# Patient Record
Sex: Male | Born: 1935 | Race: White | Hispanic: No | Marital: Married | Smoking: Never smoker
Health system: Southern US, Community
[De-identification: ages and names within clinical notes are randomized; demographics above are authoritative.]

## PROBLEM LIST (undated history)

## (undated) DIAGNOSIS — I10 Essential (primary) hypertension: Secondary | ICD-10-CM

## (undated) DIAGNOSIS — I2510 Atherosclerotic heart disease of native coronary artery without angina pectoris: Secondary | ICD-10-CM

## (undated) DIAGNOSIS — K4020 Bilateral inguinal hernia, without obstruction or gangrene, not specified as recurrent: Principal | ICD-10-CM

## (undated) DIAGNOSIS — E46 Unspecified protein-calorie malnutrition: Secondary | ICD-10-CM

## (undated) DIAGNOSIS — N259 Disorder resulting from impaired renal tubular function, unspecified: Secondary | ICD-10-CM

## (undated) DIAGNOSIS — J189 Pneumonia, unspecified organism: Secondary | ICD-10-CM

## (undated) DIAGNOSIS — I214 Non-ST elevation (NSTEMI) myocardial infarction: Secondary | ICD-10-CM

## (undated) DIAGNOSIS — E119 Type 2 diabetes mellitus without complications: Secondary | ICD-10-CM

## (undated) DIAGNOSIS — E785 Hyperlipidemia, unspecified: Secondary | ICD-10-CM

## (undated) DIAGNOSIS — A0472 Enterocolitis due to Clostridium difficile, not specified as recurrent: Secondary | ICD-10-CM

## (undated) DIAGNOSIS — M1990 Unspecified osteoarthritis, unspecified site: Secondary | ICD-10-CM

## (undated) HISTORY — DX: Disorder resulting from impaired renal tubular function, unspecified: N25.9

## (undated) HISTORY — PX: CORONARY ARTERY BYPASS GRAFT: SHX141

## (undated) HISTORY — PX: CATARACT EXTRACTION W/ INTRAOCULAR LENS  IMPLANT, BILATERAL: SHX1307

## (undated) HISTORY — DX: Hyperlipidemia, unspecified: E78.5

## (undated) HISTORY — PX: APPENDECTOMY: SHX54

## (undated) HISTORY — DX: Essential (primary) hypertension: I10

## (undated) HISTORY — PX: SCROTAL SURGERY: SHX2387

## (undated) HISTORY — PX: TONSILLECTOMY: SUR1361

## (undated) HISTORY — DX: Atherosclerotic heart disease of native coronary artery without angina pectoris: I25.10

## (undated) HISTORY — DX: Bilateral inguinal hernia, without obstruction or gangrene, not specified as recurrent: K40.20

---
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
## 2008-05-08 ENCOUNTER — Ambulatory Visit: Payer: Self-pay | Admitting: Cardiology

## 2008-05-13 ENCOUNTER — Ambulatory Visit: Payer: Self-pay | Admitting: Cardiology

## 2008-05-14 ENCOUNTER — Inpatient Hospital Stay (HOSPITAL_COMMUNITY): Admission: AD | Admit: 2008-05-14 | Discharge: 2008-05-16 | Payer: Self-pay | Admitting: Cardiology

## 2008-05-21 ENCOUNTER — Ambulatory Visit: Payer: Self-pay | Admitting: Cardiology

## 2008-05-21 ENCOUNTER — Inpatient Hospital Stay (HOSPITAL_COMMUNITY): Admission: EM | Admit: 2008-05-21 | Discharge: 2008-05-23 | Payer: Self-pay | Admitting: Cardiology

## 2008-06-11 ENCOUNTER — Ambulatory Visit: Payer: Self-pay | Admitting: Cardiology

## 2008-06-11 LAB — CONVERTED CEMR LAB
BUN: 43 mg/dL — ABNORMAL HIGH (ref 6–23)
CO2: 25 meq/L (ref 19–32)
GFR calc non Af Amer: 28 mL/min
Glucose, Bld: 117 mg/dL — ABNORMAL HIGH (ref 70–99)

## 2008-06-26 HISTORY — PX: CORONARY ANGIOPLASTY WITH STENT PLACEMENT: SHX49

## 2008-07-28 ENCOUNTER — Ambulatory Visit: Payer: Self-pay | Admitting: Cardiology

## 2008-12-11 DIAGNOSIS — E119 Type 2 diabetes mellitus without complications: Secondary | ICD-10-CM

## 2008-12-11 DIAGNOSIS — I10 Essential (primary) hypertension: Secondary | ICD-10-CM

## 2008-12-11 DIAGNOSIS — I2510 Atherosclerotic heart disease of native coronary artery without angina pectoris: Secondary | ICD-10-CM

## 2008-12-11 DIAGNOSIS — N259 Disorder resulting from impaired renal tubular function, unspecified: Secondary | ICD-10-CM

## 2008-12-11 DIAGNOSIS — E785 Hyperlipidemia, unspecified: Secondary | ICD-10-CM

## 2008-12-15 ENCOUNTER — Ambulatory Visit: Payer: Self-pay | Admitting: Cardiology

## 2008-12-17 ENCOUNTER — Telehealth (INDEPENDENT_AMBULATORY_CARE_PROVIDER_SITE_OTHER): Payer: Self-pay | Admitting: *Deleted

## 2009-01-07 ENCOUNTER — Encounter: Payer: Self-pay | Admitting: Cardiology

## 2009-01-12 ENCOUNTER — Encounter: Payer: Self-pay | Admitting: Cardiology

## 2009-03-12 ENCOUNTER — Encounter (INDEPENDENT_AMBULATORY_CARE_PROVIDER_SITE_OTHER): Payer: Self-pay | Admitting: *Deleted

## 2009-04-29 ENCOUNTER — Encounter: Payer: Self-pay | Admitting: Cardiology

## 2009-04-30 ENCOUNTER — Encounter: Payer: Self-pay | Admitting: Cardiology

## 2009-05-04 ENCOUNTER — Ambulatory Visit: Payer: Self-pay | Admitting: Cardiology

## 2009-05-05 ENCOUNTER — Telehealth: Payer: Self-pay | Admitting: Cardiology

## 2009-05-06 ENCOUNTER — Encounter: Payer: Self-pay | Admitting: Cardiology

## 2009-06-30 ENCOUNTER — Telehealth: Payer: Self-pay | Admitting: Cardiology

## 2009-11-01 ENCOUNTER — Ambulatory Visit: Payer: Self-pay | Admitting: Cardiology

## 2010-02-09 ENCOUNTER — Encounter: Payer: Self-pay | Admitting: Cardiology

## 2010-02-24 ENCOUNTER — Encounter: Payer: Self-pay | Admitting: Cardiovascular Disease

## 2010-02-25 ENCOUNTER — Encounter: Payer: Self-pay | Admitting: Cardiovascular Disease

## 2010-02-25 ENCOUNTER — Ambulatory Visit: Payer: Self-pay

## 2010-06-13 ENCOUNTER — Encounter: Payer: Self-pay | Admitting: Cardiology

## 2010-06-16 ENCOUNTER — Encounter: Payer: Self-pay | Admitting: Cardiology

## 2010-06-21 ENCOUNTER — Encounter: Payer: Self-pay | Admitting: Cardiology

## 2010-06-21 ENCOUNTER — Ambulatory Visit: Payer: Self-pay | Admitting: Cardiology

## 2010-07-28 NOTE — Miscellaneous (Signed)
  Clinical Lists Changes  Observations: Added new observation of RENAL USOUND: NOrmal caliber abdominal aorta Normal bilateral kidney size: right is smaller than previous study, and left is stable. Small right lower pole kidney cyst. No evidence of hydonephrosis is noted in the kidneys Normal renal arteries, bilaterally (02/25/2010 9:21)      Renal US  Procedure date:  02/25/2010  Findings:      NOrmal caliber abdominal aorta Normal bilateral kidney size: right is smaller than previous study, and left is stable. Small right lower pole kidney cyst. No evidence of hydonephrosis is noted in the kidneys Normal renal arteries, bilaterally

## 2010-07-28 NOTE — Letter (Signed)
Summary: [facility]   Imported By: Marylou Mccoy 07/13/2010 16:32:33  _____________________________________________________________________  External Attachment:    Type:   Image     Comment:   External Document

## 2010-07-28 NOTE — Letter (Signed)
Summary: [facility] Records   Imported By: Kassie Mends 06/29/2009 08:52:04  _____________________________________________________________________  External Attachment:    Type:   Image     Comment:   External Document

## 2010-07-28 NOTE — Miscellaneous (Signed)
Summary: Orders Update  Clinical Lists Changes  Orders: Added new Service order of EKG w/ Interpretation (93000) - Signed 

## 2010-07-28 NOTE — Consult Note (Signed)
Summary: [facility]  [facility]   Imported By: Marylou Mccoy 07/10/2009 10:00:02  _____________________________________________________________________  External Attachment:    Type:   Image     Comment:   External Document

## 2010-07-28 NOTE — Miscellaneous (Signed)
  Clinical Lists Changes  Observations: Added new observation of PI CARDIO: Your physician recommends that you schedule a follow-up appointment in: 6 months with Dr Antoine Poche Your physician recommends that you continue on your current medications as directed. Please refer to the Current Medication list given to you today. (11/01/2009 16:37)      Patient Instructions: 1)  Your physician recommends that you schedule a follow-up appointment in: 6 months with Dr Antoine Poche 2)  Your physician recommends that you continue on your current medications as directed. Please refer to the Current Medication list given to you today.

## 2010-07-28 NOTE — Progress Notes (Signed)
Summary: QUESTION ABOUT HAVING DENTAL WORK   Phone Note Call from Patient Call back at Home Phone [phone]   Caller: Spouse/ANNE Summary of Call: PT HAVING DENTAL WORK AND WANT TO KNOW IF THE PT NEEDS MEDICATION BEFORE DENTAL WORK. Initial call taken by: Judie Grieve,  June 30, 2009 10:24 AM  Follow-up for Phone Call        No SBE needed, pt aware Follow-up by: Charolotte Capuchin, RN,  June 30, 2009 10:31 AM

## 2010-07-28 NOTE — Miscellaneous (Signed)
Summary: Orders Update  Clinical Lists Changes  Orders: Added new Test order of Renal Artery Duplex (Renal Artery Duplex) - Signed 

## 2010-07-28 NOTE — Assessment & Plan Note (Signed)
Summary: 6 month rov/sl      Allergies Added: NKDA  Visit Type:  Follow-up Primary Provider:  Dr. Omer Jack  CC:  CAD.  History of Present Illness: The patient presents for followup. Since I last saw him he has had no new cardiovascular complaints. He does occasionally get some chest discomfort when he first pushes his lawnmower. However, this is sporadic and not always reproducible with this activity. He can use a weedeater and I get these symptoms. He can pedal his bicycle for 20 minutes getting his heart rate elevated and does not bring on the symptoms. He has on rare occasions stopped pushing a lawnmower and taken a nitroglycerin. He said he otherwise feels well. He's not having any resting symptoms. He's having no palpitations, presyncope or syncope. He's had no PND or orthopnea.  Current Medications (verified): 1)  Imdur 120 Mg Xr24h-Tab (Isosorbide Mononitrate) .... Daily 2)  Aspirin 325 Mg  Tabs (Aspirin) .... Daily 3)  Fish Oil   Oil (Fish Oil) .... Daily 4)  Cod Liver Oil   Oil (Cod Liver Oil) .... Daily 5)  Flomax 0.4 Mg Xr24h-Cap (Tamsulosin Hcl) .... Daily 6)  Plavix 75 Mg Tabs (Clopidogrel Bisulfate) .... Daily 7)  Crestor 5 Mg Tabs (Rosuvastatin Calcium) .... Daily 8)  Icaps Lutein-Zeaxanthin  Cr-Tabs (Specialty Vitamins Products) .... Once Daily 9)  Cinnamon 500 Mg Caps (Cinnamon) .... Once Daily 10)  Metoprolol Tartrate 100 Mg Tabs (Metoprolol Tartrate) .... One Tablet Every Am and 1/2 Tablet Every Pm  Allergies (verified): No Known Drug Allergies  Past History:  Past Medical History: Coronary artery disease (the patient had a   previous CABG in 1997 with a LIMA to the LAD, SVG to OM and SVG to PDA.   Last catheterization was by Dr. Juanda Chance in November.  He had an LAD,   which was occluded.  The circumflex demonstrated occlusion of a second   posterolateral branch 90% followed by 80% stenosis in a large   posterolateral branch, 50% stenosis of the first marginal.   The right   coronary artery had a 90% stenosis extending from the ostium to the   posterior branch.  The saphenous vein graft to the right coronary artery   was occluded at the origin.  The LIMA to the LAD was patent.  The vein   graft to the circumflex was occluded.  EF was 58%.  The patient had a   PROMUS drug eluting stent of the circumflex.), chronic renal   insufficiency (probably related to diabetes), hyperlipidemia,   hypertension, type 2 diabetes mellitus  Hernia - (groin area)  Past Surgical History: Reviewed history from 12/15/2008 and no changes required. Appendectomy CABG  Review of Systems       As stated in the HPI and negative for all other systems.   Vital Signs:  Patient profile:   77 year old male Height:      68 inches Weight:      180 pounds BMI:     27.47 Pulse rate:   66 / minute Resp:     16 per minute BP sitting:   134 / 80  (right arm)  Vitals Entered By: Marrion Coy, CNA (Nov 01, 2009 3:37 PM)  Physical Exam  General:  Well developed, well nourished, in no acute distress. Head:  normocephalic and atraumatic Eyes:  PERRLA/EOM intact; conjunctiva and lids normal. Neck:  Neck supple, no JVD. No masses, thyromegaly or abnormal cervical nodes. Chest Wall:  well healed  sternotomy scar Lungs:  Clear bilaterally to auscultation and percussion. Abdomen:  Bowel sounds positive; abdomen soft and non-tender without masses, organomegaly, or hernias noted. No hepatosplenomegaly. Msk:  Back normal, normal gait. Muscle strength and tone normal. Extremities:  No clubbing or cyanosis. Neurologic:  Alert and oriented x 3. Skin:  Intact without lesions or rashes. Cervical Nodes:  no significant adenopathy Inguinal Nodes:  no significant adenopathy Psych:  Normal affect.   Detailed Cardiovascular Exam  Neck    Carotids: Carotids full and equal bilaterally without bruits.      Neck Veins: Normal, no JVD.    Heart    Inspection: no deformities or lifts  noted.      Palpation: normal PMI with no thrills palpable.      Auscultation: regular rate and rhythm, S1, S2 without murmurs, rubs, gallops, or clicks.    Vascular    Abdominal Aorta: no palpable masses, pulsations, or audible bruits.      Femoral Pulses: normal femoral pulses bilaterally.      Pedal Pulses: normal pedal pulses bilaterally.      Radial Pulses: normal radial pulses bilaterally.      Peripheral Circulation: no clubbing, cyanosis, or edema noted with normal capillary refill.     EKG  Procedure date:  11/01/2009  Findings:      sinus rhythm, rate 66, left axis deviation, early transition in lead V2 questionable lead placement, no acute ST-T wave changes  Impression & Recommendations:  Problem # 1:  CAD (ICD-414.00) The patient has some atypical chest discomfort. If this is angina it is exertional and stable. He otherwise is doing quite well. I would continue with medical management.  Problem # 2:  HYPERTENSION (ICD-401.9) His blood pressure is well controlled. He will continue the meds as listed.  Problem # 3:  HYPERLIPIDEMIA (ICD-272.4) He reports that his cholesterol was recently well controlled and he will try to send these results to me. We have discussed the goal LDL less than 100 and HDL greater than 40.

## 2010-07-28 NOTE — Assessment & Plan Note (Signed)
Summary: 6 month 414.01  pfh,rn  Medications Added METOPROLOL TARTRATE 100 MG TABS (METOPROLOL TARTRATE) 1/2 by mouth two times a day      Allergies Added: NKDA  Visit Type:  Follow-up Primary Provider:  Dr. Mikey Bussing  CC:  CAD.  History of Present Illness: The patient presents for followup of his known coronary disease. Since I last saw him he has had no new cardiovascular complaints. He still gets episodes when he is lifting something light refill suddenly feels fatigued and short of breath. He might take a nitroglycerin and symptoms resolved over about 5 minutes. This is not sporadic and is not reproducible only when he lifts light objects. He has not had this when he carries heavy objects when he fills his stationary bicycle. He denies any chest pressure, neck or arm discomfort. He has no new PND or orthopnea. He has no palpitations, presyncope or syncope.  Current Medications (verified): 1)  Imdur 120 Mg Xr24h-Tab (Isosorbide Mononitrate) .... Daily 2)  Aspirin 325 Mg  Tabs (Aspirin) .... Daily 3)  Fish Oil   Oil (Fish Oil) .... Daily 4)  Cod Liver Oil   Oil (Cod Liver Oil) .... Daily 5)  Flomax 0.4 Mg Xr24h-Cap (Tamsulosin Hcl) .... Daily 6)  Plavix 75 Mg Tabs (Clopidogrel Bisulfate) .... Daily 7)  Crestor 5 Mg Tabs (Rosuvastatin Calcium) .... Daily 8)  Icaps Lutein-Zeaxanthin  Cr-Tabs (Specialty Vitamins Products) .... Once Daily 9)  Cinnamon 500 Mg Caps (Cinnamon) .... Once Daily 10)  Metoprolol Tartrate 100 Mg Tabs (Metoprolol Tartrate) .... 1/2 By Mouth Two Times A Day 11)  Nitrostat 0.4 Mg Subl (Nitroglycerin) .Marland Kitchen.. 1 Tablet Under Tongue At Onset of Chest Pain; You May Repeat Every 5 Minutes For Up To 3 Doses.  Allergies (verified): No Known Drug Allergies  Past History:  Past Medical History: Reviewed history from 11/01/2009 and no changes required. Coronary artery disease (the patient had a   previous CABG in 1997 with a LIMA to the LAD, SVG to OM and SVG to PDA.   Last catheterization was by Dr. Juanda Chance in November.  He had an LAD,   which was occluded.  The circumflex demonstrated occlusion of a second   posterolateral branch 90% followed by 80% stenosis in a large   posterolateral branch, 50% stenosis of the first marginal.  The right   coronary artery had a 90% stenosis extending from the ostium to the   posterior branch.  The saphenous vein graft to the right coronary artery   was occluded at the origin.  The LIMA to the LAD was patent.  The vein   graft to the circumflex was occluded.  EF was 58%.  The patient had a   PROMUS drug eluting stent of the circumflex.), chronic renal   insufficiency (probably related to diabetes), hyperlipidemia,   hypertension, type 2 diabetes mellitus  Hernia - (groin area)  Past Surgical History: Reviewed history from 12/15/2008 and no changes required. Appendectomy CABG  Review of Systems       Pain from a inguinal hernia. Otherwise as stated in the history of present illness negative for all other systems.  Vital Signs:  Patient profile:   77 year old male Height:      68 inches Weight:      181 pounds BMI:     27.62 Pulse rate:   68 / minute Resp:     16 per minute BP sitting:   124 / 70  (right arm)  Vitals Entered  By: Marrion Coy, CNA (June 21, 2010 3:08 PM)  Physical Exam  General:  Well developed, well nourished, in no acute distress. Head:  normocephalic and atraumatic Neck:  Neck supple, no JVD. No masses, thyromegaly or abnormal cervical nodes. Chest Wall:  well healed sternotomy scar Lungs:  Clear bilaterally to auscultation and percussion. Heart:  Non-displaced PMI, chest non-tender; regular rate and rhythm, S1, S2 without murmurs, rubs or gallops. Carotid upstroke normal, no bruit. Normal abdominal aortic size, no bruits. Femorals normal pulses, no bruits. Pedals normal pulses. No edema, no varicosities. Abdomen:  Bowel sounds positive; abdomen soft and non-tender without masses,  organomegaly, or hernias noted. No hepatosplenomegaly. Msk:  Back normal, normal gait. Muscle strength and tone normal. Extremities:  No clubbing or cyanosis. Neurologic:  Alert and oriented x 3. Skin:  Intact without lesions or rashes. Cervical Nodes:  no significant adenopathy Axillary Nodes:  no significant adenopathy Inguinal Nodes:  no significant adenopathy Psych:  Normal affect.   EKG  Procedure date:  06/21/2010  Findings:      Sinus rhythm, rate 68, left axis deviation, intervals within normal limits, no acute ST-T wave changes.  Impression & Recommendations:  Problem # 1:  CAD (ICD-414.00) At this point I do not have any strong suspicion that he has new vascular disease or new unstable angina. He will continue with medical management and risk reduction. Orders: EKG w/ Interpretation (93000)  Problem # 2:  HYPERTENSION (ICD-401.9) His blood pressure is well controlled. We will continue with the meds as listed.  Problem # 3:  RENAL INSUFFICIENCY (ICD-588.9) This is stable apparently and followed by nephrology.  Problem # 4:  HYPERLIPIDEMIA (ICD-272.4) This is followed by his primary physician. The goal is LDL less than 70 and HDL greater than 40.  Patient Instructions: 1)  Your physician recommends that you schedule a follow-up appointment in: 6 months with Dr Antoine Poche 2)  Your physician recommends that you continue on your current medications as directed. Please refer to the Current Medication list given to you today. 3)  Dr Avel Peace  [phone]

## 2010-07-28 NOTE — Letter (Signed)
Summary: [facility] Patient Note   [facility] Patient Note   Imported By: Roderic Ovens 03/25/2010 11:22:13  _____________________________________________________________________  External Attachment:    Type:   Image     Comment:   External Document

## 2010-11-08 NOTE — Discharge Summary (Signed)
NAMERENLEY, BANWART NO.:  [id number]   MEDICAL RECORD NO.:  [id number]          PATIENT TYPE:  INP   LOCATION:  6526                         FACILITY:  [facility]   PHYSICIAN:  Duke Salvia, MD, FACCDATE OF BIRTH:  [birth year]   DATE OF ADMISSION:  05/21/2008  DATE OF DISCHARGE:  05/23/2008                               DISCHARGE SUMMARY   PRIMARY CARDIOLOGIST:  [name], MD, [facility]   PRIMARY CARE Mads Borgmeyer:  Dr. Wallace Cullens   DISCHARGE DIAGNOSES:  Unstable angina/coronary artery disease.   SECONDARY DIAGNOSES:  1. Hypertension.  2. Hyperlipidemia.  3. Type 2 diabetes mellitus (diet controlled).  4. Status post appendectomy.   ALLERGIES:  No known drug allergies.   PROCEDURES:  Successful percutaneous coronary intervention and stenting  of the native left circumflex with placement of a 2.5 x 20 mm PROMUS  drug-eluting stent and a 2.75 x 8 mm PROMUS drug-eluting stent.   HISTORY OF PRESENT ILLNESS:  This is a 77 year old Caucasian male with  prior history of CAD status post CABG x3 in 1997 who recently began to  experience episodes of overwhelming fatigue, which he identified as his  anginal equivalent.  He subsequently followed up with Dr. Omer Jack and  underwent stress testing revealing inferior ischemia with no LV  function.  He then saw Dr. Antoine Poche and underwent cardiac  catheterization on May 15, 2008, revealing only 1/3 patent grafts  (the LIMA to the LAD) with, otherwise, severe native vessel disease,  specifically involving the PDA and a large posterolateral branch of the  left circumflex.  The posterolateral branch of the left circumflex was  felt to be the most amenable to PCI.  Because of the patient's renal  dysfunction, it was felt that a stage procedure was the best approach  and the patient was discharged on May 16, 2008, and readmitted on  May 21, 2008.   HOSPITAL COURSE:  Following admission, Mr. Consalvo was  aggressively  hydrated in order to prevent contrast-induced nephropathy.  He was taken  to the Cath Lab on May 22, 2008, and underwent successful PCI and  stenting of the mid and distal left circumflex with placement of 2  PROMUS drug-eluting stents.  The patient tolerated the procedure well,  and postprocedure, his creatinine is down to 2.16.  He has been  ambulating without recurrent symptoms or limitation.  He will be  discharged home today in good condition.   DISCHARGE LABORATORY DATA:  Hemoglobin 11.8, hematocrit 34.5, WBCs 6.9,  platelets 185.  Sodium 138, potassium 4.5, chloride 109, CO2 21, BUN 28,  creatinine 2.16, glucose 108, total bilirubin 0.6.  Alkaline phosphatase  253, AST 34, ALT 37, total protein 4.4, albumin 1.2, calcium 8.9.   DISPOSITION:  The patient will be discharged home today in good  condition.   FOLLOWUP PLANS AND APPOINTMENTS:  We will arrange for followup with Dr.  Antoine Poche in approximately 2 weeks.  We have asked him to follow up with  Dr. Omer Jack in 1 week for a repeat BMET.   DISCHARGE MEDICATIONS:  1. Aspirin 325 mg daily.  2. Plavix 75  mg daily.  3. Lopressor 25 mg b.i.d.  4. Crestor 5 mg daily.  5. Flomax 0.4 mg daily.  6. Imdur 120 mg daily.  7. Nitroglycerin 0.4 mg sublingual p.r.n. chest pain.  8. Micardis 80 mg daily.   OUTSTANDING LABORATORY STUDIES:  A BMET in 1 week.   DURATION OF DISCHARGE ENCOUNTER:  60 minutes including physician time.      Nicolasa Ducking, ANP      Duke Salvia, MD, [facility]  Electronically Signed    CB/MEDQ  D:  05/23/2008  T:  05/23/2008  Job:  [id number]

## 2010-11-08 NOTE — Cardiovascular Report (Signed)
NAMELAN, MCNEILL            ACCOUNT NO.:  [id number]   MEDICAL RECORD NO.:  [id number]          PATIENT TYPE:  INP   LOCATION:  2039                         FACILITY:  [facility]   PHYSICIAN:  Bruce R. Juanda Chance, MD, FACCDATE OF BIRTH:  1927-10-02   DATE OF PROCEDURE:  05/15/2008  DATE OF DISCHARGE:                            CARDIAC CATHETERIZATION   PAST MEDICAL HISTORY:  Mr. Osten is a 77 year old and had bypass  surgery in 1997.  Recently, he developed symptoms of exertional fatigue  and had a Myoview scan, which showed inferior ischemia.  He has chronic  renal insufficiency.  He has creatinine that ranges 2.3-2.6.  It was 2.3  this morning after hydration.  He was seen in consultation by Renal  Service prior to the cath today.   PROCEDURE:  The procedure was performed by the right femoral arterial  sheath and 5-French preformed coronary catheters.  A front wall arterial  puncture with a following Omnipaque contrast was used.  A LIMA catheter  was used for injection of LIMA graft.  No left ventriculogram was  performed.  We made every attempt to minimize contrast and the total  contrast used was 42 mL.   RESULTS:  The left main coronary artery was free of significant disease.   Left anterior descending artery.  Please note that left descending  artery gave rise to a septal perforator and a diagonal branch.  Vessel  was then completely occluded.   The circumflex artery:  The circumflex artery gave rise to a first  marginal branch and then a large posterolateral branch.  There may have  been occlusion of a second posterolateral branch since this was filled  via collaterals from the right coronary artery.  There was a 90% and 80%  tandem lesions in the large posterolateral branch.  There was 50% ostial  narrowing in the first marginal branch.   The right coronary artery:  Right coronary artery is moderately large  vessel, gave rise to 2 right ventricle branch, a posterior  descending  branch, and 2 posterolateral branches.  There was a long area of 90%  stenosis extending from the ostium to about two-thirds the way down the  posterior branch.  There was 50% narrowing in the distal right coronary  artery just before the 2 posterolateral branches and the caliber vessel  change at that point to a smaller caliber vessel.   The saphenous vein graft of the right coronary artery was simply  occluded near its origin.  I could not be certain if this was an old or  recent occlusion.   The circumflex artery:  The circumflex artery was completely occluded at  its origin.  This look to be an old occlusion.   The LIMA graft to the LAD was patent and functioning normally.  This  also filled the diagonal branch.  No left ventriculogram was performed.   The aortic pressure was 101/49 with mean of 69.  Left neck pressure is  101/8.   CONCLUSION:  1. Coronary artery, he is status post coronary bypass graft surgery in      1997.  2. Severe native vessel disease with total occlusion left anterior      descending artery, 50% stenosis in the first marginal branch of the      circumflex artery and 90% and 80% tandem stenoses in the large      posterolateral branch and complete occlusion of the very distal      circumflex artery.  3. A 40% narrowing in the distal right coronary artery with long area      of 90% stenosis in the posterior descending branch of the right      coronary artery.  4. Occluded vein graft to the right coronary, occluded vein graft to      circumflex artery, and patent LIMA graft to the LAD.  5. Ejection fraction of 58% by recent Myoview scan.   RECOMMENDATIONS:  The patient has a creatinine of 2.3.  We used 42 mL of  contrast.  The decision about intervention was difficult.  I think, the  posterior descending lesion is not very favorable for intervention and  also it might require a large amount of contrast to fix.  The tandem  lesions in the  large posterolateral branch of circumflex artery, I  think, could be flexible with a long drug-eluting stent.  We might  consider this and so I think this can be done without very much  contrast.  I will discuss these findings with Dr. Antoine Poche and then we  will make a decision about how to proceed.      Bruce Elvera Lennox Juanda Chance, MD, [facility]  Electronically Signed     BRB/MEDQ  D:  05/15/2008  T:  05/16/2008  Job:  [id number]   cc:   [name], MD, [facility]  CardioPulmonary Laboratory  Wallace Cullens

## 2010-11-08 NOTE — Assessment & Plan Note (Signed)
[facility]                            CARDIOLOGY OFFICE NOTE   NAME:[name] [name], [name] [name]                     MRN:          [id number]  DATE:06/11/2008                            DOB:          Dec 14, 1927    PRIMARY CARE PHYSICIAN:  Wallace Cullens, MD   REASON FOR PRESENTATION:  Evaluate the patient with coronary artery  disease.   HISTORY OF PRESENT ILLNESS:  The patient presents for followup after  recent angioplasty stent.  He had this done by Dr. Juanda Chance.  I cathed him  for evaluation of an abnormal Cardiolite demonstrating inferior  ischemia.  He also had some dyspnea and chest pain.  This  catheterization demonstrated the LAD was occluded, the circumflex had an  occlusion of the second posterolateral branch, 90% followed by 80%  lesion in a large posterolateral branch, 50% stenosis of first marginal.  The right coronary artery had a long area of 90% stenosis extending from  the ostium, the posterior branch.  Saphenous vein graft to the right  coronary artery was occluded at the origin.  Circumflex or the LIMA to  the LAD was patent.  The vein graft to the circumflex was occluded.  The  EF was 58%.  Because of renal insufficiency, we staged his procedure.  He came back and had PCI of mid circumflex lesion with a PROMUS drug-  eluting stent and PCI of the circumflex ostial lesion with PROMUS drug-  eluting stent.  We followed his creatinine, which has been slightly  elevated from his baseline.   Since going home he has had no new problems.  He has had none of the  significant fatigue that he was having.  He still getting some mild  chest discomfort going up and down stairs.  He is not having any resting  chest discomfort.  He is not having any new shortness of breath.  He is  not describing any PND or orthopnea.  He has had no palpitation,  presyncope, or syncope.  He has been bothered by a head cold.   PAST MEDICAL HISTORY:  Coronary artery disease  as described above,  chronic renal insufficiency (probably related to diabetes,  hyperlipidemia, and hypertension), hypertension, hyperlipidemia, type 2  diabetes mellitus, and appendectomy.   ALLERGIES AND INTOLERANCES:  None.   MEDICATIONS:  1. Aspirin 325 mg daily.  2. Fish oil.  3. Cod liver oil.  4. Flomax 0.4 mg daily.  5. Plavix 75 mg daily.  6. Crestor 5 mg daily.  7. Imdur 120 mg daily.  8. Metoprolol 25 mg b.i.d.  9. Micardis is on hold.   REVIEW OF SYSTEMS:  As stated in the HPI, otherwise, negative for other  systems except for upper respiratory cold.   PHYSICAL EXAMINATION:  GENERAL:  The patient is in no distress.  VITAL SIGNS:  Blood pressure 144/80, heart rate 81 and regular, weight  181 pounds, and body mass index 27.  HEENT:  Eyes are unremarkable, pupils equal, round, and reactive to  light, fundi not visualized, oral mucosa unremarkable.  NECK:  No jugular venous distention at 45 degrees,  carotid upstroke  brisk and symmetrical.  No bruits or thyromegaly.  LYMPHATICS:  No cervical, axillary, or inguinal adenopathy.  LUNGS:  Clear to auscultation bilaterally.  BACK:  No costovertebral angle tenderness.  CHEST:  Unremarkable.  HEART:  PMI not displaced or sustained, S1 and S2 within normal limits,  no S3, no S4, no clicks, rubs, no murmurs.  ABDOMEN:  Flat, positive bowel sounds normal in frequency and pitch, no  bruits, no rebound, no guarding, no midline pulsatile mass, no  hepatomegaly, no splenomegaly.  SKIN:  No rashes,  no nodules.  EXTREMITIES:  2+ pulses, no edema.   EKG sinus rhythm, rate 81, axis within normal limits, intervals within  normal limits, no acute ST-wave changes.   ASSESSMENT AND PLAN:  1. Coronary artery disease.  The patient had percutaneous coronary      intervention to the circumflex as described.  He does have some      discomfort.  His fatigue is improved.  I doubt that he has had      restenosis based on his symptoms.  I  would have a very high      threshold to do another catheterization on this gentleman with his      renal insufficiency.  We will continue to manage him medically and      with risk reduction.  2. Renal insufficiency, we will check creatinine today.  I am going to      hold his Micardis.  We will set him up to follow long-term with one      of our nephrologists.  3. Diabetes per Dr. Omer Jack.  4. Dyslipidemia per Dr. Omer Jack.  I will be happy to follow this as      well.  The goal will be an LDL less than 70 and HDL greater than      40.  5. Hypertension.  Blood pressure is slightly elevated.  I am going to      hope to restart Micardis if his creatinine is stable at some point      in time, if not, we will titrate his other meds as tolerated and      indicated.  6. Followup.  I am going to see the patient back in 6 weeks or sooner      if needed.     [name], MD, [facility]  Electronically Signed    JH/MedQ  DD: 06/11/2008  DT: 06/11/2008  Job #: [id number]   cc:   Wallace Cullens

## 2010-11-08 NOTE — H&P (Signed)
NAME:  [name] [name], [name] [name] NO.:  [id number]   MEDICAL RECORD NO.:  [id number]          PATIENT TYPE:  INP   LOCATION:  3736                         FACILITY:  [facility]   PHYSICIAN:  [name], MD, FACCDATE OF BIRTH:  13-May-1928   DATE OF ADMISSION:  05/21/2008  DATE OF DISCHARGE:                              HISTORY & PHYSICAL   PRIMARY CARDIOLOGY:  [name], MD, [facility]   PRIMARY CARE Jenea Dake:  Dr. Wallace Cullens   PROFILE:  A 77 year old married Caucasian male with prior history of  CABG and CAD who presents for PCI tomorrow.   PROBLEMS:  1. Unstable angina/coronary artery disease.      a.     CABG x3 in 1997 with placement of a LIMA to the LAD, vein       graft to the RCA, and vein graft to the left circumflex.      b.     November 2009, exercise Myoview showing EF of 58% with       inferior ischemia.      c.     May 15, 2008, cardiac catheterization left main normal,       LAD 100%, large posterolateral branch of 90 and 80% tandem       stenoses.  OM1 50%, RCA 50%, distal PDA 90%, ostial to mid, vein       graft to the RCA was occluded.  Vein graft to the circumflex was       occluded.  LIMA to the LAD was patent.  2. Hypertension.  3. Hyperlipidemia.  4. Type 2 diabetes mellitus.  5. Status post appendectomy.   HISTORY OF PRESENT ILLNESS:  A 77 year old Caucasian male with history  of CAD status post CABG in 1997.  Recently began to experience episodes  of overwhelming fatigue which he identifies as an anginal equivalent.  He underwent Myoview stress testing work by Dr. Omer Jack revealing  inferior ischemia with normal LV function.  Subsequently, he saw Dr.  Antoine Poche in the office, May 08, 2008 and was sent for cardiac  catheterization.  Catheterization which was performed on May 15, 2008, showed only one of three patent grafts (the LIMA to the LAD) and  severe native vessel disease.  He had significant disease in the PDA, as  well  as the left posterolateral branch.  It was felt that the PDA was  the most amenable to PCI.  Notably, the patient also has history of  chronic kidney disease and for that reason, PCI was not performed  following catheterization, May 15, 2008, and instead the patient  was discharged to home.  His Micardis has been held since discharge and  he presents back this evening for hydration with plans for PCI of the  PDA tomorrow.  Since discharge, [name] [name] has had one additional  episode of overwhelming fatigue that occurred when he was walking out to  get the newspaper.  This was short-lived.   ALLERGIES:  No known drug allergies.   HOME MEDICATIONS:  1. Plavix 75 mg daily.  2. Aspirin 325 mg daily.  3. Lopressor 25 mg  b.i.d.  4. Crestor 5 mg daily.  5. Flomax 0.4 mg daily.  6. Imdur 120 mg daily.  7. Nitroglycerin p.r.n.  8. Also takes Micardis which is currently on hold.   FAMILY HISTORY:  Noncontributory for early coronary artery disease.   SOCIAL HISTORY:  He lives in [place] with his wife.  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  He has 3 grown  children.  He denies tobacco, alcohol, or drug use and is not routinely  exercising.   REVIEW OF SYSTEMS:  Positive for an episode of an overwhelming fatigue  which he described as an anginal equivalent.  Otherwise, all systems  reviewed are negative.   PHYSICAL EXAMINATION:  GENERAL:  He is afebrile.  VITAL SIGNS:  Heart rate 72, respirations 16, his blood pressure is  138/82, pulse ox 98% on room air.  Pleasant white male in no acute  distress.  Awake and oriented x3.  HEENT:  Normal nares grossly intact, nonfocal.  SKIN:  Warm and dry without lesions or masses.  MUSCULOSKELETAL:  Without obvious deformity effusion.  He has good range  motion in upper and lower extremities.  NECK:  No bruits, JVD.  LUNGS:  Respirations regular, unlabored.  Clear to auscultation.  CARDIAC:  Regular S1 and  S2.  No S3, S4, murmurs.  ABDOMEN:  Round, soft, nontender, nondistended.  Bowel sounds present.  EXTREMITIES:  Warm and dry, pink.  No clubbing, cyanosis, or edema.  Dorsalis pedis, posterior tibial pulses 2+ and equal bilaterally.   EKG is pending.  Lab work is pending.   ASSESSMENT AND PLAN:  1. Unstable angina/coronary artery disease.  The patient presents for      hydration tonight and PCI of the right PDA tomorrow.  Continue home      meds with exception of Micardis.  2. Chronic kidney disease.  Followup BMET.  Hydrate Mucomyst.  3. Hypertension.  He is off on Micardis for now.  4. Hyperlipidemia.  Continue Crestor therapy.  5. Diabetes mellitus.  Diet controlled at home.      Nicolasa Ducking, ANP      [name], MD, [facility]  Electronically Signed    CB/MEDQ  D:  05/21/2008  T:  05/21/2008  Job:  [id number]

## 2010-11-08 NOTE — Assessment & Plan Note (Signed)
[name] [name]                            CARDIOLOGY OFFICE NOTE   NAME:[name] [name]                     MRN:          [id number]  DATE:05/08/2008                            DOB:          Jul 21, 1927    PRIMARY CARE PHYSICIAN:  Dr. Wallace Cullens.   REASON FOR PRESENTATION:  A patient with fatigue and an abnormal stress  test.  He has a history of CABG.   HISTORY OF PRESENT ILLNESS:  The patient is a very pleasant 77 year old  gentleman, who was seen here in 1997.  At that time, he had coronary  artery disease as described below.  He underwent CABG by Dr. Laneta Simmers.  He  did very well and was followed by Dr. Omer Jack for all these years.  He  has had good primary risk reduction.  He has not required any further  stress testing.  Of note, at the time of his CABG, his predominant  symptom was dyspnea.   He, now has noticed over the last 2-3 weeks, increased fatigue.  This  has been somewhat sporadic, however, with some activity such as pushing  the wheelbarrow, he has noticed that he has been very tired and unable  to do that.  This is unusual for him.  It comes on suddenly.  The  fatigue wears off after about 5 minutes.  He can pedal a stationary  bicycle for 3 miles without bringing these symptoms on.  At the same  time, when he goes to pick up a 10 or 15 pounds weight, he may develop  these symptoms.  This is clearly a change in his status.  He is not  having any chest pressure, neck or arm discomfort.  He is not having any  dyspnea such as he had in 1997.  He is not having any resting shortness  of breath, PND, or orthopnea.   Dr. Omer Jack did send him for a stress perfusion study.  The images  demonstrated an EF of 58%.  However, there was an area of inferior  ischemia.  He is now referred for further evaluation.   PAST MEDICAL HISTORY:  1. Hypertension x12 years.  2. Hyperlipidemia.  3. Type 2 diabetes mellitus, diet controlled.  4. Coronary  artery disease (catheterization in 1997 demonstrated      normal left main, LAD 95% stenosis, circumflex ostial 50% stenosis,      mid circumflex 70% stenosis, OM-1 70% stenosis, right coronary      artery to PDA mid 90% stenosis.  Well-preserved ejection      fraction.).   PAST SURGICAL HISTORY:  1. Appendectomy.  2. CABG in 1997 with a LIMA to the LAD, SVG to OM, and SVG to PDA.   ALLERGIES:  None.   MEDICATIONS:  1. Crestor 5 mg daily.  2. Flomax.  3. Micardis 80 mg daily.  4. Isosorbide 30 mg daily.   SOCIAL HISTORY:  The patient works at [employer].  He is married.  He  has 3 children with a grandchildren.  He does not smoke cigarettes.  He  does not drink alcohol.  FAMILY HISTORY:  Noncontributory for early coronary artery disease.   REVIEW OF SYSTEMS:  As stated in the HPI and positive for benign  prostatic hypertrophy with some mild difficulty starting his stream of  urine.  Negative for all other systems.   PHYSICAL EXAMINATION:  GENERAL:  The patient is very pleasant and in no  distress.  VITAL SIGNS:  Blood pressure 118/74, heart 77 and regular, weight 180  pounds, and body mass index 27.  HEENT:  Eyes unremarkable, pupils are equal, round, and reactive to  light.  Fundi not well visualized.  Oral mucosa, unremarkable.  NECK:  No jugular vein distention at 45 degrees.  Carotid upstrokes  brisk and symmetric.  No bruits.  No thyromegaly.  LYMPHATICS:  No cervical, axillary, or inguinal adenopathy.  LUNGS:  Clear to auscultation bilaterally.  BACK:  No costovertebral angle tenderness.  CHEST:  A well-healed sternotomy scar.  HEART:  PMI not displaced or sustained, S1 and S2 within normal limits.  No S3.  No S4.  No clicks.  No rubs.  No murmurs.  ABDOMEN:  Flat, positive bowel sounds normal in frequency and pitch.  No  bruits, rebound, guarding, or midline pulsatile mass.  No hepatomegaly.  No splenomegaly.  SKIN:  No rashes.  No nodules.  EXTREMITIES:   Pulses 2+ throughout.  No edema, cyanosis, or clubbing.  NEUROLOGIC:  Oriented to person, place, and time.  Cranial nerves II  through XII grossly intact.  Motor grossly intact.    EKG, sinus rhythm with premature atrial contractions, left axis  deviation, and no acute ST-T wave changes.   ASSESSMENT/PLAN:  1. Coronary artery disease.  The patient has sudden onset of      exertional fatigue.  This well could represent an anginal      equivalent.  He has a Cardiolite suggesting inferior ischemia.  I      think the possibility he has problems particularly with vein graft      to his right coronary artery is very high.  Pretest probability of      obstructive coronary artery disease contributing to his symptoms is      extremely high.  Therefore, the next step should be cardiac      catheterization.  I have explained this to the patient and his      wife.  They understand the risks including stroke, heart attack,      death, dye allergy, thromboembolic event, vascular trauma,      infection, renal insufficiency, bleeding, and bruising.  They agree      to proceed and we will arrange this in the outpatient clinic.  I am      going to start metoprolol 25 mg twice a day.  He will continue      other medicines as listed.  He will be given sublingual      nitroglycerin.  He is instructed to come to the emergency room for      any progressive symptoms.  2. Hypertension.  Blood pressure is controlled on the meds as listed      and he will continue these.  3. Dyslipidemia, per Dr. Omer Jack with a goal LDL less than 100 and      HDL greater than 70.  4. Diabetes, per Dr. Omer Jack.  5. Followup will be at the time of his catheterization.     [name], MD, [facility]  Electronically Signed    JH/MedQ  DD: 05/08/2008  DT: 05/09/2008  Job #: [id number]   cc:   Wallace Cullens

## 2010-11-08 NOTE — Discharge Summary (Signed)
[name], OFARRELL NO.:  [id number]   MEDICAL RECORD NO.:  [id number]          PATIENT TYPE:  INP   LOCATION:  2039                         FACILITY:  [facility]   PHYSICIAN:  Hillis Range, MD       DATE OF BIRTH:  [birth year]   DATE OF ADMISSION:  05/13/2008  DATE OF DISCHARGE:  05/16/2008                               DISCHARGE SUMMARY   DISCHARGING DIAGNOSES:  1. Coronary artery disease status post cardiac catheterization this      admission on May 15, 2008, by Dr. Charlies Constable.  The patient      is pending percutaneous coronary intervention circumflex on      May 22, 2008.  The patient is to return on May 21, 2008,      for intravenous hydration.  2. Chronic renal insufficiency with a stable creatinine of 2.2 at time      of discharge status post renal consult this admission.  The patient      with chronic kidney disease stage III-stage IV.  Recommend      intravenous fluids and Mucomyst with minimal dye load.   PAST MEDICAL HISTORY:  1. Hypertension.  2. Diet-controlled diabetes.  3. Hyperlipidemia.  4. Coronary artery disease status post bypass.  5. Appendectomy.   HOSPITAL COURSE:  [name] is a 77 year old Caucasian gentleman  followed by Dr. Antoine Poche and Dr. Wallace Cullens who presented at the  office with fatigue and abnormal stress test with known history of  coronary artery disease evaluated by Dr. Antoine Poche.  Dr. Antoine Poche felt  the patient needed further evaluation with cardiac catheterization.  The  patient with known chronic kidney disease would need to be admitted for  gentle hydration.  The patient is in agreement.  Also, the patient's  Micardis put on hold.  The patient admitted on May 13, 2008, for  hydration.  Renal consulted for above.  The patient premedicated with  Mucomyst and isosorbide increased to 120 mg daily.  The patient to the  Cath Lab on May 15, 2008, by Dr. Charlies Constable, tentative plans for  PCI of  circumflex on May 22, 2008.  Dr. Hillis Range in to see the  patient on day of discharge.  Cath site is stable with vital signs  stable.  Creatinine 2.2.  The patient being discharged home with request  for a BMET to be drawn at Dr. Karie Chimera office on Monday, results faxed  to Dr. Antoine Poche.  The patient has been given a prescription for this  blood work.   MEDICATIONS AT TIME OF DISCHARGE:  1. Plavix 75.  2. Aspirin 325.  3. Lopressor 25 b.i.d.  4. Crestor 5.  5. Flomax 0.4.  6. Isosorbide 120.  7. Nitroglycerin as needed.  He has a prescription for the Plavix, Lopressor, isosorbide, and  nitroglycerin.   He has been given the post-cardiac catheterization discharge  instructions.  He is instructed to hold his Micardis until further  evaluation by Dr. Antoine Poche.  He is to return on May 21, 2008, for  IV fluids and prep for PCI on May 22, 2008.  DURATION OF DISCHARGE ENCOUNTER:  Over 30 minutes.      Dorian Pod, ACNP      Hillis Range, MD  Electronically Signed    MB/MEDQ  D:  05/16/2008  T:  05/16/2008  Job:  [id number]   cc:   Wallace Cullens

## 2010-11-08 NOTE — Consult Note (Signed)
[name] [name], RIGGI NO.:  [id number]   MEDICAL RECORD NO.:  [id number]          PATIENT TYPE:  INP   LOCATION:  2039                         FACILITY:  [facility]   PHYSICIAN:  Maree Krabbe, M.D.DATE OF BIRTH:  Jun 28, 1927   DATE OF CONSULTATION:  05/15/2008  DATE OF DISCHARGE:                                 CONSULTATION   REQUESTING PHYSICIAN:  [name], MD, [facility].   REASON FOR CONSULT:  CKD evaluation and risk for chronic contrast  administration.   HISTORY:  This is a 77 year old white male with history of hypertension,  12 years duration, diet-controlled diabetes, and history of coronary  artery disease with a CABG in 1997.  Recently, the patient developed  extreme episodic fatigue and had an abnormal cardiac stress test.  He  was felt to be at significant risk for worsening of underlying coronary  artery disease and heart catheterization is being considered.  We are  asked to see the patient to given an estimate of renal risk due to  contrast administration.  The patient has no known history of kidney  disease and no old creatinine measurements in the system here.  He does  have a history of prostate symptoms, and is on Flomax.  His review of  systems is otherwise negative.  Particularly, he denies any fevers,  chills, sweats, weight loss, sore throat, difficulty swallowing,  shortness of breath, chest pain, abdominal pain, and difficulty voiding.  He has nocturia 3-4 times per night.  He denies any ankle edema, joint  pain, or swelling.  No history of stroke, TIA, or seizure.   PAST MEDICAL HISTORY:  1. Hypertension.  2. Diet-controlled diabetes for 4 years.  3. Hyperlipidemia.  4. Coronary artery disease.   PAST SURGICAL HISTORY:  1. CABG.  2. Appendectomy.   Home medications include Crestor, Flomax, Micardis, and isosorbide.   Inpatient meds are aspirin, Mucomyst, Crestor, Imdur, Flomax, and  Lopressor.   SOCIAL HISTORY:  He worked for  the [employer] in the past  and currently owns a print shop.  No tobacco or alcohol abuse.  His wife  is with him today.   REVIEW OF SYSTEMS:  As above.   FAMILY HISTORY:  Noncontributory.  No history of renal failure in the  family.   PHYSICAL EXAMINATION:  VITAL SIGNS:  Blood pressure 140/60, temp 97.7,  and 97% O2 sat on room air.  GENERAL:  The patient is alert and oriented x3, well developed, and well  nourished.  SKIN:  Warm and dry without rashes.  HEENT:  PERRLA, EOMI.  I did not detect any diabetic retinopathy on the  funduscopic exam.  Throat is clear and moist.  NECK:  Supple with flat neck veins, I do hear faint bilateral carotid  bruits.  CHEST:  Clear throughout with good air movement.  No wheezing or rales.  CARDIAC:  Quiet precordium with no heaves, murmur, or gallop.  ABDOMEN:  Soft and nontender.  No mass, no hepatomegaly, no ascites, and  no bruits.  EXTREMITIES:  No femoral bruits.  He has good pulses in both feet.  No  edema whatsoever.  Good muscular tone.  NEUROLOGIC:  Alert and oriented x3, quite excellent strength in all  extremities.   LABORATORY DATA:  Sodium 141, BUN 20, and creatinine 2.35.  Estimated  GFR 30 mL per minute.  Ultrasound done today 10.5 cm kidneys on both  sides, no hydronephrosis, and mild echogenicity.   IMPRESSION:  1. Chronic kidney disease stage III to IV, closer to stage IV;      possibly due to hypertensive nephrosclerosis, also consider      renovascular disease.  We will need to evaluate for multiple      myeloma.  Will order baseline urinalysis, as well as SPEP and UPEP.  2. History of coronary artery disease and prior coronary artery bypass      graft with positive stress test and possible anginal equivalent of      fatigue.  Risk of transient acute renal failure is around 25-30%.      Risk of permanent renal failure requiring dialysis in this setting      is maximum 5-10%.  I discussed with the patient, he is  considering      going ahead with the procedure given the estimated risks.  I have      also discussed the case with Dr. Antoine Poche.  If proceeding, I would      recommend minimizing the dye load, avoid ventriculogram, continue      IV fluids as you have been doing and continue Mucomyst.  His ARB      has been held since admission, so this should not be a factor.  3. Hypertension.  4. Diabetes, controlled diabetes mellitus.   RECOMMENDATIONS:  See above. We will follow.   Thank you for the referral.      Maree Krabbe, M.D.  Electronically Signed     RDS/MEDQ  D:  05/15/2008  T:  05/16/2008  Job:  [id number]

## 2010-11-08 NOTE — Assessment & Plan Note (Signed)
[facility]                            CARDIOLOGY OFFICE NOTE   NAME:[name], [name]                     MRN:          [id number]  DATE:07/28/2008                            DOB:          1928/02/18    PRIMARY CARE PHYSICIAN:  Dr. Wallace Cullens.   REASON FOR PRESENTATION:  Evaluate the patient with coronary disease and  chest pain.   HISTORY OF PRESENT ILLNESS:  The patient presents for followup.  He is  77 years old.  He has done relatively well since I last saw him.  He  will occasionally get some chest discomfort.  This happens when he is  emotionally stressed.  It happens at odd times too when he steps out of  the shower or bends over to put some things away at rotary.  He has  occasionally taken nitroglycerin, but usually just lets the pain go away  on its own.  It is similar to previous discomfort that he had.  It is  not any worsen than previous.  It has been going on since I saw him and  since his angioplasty.  He has had no new shortness of breath, PND, or  orthopnea.  He is not having as much fatigue as he was having; though he  is not yet exercising as much as he had.  He is not having any  palpitation, presyncope, or syncope.   He was waiting to ask me whether he should start cardiac rehab and I  have suggested this.   PAST MEDICAL HISTORY:  Coronary artery disease (the patient had a  previous CABG in 1997 with a LIMA to the LAD, SVG to OM and SVG to PDA.  Last catheterization was by Dr. Juanda Chance in November.  He had an LAD,  which was occluded.  The circumflex demonstrated occlusion of a second  posterolateral branch 90% followed by 80% stenosis in a large  posterolateral branch, 50% stenosis of the first marginal.  The right  coronary artery had a 90% stenosis extending from the ostium to the  posterior branch.  The saphenous vein graft to the right coronary artery  was occluded at the origin.  The LIMA to the LAD was patent.  The  vein  graft to the circumflex was occluded.  EF was 58%.  The patient had a  PROMUS drug eluting stent of the circumflex.), chronic renal  insufficiency (probably related to diabetes), hyperlipidemia,  hypertension, type 2 diabetes mellitus, and appendectomy.   ALLERGIES:  None.   MEDICATIONS:  1. Aspirin 325 mg daily.  2. Fish oil.  3. Cod liver oil.  4. Flomax 0.4 mg daily.  5. Plavix 75 mg daily.  6. Crestor 5 mg daily.  7. Imdur 120 mg daily.  8. Metoprolol 25 mg b.i.d.  9. ICaps.  10.Cinnamon.   REVIEW OF SYSTEMS:  As stated in the HPI and otherwise negative for  other systems.   PHYSICAL EXAMINATION:  GENERAL:  The patient is in no distress.  VITAL SIGNS:  Blood pressure 119/67, heart rate 66 and regular, weight  182 pounds, and body mass  index is 27.  HEENT:  Eyes unremarkable; pupils are equal, round, and reactive to  light; fundi not well visualized; oral mucosa unremarkable.  NECK:  No jugular venous distention at 45 degrees, carotid upstroke  brisk and symmetric, no bruits, no thyromegaly.  LYMPHATICS:  No cervical, axillary, or inguinal adenopathy.  LUNGS:  Clear to auscultation bilaterally.  BACK:  No costovertebral angle tenderness.  CHEST:  Unremarkable except for well-healed sternotomy scar.  HEART:  PMI not displaced or sustained; S1 and S2 within normal limits,  no S3, no S4; no clicks, no rubs, no murmurs.  ABDOMEN:  Flat; positive bowel sounds, normal in frequency and pitch; no  bruits, no rebound, no guarding, no midline pulsatile mass; no  organomegaly.  SKIN:  No rashes, no nodules.  EXTREMITIES:  Pulses 2+, no edema.   ASSESSMENT AND PLAN:  1. Coronary artery disease.  The patient does have some exertional      chest discomfort.  However, this seems to be a stable pattern.      Overall, he seems to be improved since his recent PCI.  At this      point, I do not think further cardiovascular testing is suggested.      I have encouraged him to  participate in cardiac rehab and I think      he will join this.  We will continue with aggressive secondary risk      reduction.  2. Renal insufficiency.  He is to see Dr. Arrie Aran soon as a new      patient.  3. Diabetes per Dr. Omer Jack.  4. Dyslipidemia per Dr. Omer Jack.  The goals are LDL less than 70 and      HDL greater than 40.  5. Hypertension.  Blood pressure is well controlled now.  We will keep      an eye on this.  I will defer to Dr. Arrie Aran, whether to restart      his Micardis for kidney protection, which will also treat his blood      pressure.  6. Followup.  Given his chest discomfort, I am going to see him again      in 4 months.  I will see him sooner if he has any increased      symptoms.     [name], MD, [facility]  Electronically Signed    JH/MedQ  DD: 07/28/2008  DT: 07/29/2008  Job #: [id number]   cc:   Wallace Cullens

## 2010-11-08 NOTE — Cardiovascular Report (Signed)
NAMEADAL, SERENO            ACCOUNT NO.:  [id number]   MEDICAL RECORD NO.:  [id number]          PATIENT TYPE:  INP   LOCATION:  6526                         FACILITY:  [facility]   PHYSICIAN:  Everardo Beals. Juanda Chance, MD, FACCDATE OF BIRTH:  [birth year]   DATE OF PROCEDURE:  05/22/2008  DATE OF DISCHARGE:                            CARDIAC CATHETERIZATION   CLINICAL HISTORY:  Mr. Yankee is 77 years old and has had previous  bypass surgery and recently has developed exertional angina.  We studied  him last week and found that he had graft to the circumflex and right  coronary artery occluded.  His distal right coronary artery had a high-  grade lesion in the posterior descending branch, which filled by  collaterals, which we elected to treat medically.  He had a high-grade  lesion in the mid circumflex artery, which we decided to bring him back  for intervention today.  Creatinine was 2.3-2.6 and we were very careful  to minimize contrast during his diagnostic procedure and we sent him  home to stage the procedure and brought him back today.  Creatinine was  2.3 this morning, which was stable.   PROCEDURE:  The procedure was performed via the right femoral artery  using an arterial sheath and a 6-French preformed coronary catheters.  Procedure was performed by the right femoral arteries and arterial  sheath and a 6-French CLS4 guiding catheter.  A front wall arterial  puncture was performed and Omnipaque contrast was used.  We made every  attempt to minimize contrast.  We passed the wire down the vessel  without difficulty.  We used a marker wire to size our stent.  We  predilated 2.25 x 20 mm apex performing 2 inflations up to 8 atmospheres  for 30 seconds.  We then deployed a 2.5 x 28 mm Promus stent deploying  this with 1 inflation of 9 atmospheres for 30 seconds.  We had a great  deal of difficulty getting a post-dilatation balloon down and finally  had to do this with the use of a  wiggle wire and a shorter balloon.  We  used a 2.75 x 12 mm [place] Sprinter.  We performed 3 dilatations up to 14  atmospheres for 30 seconds.   We then thought we were done, but the last picture showed worse disease  in the ostium.  There was moderate disease before and in passing the  catheters and stents this had gotten worse and we felt it needed to be  treated.  For this reason, we direct stented this with a 2.75 x 8 mm  Promus stent deploying this with 1 inflation of 40 atmospheres for 30  seconds.  We postdilated with a 3.0 x 8 mm [place] Voyager performing 1  inflation up to 14 atmospheres for 30 seconds.  Final diagnostics were  performed through the guiding catheter.   The patient tolerated the procedure well and left Laboratory in  satisfactory condition.  Right femoral artery was closed with Angio-Seal  at the end of the procedure.  Despite fact that we had to treat the  ostial lesion of  the right coronary, we were able to get by with only 55  mL of contrast.   CONCLUSION:  1. Successful PCI of the lesion in the mid circumflex artery using a      Promus drug-eluting stent with improvement in center narrowing from      90%-0%.  2. Successful PCI of the tight ostial lesion in the circumflex artery      using a Promus drug-eluting stent with improvement in center      narrowing from 80%-0%.   DISPOSITION:  The patient comes in for further observation.  We will  plan to check the creatinine tomorrow.  If it is stable, I think he can  go home, but he will need a creatinine check next week in Dr. Karie Chimera  office on Monday.      Bruce Elvera Lennox Juanda Chance, MD, [facility]  Electronically Signed     BRB/MEDQ  D:  05/22/2008  T:  05/23/2008  Job:  [id number]   cc:   [name], MD, [facility] Omer Jack

## 2011-02-13 ENCOUNTER — Encounter: Payer: Self-pay | Admitting: Cardiology

## 2011-03-29 LAB — BASIC METABOLIC PANEL
BUN: 28 — ABNORMAL HIGH
BUN: 29 — ABNORMAL HIGH
BUN: 34 — ABNORMAL HIGH
CO2: 22
CO2: 23
CO2: 23
Calcium: 8.6
Calcium: 8.8
Chloride: 109
Chloride: 114 — ABNORMAL HIGH
Creatinine, Ser: 2.16 — ABNORMAL HIGH
Creatinine, Ser: 2.27 — ABNORMAL HIGH
Creatinine, Ser: 2.3 — ABNORMAL HIGH
Creatinine, Ser: 2.35 — ABNORMAL HIGH
GFR calc Af Amer: 33 — ABNORMAL LOW
GFR calc Af Amer: 34 — ABNORMAL LOW
GFR calc Af Amer: 36 — ABNORMAL LOW
GFR calc non Af Amer: 27 — ABNORMAL LOW
GFR calc non Af Amer: 28 — ABNORMAL LOW
Glucose, Bld: 82
Glucose, Bld: 88
Glucose, Bld: 91
Potassium: 4.3
Potassium: 4.5
Potassium: 4.8
Sodium: 138
Sodium: 141

## 2011-03-29 LAB — COMPREHENSIVE METABOLIC PANEL
ALT: 37
Alkaline Phosphatase: 253 — ABNORMAL HIGH
BUN: 11
Chloride: 100
GFR calc Af Amer: >60
Potassium: 3.6
Sodium: 133 — ABNORMAL LOW
Total Bilirubin: 0.6

## 2011-03-29 LAB — CBC
HCT: 29.5 — ABNORMAL LOW
HCT: 32.1 — ABNORMAL LOW
HCT: 34.5 — ABNORMAL LOW
Hemoglobin: 10.1 — ABNORMAL LOW
Hemoglobin: 11.8 — ABNORMAL LOW
MCHC: 33.9
MCHC: 34.1
MCV: 91.4
MCV: 91.6
Platelets: 190
RBC: 3.78 — ABNORMAL LOW
RDW: 12.7
RDW: 12.8
WBC: 6.3
WBC: 6.7
WBC: 6.9

## 2011-03-29 LAB — GLUCOSE, CAPILLARY
Glucose-Capillary: 121 — ABNORMAL HIGH
Glucose-Capillary: 131 — ABNORMAL HIGH
Glucose-Capillary: 136 — ABNORMAL HIGH
Glucose-Capillary: 175 — ABNORMAL HIGH
Glucose-Capillary: 92

## 2011-03-29 LAB — RENAL FUNCTION PANEL
BUN: 28 — ABNORMAL HIGH
CO2: 22
Chloride: 112
Creatinine, Ser: 2.2 — ABNORMAL HIGH
GFR calc non Af Amer: 29 — ABNORMAL LOW
Glucose, Bld: 88

## 2011-03-29 LAB — IMMUNOFIXATION ADD-ON

## 2011-03-29 LAB — PROTEIN ELECTROPH W RFLX QUANT IMMUNOGLOBULINS
Albumin ELP: 57.2
Total Protein ELP: 6

## 2011-03-29 LAB — PROTIME-INR: INR: 1.1

## 2011-03-29 LAB — PTH, INTACT AND CALCIUM: PTH: 87.7 — ABNORMAL HIGH

## 2011-03-29 LAB — IGG, IGA, IGM: IgM, Serum: 106

## 2011-06-08 ENCOUNTER — Encounter: Payer: Self-pay | Admitting: *Deleted

## 2011-06-08 ENCOUNTER — Encounter: Payer: Self-pay | Admitting: Cardiology

## 2011-06-09 ENCOUNTER — Encounter: Payer: Self-pay | Admitting: Cardiology

## 2011-06-09 ENCOUNTER — Ambulatory Visit (INDEPENDENT_AMBULATORY_CARE_PROVIDER_SITE_OTHER): Payer: Medicare Other | Admitting: Cardiology

## 2011-06-09 DIAGNOSIS — I10 Essential (primary) hypertension: Secondary | ICD-10-CM

## 2011-06-09 DIAGNOSIS — E785 Hyperlipidemia, unspecified: Secondary | ICD-10-CM

## 2011-06-09 DIAGNOSIS — I2510 Atherosclerotic heart disease of native coronary artery without angina pectoris: Secondary | ICD-10-CM

## 2011-06-09 NOTE — Assessment & Plan Note (Signed)
I will check with his primary MD to see if there is a fasting lipid recently.  If not he will need to have this drawn at the time of the treadmill.

## 2011-06-09 NOTE — Progress Notes (Signed)
   HPI The patient presents for followup of his known coronary disease. Since I last saw him he has had no new symptoms consistent with previous angina. He will occasionally get some discomfort when he is lifting objects. However, light objects might cause the discomfort and carrying heavy objects upstairs will not bring this on. He denies any jaw or arm discomfort. He's had no new shortness of breath, PND or orthopnea. He's had no palpitations, presyncope or syncope. He has had no weight gain or edema.  No Known Allergies  Current Outpatient Prescriptions  Medication Sig Dispense Refill  . aspirin 325 MG tablet Take 325 mg by mouth daily.        Marland Kitchen CINNAMON PO Take 1 capsule by mouth daily.        . clopidogrel (PLAVIX) 75 MG tablet Take 75 mg by mouth daily.       . COD LIVER OIL PO Take by mouth.        . CRESTOR 10 MG tablet Take 10 mg by mouth daily.       . isosorbide mononitrate (IMDUR) 120 MG 24 hr tablet Take 120 mg by mouth daily.       . metoprolol (LOPRESSOR) 100 MG tablet Take 100 mg by mouth 2 (two) times daily. Tale a 1/2 tablet in AM & PM.      . Multiple Vitamins-Minerals (ICAPS MV PO) Take by mouth.        Marland Kitchen NITROSTAT 0.4 MG SL tablet       . Omega-3 Fatty Acids (FISH OIL) 1200 MG CAPS Take 1 capsule by mouth daily.        . vitamin C (ASCORBIC ACID) 500 MG tablet Take 500 mg by mouth daily.          Past Medical History  Diagnosis Date  . HYPERTENSION   . HYPERLIPIDEMIA   . CAD   . RENAL INSUFFICIENCY   . DM     Past Surgical History  Procedure Date  . Appendectomy   . Coronary artery bypass graft     ROS:  As stated in the HPI and negative for all other systems.  PHYSICAL EXAM BP 144/78  Pulse 71  Resp 18  Ht 5\' 8"  (1.727 m)  Wt 181 lb 6.4 oz (82.283 kg)  BMI 27.58 kg/m2 GENERAL:  Well appearing HEENT:  Pupils equal round and reactive, fundi not visualized, oral mucosa unremarkable NECK:  No jugular venous distention, waveform within normal limits,  carotid upstroke brisk and symmetric, no bruits, no thyromegaly LYMPHATICS:  No cervical, inguinal adenopathy LUNGS:  Clear to auscultation bilaterally BACK:  No CVA tenderness CHEST:  Well healed sternotomy scar. HEART:  PMI not displaced or sustained,S1 and S2 within normal limits, no S3, no S4, no clicks, no rubs, no murmurs ABD:  Flat, positive bowel sounds normal in frequency in pitch, no bruits, no rebound, no guarding, no midline pulsatile mass, no hepatomegaly, no splenomegaly EXT:  2 plus pulses throughout, no edema, no cyanosis no clubbing, missing part of the right thumb SKIN:  No rashes no nodules NEURO:  Cranial nerves II through XII grossly intact, motor grossly intact throughout PSYCH:  Cognitively intact, oriented to person place and time  EKG:  Sinus rhythm, rate 71, right bundle branch block, left anterior fascicular block, QTC slightly prolonged  ASSESSMENT AND PLAN

## 2011-06-09 NOTE — Patient Instructions (Signed)
The current medical regimen is effective;  continue present plan and medications.  Your physician has requested that you have an exercise tolerance test. For further information please visit [email or web address]. Please also follow instruction sheet, as given.   

## 2011-06-09 NOTE — Assessment & Plan Note (Signed)
The blood pressure is at target. No change in medications is indicated. We will continue with therapeutic lifestyle changes (TLC).  

## 2011-06-09 NOTE — Assessment & Plan Note (Signed)
The patient does have some atypical chest pain and known coronary disease. I will bring the patient back for a POET (Plain Old Exercise Test). This will allow me to screen for obstructive coronary disease, risk stratify and very importantly provide a prescription for exercise.

## 2011-07-01 ENCOUNTER — Other Ambulatory Visit: Payer: Self-pay | Admitting: Cardiology

## 2011-07-20 ENCOUNTER — Encounter: Payer: BLUE CROSS/BLUE SHIELD | Admitting: Cardiology

## 2011-07-20 ENCOUNTER — Ambulatory Visit (INDEPENDENT_AMBULATORY_CARE_PROVIDER_SITE_OTHER): Payer: Medicare Other | Admitting: Cardiology

## 2011-07-20 ENCOUNTER — Other Ambulatory Visit: Payer: Self-pay | Admitting: Cardiology

## 2011-07-20 DIAGNOSIS — I2510 Atherosclerotic heart disease of native coronary artery without angina pectoris: Secondary | ICD-10-CM

## 2011-07-20 NOTE — Progress Notes (Signed)
Exercise Treadmill Test  Pre-Exercise Testing Evaluation Rhythm: normal sinus  Rate: 75   PR:  .15 QRS:  .13  QT:  .43 QTc: 48     Test  Exercise Tolerance Test Ordering MD: Angelina Sheriff, MD  Interpreting MD:  Angelina Sheriff, MD  Unique Test No: 1  Treadmill:  1  Indication for ETT: known ASHD  Contraindication to ETT: No   Stress Modality: exercise - treadmill  Cardiac Imaging Performed: non   Protocol: standard Bruce - maximal  Max BP:  180/85  Max MPHR (bpm):  139 85% MPR (bpm):  118   MPHR obtained (bpm):  150 % MPHR obtained:  107  Reached 85% MPHR (min:sec):  1:50 Total Exercise Time (min-sec):  4:00  Workload in METS:  5.8 Borg Scale: 13  Reason ETT Terminated:  desired heart rate attained    ST Segment Analysis At Rest: normal ST segments - no evidence of significant ST depression With Exercise: no evidence of significant ST depression  Other Information Arrhythmia:  No Angina during ETT:  present (1) Quality of ETT:  non-diagnostic  ETT Interpretation:  normal - no evidence of ischemia by ST analysis  Comments: The patient had a low to moderate exercise tolerance. He did achieve his target heart rate without any ischemic ST changes. However, he had some chest discomfort. He had a slightly hypertensive blood pressure response. There were no arrhythmias. He did not have a normal heart rate recovery.  Recommendations: This is a nondiagnostic test. There were no EKG changes though he was only of ago for 4 minutes because of blood pressure issues. He did have some chest discomfort. This was a mild substernal aching. I have suggested further perfusion imaging. However, he was to think about this. He's not sure he would ever want to have a cardiac catheterization in which case stress perfusion imaging might not be indicated.

## 2011-11-09 ENCOUNTER — Telehealth: Payer: Self-pay | Admitting: Cardiology

## 2011-11-09 NOTE — Telephone Encounter (Signed)
They couldn't remember the name of the MD Dr Antoine Poche wanted him to see for his hernia.  According to chart Dr Avel Peace.  Pt is aware.

## 2011-11-09 NOTE — Telephone Encounter (Signed)
Please return call to patient wife [phone]  Patient having trouble with hernia, please return call to advise. [phone]

## 2011-11-28 ENCOUNTER — Encounter (INDEPENDENT_AMBULATORY_CARE_PROVIDER_SITE_OTHER): Payer: Self-pay | Admitting: Surgery

## 2011-11-28 ENCOUNTER — Ambulatory Visit (INDEPENDENT_AMBULATORY_CARE_PROVIDER_SITE_OTHER): Payer: Medicare Other | Admitting: Surgery

## 2011-11-28 VITALS — BP 130/72 | HR 62 | Temp 98.3°F | Resp 16 | Ht 68.0 in | Wt 175.2 lb

## 2011-11-28 DIAGNOSIS — K4020 Bilateral inguinal hernia, without obstruction or gangrene, not specified as recurrent: Secondary | ICD-10-CM

## 2011-11-28 HISTORY — DX: Bilateral inguinal hernia, without obstruction or gangrene, not specified as recurrent: K40.20

## 2011-11-28 NOTE — Patient Instructions (Signed)
Hernia  A hernia occurs when an internal organ pushes out through a weak spot in the abdominal wall. Hernias most commonly occur in the groin and around the navel. Hernias often can be pushed back into place (reduced). Most hernias tend to get worse over time. Some abdominal hernias can get stuck in the opening (irreducible or incarcerated hernia) and cannot be reduced. An irreducible abdominal hernia which is tightly squeezed into the opening is at risk for impaired blood supply (strangulated hernia). A strangulated hernia is a medical emergency. Because of the risk for an irreducible or strangulated hernia, surgery may be recommended to repair a hernia.  CAUSES    Heavy lifting.   Prolonged coughing.   Straining to have a bowel movement.   A cut (incision) made during an abdominal surgery.  HOME CARE INSTRUCTIONS    Bed rest is not required. You may continue your normal activities.   Avoid lifting more than 10 pounds (4.5 kg) or straining.   Cough gently. If you are a smoker it is best to stop. Even the best hernia repair can break down with the continual strain of coughing. Even if you do not have your hernia repaired, a cough will continue to aggravate the problem.   Do not wear anything tight over your hernia. Do not try to keep it in with an outside bandage or truss. These can damage abdominal contents if they are trapped within the hernia sac.   Eat a normal diet.   Avoid constipation. Straining over long periods of time will increase hernia size and encourage breakdown of repairs. If you cannot do this with diet alone, stool softeners may be used.  SEEK IMMEDIATE MEDICAL CARE IF:    You have a fever.   You develop increasing abdominal pain.   You feel nauseous or vomit.   Your hernia is stuck outside the abdomen, looks discolored, feels hard, or is tender.   You have any changes in your bowel habits or in the hernia that are unusual for you.   You have increased pain or swelling around the  hernia.   You cannot push the hernia back in place by applying gentle pressure while lying down.  MAKE SURE YOU:    Understand these instructions.   Will watch your condition.   Will get help right away if you are not doing well or get worse.  Document Released: 06/12/2005 Document Revised: 06/01/2011 Document Reviewed: 01/30/2008  [employer] Patient Information 2012 [employer]

## 2011-11-28 NOTE — Progress Notes (Signed)
Subjective:     Patient ID: [name] [name], male   DOB: 04-26-1928, 78 y.o.   MRN: [id number]  HPI  [name] [name]  12/02/1927 [id number]  Patient Care Team: Lindwood Qua as PCP - General (Internal Medicine) [name], MD as Consulting Physician (Cardiology)  This patient is a 78 y.o.male who presents today for surgical evaluation at the request of Dr. Antoine Poche.   Reason for visit: Chronic left groin hernia. Consideration of surgery.  Patient is an active male. Active with moderate lifting all his life for his work. He comes today with his wife. He noticed a swelling in his left groin a few years ago. Usually does not bother him when its out. However, sometimes it will spontaneous reduce and that causes pain and discomfort. Eating regularly. No nausea or vomiting. No episodes of severe groin pain. Has chronic coronary artery disease status post a bypass and stents. Followed by his cardiologist. Right an exercise bike every day. His wife is concerned. Surgery was recommended  Patient Active Problem List  Diagnoses  . DM  . HYPERLIPIDEMIA  . HYPERTENSION  . CAD  . RENAL INSUFFICIENCY    Past Medical History  Diagnosis Date  . HYPERTENSION   . HYPERLIPIDEMIA   . CAD   . RENAL INSUFFICIENCY   . DM     Past Surgical History  Procedure Date  . Appendectomy   . Coronary artery bypass graft   . Cardiac stents placement 2010    History   Social History  . Marital Status: Married    Spouse Name: N/A    Number of Children: N/A  . Years of Education: N/A   Occupational History  . Not on file.   Social History Main Topics  . Smoking status: Never Smoker   . Smokeless tobacco: Never Used  . Alcohol Use: No  . Drug Use: No  . Sexually Active: Not on file   Other Topics Concern  . Not on file   Social History Narrative  . No narrative on file    History reviewed. No pertinent family history.  Current Outpatient Prescriptions  Medication Sig Dispense  Refill  . aspirin 325 MG tablet Take 325 mg by mouth daily.        Marland Kitchen CINNAMON PO Take 1 capsule by mouth daily.        . clopidogrel (PLAVIX) 75 MG tablet Take 75 mg by mouth daily.       . COD LIVER OIL PO Take by mouth.        . CRESTOR 10 MG tablet Take 10 mg by mouth daily.       . Iron Combinations (IRON COMPLEX PO) Take by mouth.      . isosorbide mononitrate (IMDUR) 120 MG 24 hr tablet Take 120 mg by mouth daily.       . metoprolol (LOPRESSOR) 100 MG tablet TAKE ONE TABLET BY MOUTH IN THE MORNING ONE-HALF IN THE EVENING  30 tablet  11  . Multiple Vitamins-Minerals (EYE VITAMINS) CAPS Take by mouth.      Marland Kitchen NITROSTAT 0.4 MG SL tablet DISSOLVE ONE TABLET UNDER THE TONGUE EVERY 5 MINUTES AS NEEDED FOR CHEST PAIN.  DO NOT EXCEED A TOTAL OF 3 DOSES IN 15 MINUTES  25 each  12  . Omega-3 Fatty Acids (FISH OIL) 1200 MG CAPS Take 1 capsule by mouth daily.        . vitamin C (ASCORBIC ACID) 500 MG tablet Take 500 mg by mouth daily.  No Known Allergies  BP 130/72  Pulse 62  Temp(Src) 98.3 F (36.8 C) (Temporal)  Resp 16  Ht 5\' 8"  (1.727 m)  Wt 175 lb 3.2 oz (79.47 kg)  BMI 26.64 kg/m2  No results found.   Review of Systems  Constitutional: Negative for fever, chills and diaphoresis.  HENT: Negative for nosebleeds, sore throat, facial swelling, mouth sores, trouble swallowing and ear discharge.   Eyes: Negative for photophobia, discharge and visual disturbance.  Respiratory: Negative for choking, chest tightness, shortness of breath and stridor.   Cardiovascular: Negative for chest pain and palpitations.  Gastrointestinal: Negative for nausea, vomiting, abdominal pain, diarrhea, constipation, blood in stool, abdominal distention, anal bleeding and rectal pain.  Genitourinary: Negative for dysuria, urgency, difficulty urinating and testicular pain.  Musculoskeletal: Negative for myalgias, back pain, arthralgias and gait problem.  Skin: Negative for color change, pallor, rash  and wound.  Neurological: Negative for dizziness, speech difficulty, weakness, numbness and headaches.  Hematological: Negative for adenopathy. Does not bruise/bleed easily.  Psychiatric/Behavioral: Negative for hallucinations, confusion and agitation.       Objective:   Physical Exam  Constitutional: He is oriented to person, place, and time. He appears well-developed and well-nourished. No distress.  HENT:  Head: Normocephalic.  Mouth/Throat: Oropharynx is clear and moist. No oropharyngeal exudate.  Eyes: Conjunctivae and EOM are normal. Pupils are equal, round, and reactive to light. No scleral icterus.  Neck: Normal range of motion. Neck supple. No tracheal deviation present.  Cardiovascular: Normal rate, regular rhythm and intact distal pulses.   Pulmonary/Chest: Effort normal and breath sounds normal. No respiratory distress.  Abdominal: Soft. He exhibits no distension. There is no tenderness. There is no CVA tenderness. A hernia is present. Hernia confirmed positive in the right inguinal area and confirmed positive in the left inguinal area. Hernia confirmed negative in the ventral area.    Musculoskeletal: Normal range of motion. He exhibits no tenderness.       Arms: Lymphadenopathy:    He has no cervical adenopathy.       Right: No inguinal adenopathy present.       Left: No inguinal adenopathy present.  Neurological: He is alert and oriented to person, place, and time. No cranial nerve deficit. He exhibits normal muscle tone. Coordination normal.  Skin: Skin is warm and dry. No rash noted. He is not diaphoretic. No erythema. No pallor.  Psychiatric: He has a normal mood and affect. His behavior is normal. Judgment and thought content normal.       Assessment:     BIH, L>>>R    Plan:     The anatomy & physiology of the abdominal wall and pelvic floor was discussed.  The pathophysiology of hernias in the inguinal and pelvic region was discussed.  Natural history risks  such as progressive enlargement, pain, incarceration & strangulation was discussed.   Contributors to complications such as smoking, obesity, diabetes, prior surgery, etc were discussed.    I feel the risks of no intervention will lead to serious problems that outweigh the operative risks; therefore, I recommended surgery to reduce and repair the hernia.  I explained laparoscopic techniques with possible need for an open approach.  I noted usual use of mesh to patch and/or buttress hernia repair  Risks such as bleeding, infection, abscess, need for further treatment, heart attack, death, and other risks were discussed.  I noted a good likelihood this will help address the problem.   Goals of post-operative recovery were discussed  as well.  Possibility that this will not correct all symptoms was explained.  I stressed the importance of low-impact activity, aggressive pain control, avoiding constipation, & not pushing through pain to minimize risk of post-operative chronic pain or injury. Possibility of reherniation was discussed.  We will work to minimize complications.     An educational handout further explaining the pathology & treatment options was given as well.  Questions were answered.  The patient expresses understanding & wishes to proceed with surgery.  I am concerned about the health of the patient and the ability to tolerate the operation.  Therefore, we will request clearance by cardiology to better assess operative risk & see if a reevaluation, further workup, adjustment to medications, etc is needed.

## 2012-01-01 ENCOUNTER — Telehealth (INDEPENDENT_AMBULATORY_CARE_PROVIDER_SITE_OTHER): Payer: Self-pay

## 2012-01-01 NOTE — Telephone Encounter (Signed)
Called pt to see about getting scheduled for lap. Hernia repair but pt needs cardiac clearance. The pt wishes not to get scheduled now for surgery he will call us back when he decides. The pt did not get scheduled for his stress test like advised he will r/s that also when he decides about surgery.

## 2012-07-22 ENCOUNTER — Other Ambulatory Visit: Payer: Self-pay | Admitting: Cardiology

## 2012-07-22 NOTE — Telephone Encounter (Signed)
..   Requested Prescriptions   Pending Prescriptions Disp Refills  . metoprolol (LOPRESSOR) 100 MG tablet [Pharmacy Med Name: METOPROLOL TART 100MG  TAB] 60 tablet 2    Sig: TAKE ONE TABLET BY MOUTH EVERY DAY IN THE MORNING AND ONE-HALF EVERY DAY IN THE EVENING  .Marland KitchenPatient needs to contact office to schedule  Appointment  for future refills.Ph:[phone]. Thank you.

## 2012-08-13 ENCOUNTER — Other Ambulatory Visit: Payer: Self-pay | Admitting: *Deleted

## 2012-08-13 MED ORDER — NITROSTAT 0.4 MG SL SUBL: SUBLINGUAL_TABLET | SUBLINGUAL | Status: DC

## 2012-12-26 ENCOUNTER — Other Ambulatory Visit: Payer: Self-pay

## 2012-12-26 MED ORDER — METOPROLOL TARTRATE 100 MG PO TABS: ORAL_TABLET | ORAL | Status: DC

## 2013-01-14 ENCOUNTER — Telehealth: Payer: Self-pay | Admitting: Cardiology

## 2013-01-14 NOTE — Telephone Encounter (Signed)
New problem    pts wife thinks pt needs to be seen before September-offerd pa(l.gerhardt) for august but pts wife declined-pt is having some swelling in his feet

## 2013-01-15 NOTE — Telephone Encounter (Signed)
appt scheduled for pt with Norma Fredrickson but I am unable to contact him from the [place] office - will need to call tomorrow with appt date and time

## 2013-01-16 NOTE — Telephone Encounter (Signed)
Wife aware of appointment  

## 2013-02-05 ENCOUNTER — Encounter: Payer: Self-pay | Admitting: Nurse Practitioner

## 2013-02-05 ENCOUNTER — Ambulatory Visit (INDEPENDENT_AMBULATORY_CARE_PROVIDER_SITE_OTHER): Payer: Medicare Other | Admitting: Nurse Practitioner

## 2013-02-05 VITALS — BP 150/66 | HR 63 | Ht 67.0 in | Wt 180.0 lb

## 2013-02-05 DIAGNOSIS — E785 Hyperlipidemia, unspecified: Secondary | ICD-10-CM

## 2013-02-05 DIAGNOSIS — I2510 Atherosclerotic heart disease of native coronary artery without angina pectoris: Secondary | ICD-10-CM

## 2013-02-05 NOTE — Progress Notes (Signed)
[name] [name] Date of Birth: 22-Feb-1928 Medical Record #[id number]  History of Present Illness: [name] [name] is seen back today for a work in visit. Seen for Dr. Antoine Poche. Has known CAD with past CABG in 1997 and last cath in 2009, HTN and HLD. Remains on chronic Plavix. Has CKD - no longer seeing renal.   Has not been seen here since 2012. Had GXT then - Myoview recommended.   Comes in today. Here with [name] [name] wife. [name] [name] seems to be feeling the same way [name] [name] was since [name] [name] was last here almost 2 years ago. History is very hard to follow. Seems very resistant to care. Wife sits in the chair with her head bowed and shaking her head - almost crying. [name] [name] notes that [name] [name] will still have pain with lifting - but not consistent. Riding [name] [name] bike and does ok for the most part. Uses NTG and gets quick relief but will get relief if [name] [name] just waits a little longer as well. Keeps reiterating that "not consistent" symptoms. Has had no follow up labs. No real regular follow up care from what I can see. Has stopped going to the renal docs. No syncope. Not short of breath.    Current Outpatient Prescriptions  Medication Sig Dispense Refill  . aspirin 325 MG tablet Take 325 mg by mouth daily.        . cholecalciferol (VITAMIN D) 1000 UNITS tablet Take 1,000 Units by mouth daily.      Marland Kitchen CINNAMON PO Take 1 capsule by mouth daily.        . clopidogrel (PLAVIX) 75 MG tablet Take 75 mg by mouth daily.       . COD LIVER OIL PO Take by mouth.        . CRESTOR 10 MG tablet Take 10 mg by mouth daily. Not sure of dosage      . Iron Combinations (IRON COMPLEX PO) Take by mouth.      . isosorbide mononitrate (IMDUR) 120 MG 24 hr tablet Take 120 mg by mouth daily.       . metoprolol (LOPRESSOR) 100 MG tablet Take 50 mg by mouth 2 (two) times daily.      . Multiple Vitamins-Minerals (EYE VITAMINS) CAPS Take by mouth.      Marland Kitchen NITROSTAT 0.4 MG SL tablet DISSOLVE ONE TABLET UNDER THE TONGUE EVERY 5 MINUTES AS NEEDED FOR CHEST PAIN.  DO  NOT EXCEED A TOTAL OF 3 DOSES IN 15 MINUTES  25 tablet  12  . Omega-3 Fatty Acids (FISH OIL) 1200 MG CAPS Take 1 capsule by mouth daily.        . vitamin C (ASCORBIC ACID) 500 MG tablet Take 500 mg by mouth daily.         No current facility-administered medications for this visit.    No Known Allergies  Past Medical History  Diagnosis Date  . HYPERTENSION   . HYPERLIPIDEMIA   . CAD   . RENAL INSUFFICIENCY   . DM   . Bilateral inguinal hernia (BIH), left scrotal 11/28/2011    Past Surgical History  Procedure Laterality Date  . Appendectomy    . Coronary artery bypass graft    . Cardiac stents placement  2010    History  Smoking status  . Never Smoker   Smokeless tobacco  . Never Used    History  Alcohol Use No    No family history on file.  Review of Systems: The review of systems is per  the HPI.  All other systems were reviewed and are negative.  Physical Exam: BP 150/66  Pulse 63  Ht 5\' 7"  (1.702 m)  Wt 180 lb (81.647 kg)  BMI 28.19 kg/m2 [name] [name] is alert and in no acute distress. Little resistant in attitude. Skin is warm and dry. Color is normal.  HEENT is unremarkable. Normocephalic/atraumatic. PERRL. Sclera are nonicteric. Neck is supple. No masses. No JVD. Lungs are clear. Cardiac exam shows a regular rate and rhythm. Abdomen is soft. Extremities are without edema. Gait and ROM are intact. No gross neurologic deficits noted.  LABORATORY DATA: EKG with sinus with bifascicular block - unchanged.   Lab Results  Component Value Date   WBC 6.9 05/23/2008   HGB 11.8* 05/23/2008   HCT 34.5* 05/23/2008   PLT 185 05/23/2008   GLUCOSE 117* 06/11/2008   ALT 37 05/21/2008   AST 34 05/21/2008   NA 141 06/11/2008   K 4.6 06/11/2008   CL 110 06/11/2008   CREATININE 2.4* 06/11/2008   BUN 43* 06/11/2008   CO2 25 06/11/2008   INR 1.1 05/21/2008   CARDIAC CATH CONCLUSION:  1. Coronary artery, [name] [name] is status post coronary bypass graft surgery in  1997.  2.  Severe native vessel disease with total occlusion left anterior  descending artery, 50% stenosis in the first marginal branch of the  circumflex artery and 90% and 80% tandem stenoses in the large  posterolateral branch and complete occlusion of the very distal  circumflex artery.  3. A 40% narrowing in the distal right coronary artery with long area  of 90% stenosis in the posterior descending branch of the right  coronary artery.  4. Occluded vein graft to the right coronary, occluded vein graft to  circumflex artery, and patent LIMA graft to the LAD.  5. Ejection fraction of 58% by recent Myoview scan.   RECOMMENDATIONS: The [name] [name] has a creatinine of 2.3. We used 42 mL of  contrast. The decision about intervention was difficult. I think, the  posterior descending lesion is not very favorable for intervention and  also it might require a large amount of contrast to fix. The tandem  lesions in the large posterolateral branch of circumflex artery, I  think, could be flexible with a long drug-eluting stent. We might  consider this and so I think this can be done without very much  contrast. I will discuss these findings with Dr. Antoine Poche and then we  will make a decision about how to proceed.   PCI CONCLUSION FROM 2009:  1. Successful PCI of the lesion in the mid circumflex artery using a  Promus drug-eluting stent with improvement in center narrowing from  90%-0%.  2. Successful PCI of the tight ostial lesion in the circumflex artery  using a Promus drug-eluting stent with improvement in center  narrowing from 80%-0%.   GXT Recommendations from 2012:  This is a nondiagnostic test. There were no EKG changes though [name] [name] was only of ago for 4 minutes because of blood pressure issues. [name] [name] did have some chest discomfort. This was a mild substernal aching. I have suggested further perfusion imaging. However, [name] [name] was to think about this. [name] [name]'s not sure [name] [name] would ever want to have a cardiac  catheterization in which case stress perfusion imaging might not be indicated.  Assessment / Plan: Chest pain with known CAD - remote CABG - last cath in 2009 with PCI and abnormal GXT from 2012 - VERY resistant to have anything done. Not really  sure why [name] [name] even made the appointment. I have tried to advise him that Myoview testing is at least warranted. May need medicines adjusted (but does not wish to do at this time) and update [name] [name] labs.   Not sure if [name] [name] will follow thru.   Sees. Dr. Antoine Poche in September.   [name] [name] is agreeable to this plan and will call if any problems develop in the interim.   Rosalio Macadamia, RN, ANP-C [facility] [address] [place], [place]  [address]

## 2013-02-05 NOTE — Patient Instructions (Addendum)
We will get a stress test arranged [name] [name])  Keep your visit with Dr. Antoine Poche for September  We will check fasting labs that day  For now, stay on your current medicines  Call the [facility] Care office at [phone] if you have any questions, problems or concerns.

## 2013-02-19 ENCOUNTER — Other Ambulatory Visit (INDEPENDENT_AMBULATORY_CARE_PROVIDER_SITE_OTHER): Payer: Medicare Other

## 2013-02-19 ENCOUNTER — Ambulatory Visit (HOSPITAL_COMMUNITY): Payer: Medicare Other | Attending: Cardiology | Admitting: Radiology

## 2013-02-19 VITALS — BP 150/78 | HR 57 | Ht 67.0 in | Wt 178.0 lb

## 2013-02-19 DIAGNOSIS — I2510 Atherosclerotic heart disease of native coronary artery without angina pectoris: Secondary | ICD-10-CM

## 2013-02-19 DIAGNOSIS — I4510 Unspecified right bundle-branch block: Secondary | ICD-10-CM

## 2013-02-19 DIAGNOSIS — R5381 Other malaise: Secondary | ICD-10-CM | POA: Insufficient documentation

## 2013-02-19 DIAGNOSIS — I10 Essential (primary) hypertension: Secondary | ICD-10-CM | POA: Insufficient documentation

## 2013-02-19 DIAGNOSIS — E785 Hyperlipidemia, unspecified: Secondary | ICD-10-CM

## 2013-02-19 DIAGNOSIS — E119 Type 2 diabetes mellitus without complications: Secondary | ICD-10-CM | POA: Insufficient documentation

## 2013-02-19 DIAGNOSIS — R079 Chest pain, unspecified: Secondary | ICD-10-CM

## 2013-02-19 LAB — CBC WITH DIFFERENTIAL/PLATELET
Basophils Absolute: 0 10*3/uL (ref 0.0–0.1)
Basophils Relative: 0.5 % (ref 0.0–3.0)
Eosinophils Absolute: 0 10*3/uL (ref 0.0–0.7)
Eosinophils Relative: 0 % (ref 0.0–5.0)
HCT: 33.9 % — ABNORMAL LOW (ref 39.0–52.0)
Hemoglobin: 11.4 g/dL — ABNORMAL LOW (ref 13.0–17.0)
Lymphocytes Relative: 21.5 % (ref 12.0–46.0)
Lymphs Abs: 1.7 10*3/uL (ref 0.7–4.0)
MCHC: 33.7 g/dL (ref 30.0–36.0)
MCV: 91.2 fl (ref 78.0–100.0)
Monocytes Absolute: 0.7 10*3/uL (ref 0.1–1.0)
Monocytes Relative: 8.3 % (ref 3.0–12.0)
Neutro Abs: 5.7 10*3/uL (ref 1.4–7.7)
Neutrophils Relative %: 69.7 % (ref 43.0–77.0)
Platelets: 183 10*3/uL (ref 150.0–400.0)
RBC: 3.72 Mil/uL — ABNORMAL LOW (ref 4.22–5.81)
RDW: 12.5 % (ref 11.5–14.6)
WBC: 8.1 10*3/uL (ref 4.5–10.5)

## 2013-02-19 MED ORDER — TECHNETIUM TC 99M SESTAMIBI GENERIC - CARDIOLITE
30.0000 | Freq: Once | INTRAVENOUS | Status: AC | PRN
  Administered 2013-02-19: 30 via INTRAVENOUS

## 2013-02-19 MED ORDER — REGADENOSON 0.4 MG/5ML IV SOLN
0.4000 mg | Freq: Once | INTRAVENOUS | Status: AC
  Administered 2013-02-19: 0.4 mg via INTRAVENOUS

## 2013-02-19 MED ORDER — TECHNETIUM TC 99M SESTAMIBI GENERIC - CARDIOLITE
10.0000 | Freq: Once | INTRAVENOUS | Status: AC | PRN
  Administered 2013-02-19: 10 via INTRAVENOUS

## 2013-02-19 NOTE — Progress Notes (Signed)
[facility] SITE 3 NUCLEAR MED [address] [place], [place] [address] [phone]    Cardiology Nuclear Med Study  [name] [name] is a 79 y.o. male     MRN : [id number]     DOB: 09-30-1927  Procedure Date: 02/19/2013  Nuclear Med Background Indication for Stress Test:  Evaluation for Ischemia, Graft Patency and Stent Patency History:  '09 GEX:BMWUXLKG ischemia, EF=58%>Cath>Stents Cardiac Risk Factors: Hypertension, Lipids and NIDDM  Symptoms:  Chest Tightness with and without Exertion (last episode of chest discomfort was about 2-days ago, relieved with NTG and rest) and Fatigue   Nuclear Pre-Procedure Caffeine/Decaff Intake:  None NPO After: 7:00pm   Lungs:  Clear. O2 Sat: 98% on room air. IV 0.9% NS with Angio Cath:  22g  IV Site: R Hand  IV Started by:  Bonnita Levan, RN  Chest Size (in):  44 Cup Size: n/a  Height: 5\' 7"  (1.702 m)  Weight:  178 lb (80.74 kg)  BMI:  Body mass index is 27.87 kg/(m^2). Tech Comments:  Patient took Lopressor this AM, Labs Investment banker, corporate Med Study 1 or 2 day study: 1 day  Stress Test Type:  Lexiscan  Reading MD: Willa Rough, MD  Order Authorizing Provider:  [name], MD  Resting Radionuclide: Technetium 25m Sestamibi  Resting Radionuclide Dose: 11.0 mCi   Stress Radionuclide:  Technetium 63m Sestamibi  Stress Radionuclide Dose: 33.0 mCi           Stress Protocol Rest HR: 57 Stress HR: 92  Rest BP: 150/78 Stress BP: 172/81  Exercise Time (min): n/a METS: n/a   Predicted Max HR: 137 bpm % Max HR: 67.15 bpm Rate Pressure Product: 40102   Dose of Adenosine (mg):  n/a Dose of Lexiscan: 0.4 mg  Dose of Atropine (mg): n/a Dose of Dobutamine: n/a mcg/kg/min (at max HR)  Stress Test Technologist: Smiley Houseman, CMA-N  Nuclear Technologist:  Leonia Corona, RT-N     Rest Procedure:  Myocardial perfusion imaging was performed at rest 45 minutes following the intravenous administration of Technetium 20m Sestamibi.  Rest  ECG: Sinus bradycardia. Interventricular conduction delay.  Stress Procedure:  The patient received IV Lexiscan 0.4 mg over 15-seconds with concurrent low level exercise and then Technetium 66m Sestamibi was injected at 30-seconds while the patient continued walking one more minute.  Quantitative spect images were obtained after a 45-minute delay.  Stress ECG: No significant change from baseline ECG  QPS Raw Data Images:  Normal; no motion artifact; normal heart/lung ratio. Stress Images:  Normal uptake in all segments. Rest Images:  Normal uptake in all segments. Subtraction (SDS):  No evidence of ischemia. Transient Ischemic Dilatation (Normal <1.22):  n/a Lung/Heart Ratio (Normal <0.45):  0.44  Quantitative Gated Spect Images QGS EDV:  76 ml QGS ESV:  32 ml  Impression Exercise Capacity:  Lexiscan with no exercise. BP Response:  Normal blood pressure response. Clinical Symptoms:  No significant symptoms noted. ECG Impression:  No significant ST segment change suggestive of ischemia. Comparison with Prior Nuclear Study: No images to compare  Overall Impression:  Normal stress nuclear study. this is a low risk scan. There are no diagnostic abnormalities.  LV Ejection Fraction: 58%.  LV Wall Motion:  Normal Wall Motion  Jerral Bonito, MD

## 2013-02-20 LAB — BASIC METABOLIC PANEL
BUN: 37 mg/dL — ABNORMAL HIGH (ref 6–23)
CO2: 21 mEq/L (ref 19–32)
Calcium: 8.9 mg/dL (ref 8.4–10.5)
Chloride: 111 mEq/L (ref 96–112)
Creatinine, Ser: 3.1 mg/dL — ABNORMAL HIGH (ref 0.4–1.5)
GFR: 20.31 mL/min — ABNORMAL LOW (ref >60.00)
Glucose, Bld: 106 mg/dL — ABNORMAL HIGH (ref 70–99)
Potassium: 5.2 mEq/L — ABNORMAL HIGH (ref 3.5–5.1)
Sodium: 138 mEq/L (ref 135–145)

## 2013-02-20 LAB — HEPATIC FUNCTION PANEL
ALT: 16 U/L (ref 0–53)
AST: 18 U/L (ref 0–37)
Albumin: 4 g/dL (ref 3.5–5.2)
Alkaline Phosphatase: 51 U/L (ref 39–117)
Bilirubin, Direct: 0.1 mg/dL (ref 0.0–0.3)
Total Bilirubin: 0.7 mg/dL (ref 0.3–1.2)
Total Protein: 7.3 g/dL (ref 6.0–8.3)

## 2013-02-20 LAB — LIPID PANEL
Cholesterol: 116 mg/dL (ref 0–200)
HDL: 24.8 mg/dL — ABNORMAL LOW (ref >39.00)
LDL Cholesterol: 58 mg/dL (ref 0–99)
Total CHOL/HDL Ratio: 5
Triglycerides: 165 mg/dL — ABNORMAL HIGH (ref 0.0–149.0)
VLDL: 33 mg/dL (ref 0.0–40.0)

## 2013-02-28 ENCOUNTER — Encounter: Payer: Self-pay | Admitting: Cardiology

## 2013-02-28 ENCOUNTER — Ambulatory Visit (INDEPENDENT_AMBULATORY_CARE_PROVIDER_SITE_OTHER): Payer: Medicare Other | Admitting: Cardiology

## 2013-02-28 VITALS — BP 160/70 | Ht 67.5 in | Wt 177.8 lb

## 2013-02-28 DIAGNOSIS — I10 Essential (primary) hypertension: Secondary | ICD-10-CM

## 2013-02-28 DIAGNOSIS — I2510 Atherosclerotic heart disease of native coronary artery without angina pectoris: Secondary | ICD-10-CM

## 2013-02-28 NOTE — Progress Notes (Signed)
HPI The patient presents for followup of his known coronary disease. Since I last saw him he had a stress test.  However he was only able to go for 4 minutes because of blood pressure issues. He did have some chest discomfort. This was a mild substernal aching. He had a stress perfusion study which demonstrated no high-risk findings in a well preserved ejection fraction. He has since had no new chest discomfort. Unfortunately he has had increasing fatigue although this sounds a mild and his expectations at age 79 are very very high. He denies any new shortness of breath, PND or orthopnea. He has had no new complications, presyncope or syncope. He is aware that his creatinine has gone up slightly. However, he had not been following with a nephrologist recently. He does have an inguinal hernia and is considering having this repaired.   No Known Allergies  Current Outpatient Prescriptions  Medication Sig Dispense Refill  . aspirin 325 MG tablet Take 325 mg by mouth daily.        . cholecalciferol (VITAMIN D) 1000 UNITS tablet Take 1,000 Units by mouth daily.      Marland Kitchen CINNAMON PO Take 1 capsule by mouth daily.        . clopidogrel (PLAVIX) 75 MG tablet Take 75 mg by mouth daily.       . COD LIVER OIL PO Take by mouth daily.       . CRESTOR 10 MG tablet Take 5 mg by mouth daily. Not sure of dosage      . Iron Combinations (IRON COMPLEX PO) Take by mouth.      . isosorbide mononitrate (IMDUR) 120 MG 24 hr tablet Take 120 mg by mouth daily.       . metoprolol (LOPRESSOR) 100 MG tablet Take 50 mg by mouth 2 (two) times daily.      . Multiple Vitamins-Minerals (EYE VITAMINS) CAPS Take by mouth.      Marland Kitchen NITROSTAT 0.4 MG SL tablet DISSOLVE ONE TABLET UNDER THE TONGUE EVERY 5 MINUTES AS NEEDED FOR CHEST PAIN.  DO NOT EXCEED A TOTAL OF 3 DOSES IN 15 MINUTES  25 tablet  12  . Omega-3 Fatty Acids (FISH OIL) 1200 MG CAPS Take 1 capsule by mouth daily.        . tamsulosin (FLOMAX) 0.4 MG CAPS capsule Take 0.4 mg  by mouth daily after breakfast.      . vitamin C (ASCORBIC ACID) 500 MG tablet Take 500 mg by mouth daily.         No current facility-administered medications for this visit.    Past Medical History  Diagnosis Date  . HYPERTENSION   . HYPERLIPIDEMIA   . CAD     CABG x 3 in 1997 by Dr. Laneta Simmers; PCI in 2009  . RENAL INSUFFICIENCY   . DM   . Bilateral inguinal hernia (BIH), left scrotal 11/28/2011    Past Surgical History  Procedure Laterality Date  . Appendectomy    . Coronary artery bypass graft    . Cardiac stents placement  2010    ROS:  As stated in the HPI and negative for all other systems.  PHYSICAL EXAM BP 160/70  Ht 5' 7.5" (1.715 m)  Wt 177 lb 12.8 oz (80.65 kg)  BMI 27.42 kg/m2 GENERAL:  Well appearing HEENT:  Pupils equal round and reactive, fundi not visualized, oral mucosa unremarkable NECK:  No jugular venous distention, waveform within normal limits, carotid upstroke brisk and symmetric,  no bruits, no thyromegaly LYMPHATICS:  No cervical, inguinal adenopathy LUNGS:  Clear to auscultation bilaterally BACK:  No CVA tenderness CHEST:  Well healed sternotomy scar. HEART:  PMI not displaced or sustained,S1 and S2 within normal limits, no S3, no S4, no clicks, no rubs, no murmurs ABD:  Flat, positive bowel sounds normal in frequency in pitch, no bruits, no rebound, no guarding, no midline pulsatile mass, no hepatomegaly, no splenomegaly EXT:  2 plus pulses throughout, no edema, no cyanosis no clubbing, missing part of the right thumb SKIN:  No rashes no nodules NEURO:  Cranial nerves II through XII grossly intact, motor grossly intact throughout PSYCH:  Cognitively intact, oriented to person place and time  EKG:  Sinus rhythm, rate 71, right bundle branch block, left anterior fascicular block, QTC slightly prolonged  ASSESSMENT AND PLAN  CAD:  The patient a negative stress perfusion study recently. No further cardiovascular testing is suggested. We will continue  with risk reduction.  CKD:  I have convinced him to followup with a nephrologist. References have been supplied.  HTN:  His blood pressure is elevated. However, he's resistant to taking any more medications than pain. He will continue on the meds as listed.  FATIGUE:  While this could be related to his medications I am reluctant to reduce for instance his dose of beta blocker. He will discuss any possible laboratory testing such as TSH with HOFFMAN,BYRON, MD  PREOP:  The patient would be at acceptable risk for planned inguinal hernia surgery if he chooses to do this

## 2013-02-28 NOTE — Patient Instructions (Addendum)
The current medical regimen is effective;  continue present plan and medications.  Follow up in 1 year with Dr Antoine Poche.  You will receive a letter in the mail 2 months before you are due.  Please call us when you receive this letter to schedule your follow up appointment.  Dr Elvis Coil Dr Delano Metz

## 2013-10-08 ENCOUNTER — Telehealth: Payer: Self-pay | Admitting: Cardiology

## 2013-10-08 NOTE — Telephone Encounter (Signed)
New Message:  Pt's [facility] state [facility] States her husband is not well.. States he "is not walking right"...

## 2013-10-08 NOTE — Telephone Encounter (Signed)
Spoke with wife who states pt woke this am and was "stumbling and walking sideways."  Reports he is better now.  She reports his speech is not slurred, no facial droop, able to stick his tongue out, equal hand strength and no arm drift when holding his arms out in front of him.  She reports he is having some stomach trouble.  She wants the pt to be seen by his primary care dr. In [place] but he has not been able to get an appt.  The telephone call was then disconnected. (wife had stated her phone was dying)  Will attempt to call her back.

## 2013-10-08 NOTE — Telephone Encounter (Signed)
Spoke with wife again - she states at times when pt stands he leans to the right and has to get his balance.  This lasts about a minute or so and then he is OK.  He denies any dizziness.  appt was scheduled for pt in the at 9 am for evaluation.  Wife was reminded to bring pts medications with them to the appt.

## 2013-10-09 ENCOUNTER — Encounter: Payer: Self-pay | Admitting: Cardiology

## 2013-10-09 ENCOUNTER — Ambulatory Visit (INDEPENDENT_AMBULATORY_CARE_PROVIDER_SITE_OTHER): Payer: Medicare Other | Admitting: Cardiology

## 2013-10-09 ENCOUNTER — Encounter: Payer: Self-pay | Admitting: Nurse Practitioner

## 2013-10-09 VITALS — BP 117/65 | HR 63 | Ht 67.5 in | Wt 177.0 lb

## 2013-10-09 DIAGNOSIS — I25810 Atherosclerosis of coronary artery bypass graft(s) without angina pectoris: Secondary | ICD-10-CM

## 2013-10-09 NOTE — Progress Notes (Signed)
HPI The patient presents for followup of his known coronary disease. Prior to the last visit he had a stress test preop for a hernia surgery.    He was only able to go for 4 minutes because of blood pressure issues. He did have some chest discomfort. This was a mild substernal aching. He had a stress perfusion study which demonstrated no high-risk findings in a well preserved ejection fraction.  However, he did not have his hernia surgery yet.  He also did not see the nephrologist who I suggested for follow up of CKD.   He was added to the clinic schedule today because of acute balance issues that he had earlier this week.  He reported that he woke up he felt like he was drifting to the right. He did not report any visual disturbances. There were no speech or motor disturbances. He did not report dizziness feel like things were spinning. He does get occasional dizziness with standing but this has not been severe. He didn't have any presyncope or syncope. His symptoms resolved and he has had no further deficit. He has not had these before.   No Known Allergies  Current Outpatient Prescriptions  Medication Sig Dispense Refill  . aspirin 325 MG tablet Take 325 mg by mouth daily.        . cholecalciferol (VITAMIN D) 1000 UNITS tablet Take 1,000 Units by mouth daily.      Marland Kitchen. CINNAMON PO Take 1 capsule by mouth daily.        . clopidogrel (PLAVIX) 75 MG tablet Take 75 mg by mouth daily.       . COD LIVER OIL PO Take by mouth daily.       . CRESTOR 10 MG tablet Take 5 mg by mouth daily. Not sure of dosage      . Iron Combinations (IRON COMPLEX PO) Take by mouth.      . isosorbide mononitrate (IMDUR) 120 MG 24 hr tablet Take 120 mg by mouth daily.       . metoprolol-hydrochlorothiazide (LOPRESSOR HCT) 50-25 MG per tablet Take 1 tablet by mouth daily.       . Multiple Vitamins-Minerals (EYE VITAMINS) CAPS Take by mouth.      Marland Kitchen. NITROSTAT 0.4 MG SL tablet DISSOLVE ONE TABLET UNDER THE TONGUE EVERY 5  MINUTES AS NEEDED FOR CHEST PAIN.  DO NOT EXCEED A TOTAL OF 3 DOSES IN 15 MINUTES  25 tablet  12  . tamsulosin (FLOMAX) 0.4 MG CAPS capsule Take 0.4 mg by mouth daily after breakfast.      . vitamin C (ASCORBIC ACID) 500 MG tablet Take 500 mg by mouth daily.         No current facility-administered medications for this visit.    Past Medical History  Diagnosis Date  . HYPERTENSION   . HYPERLIPIDEMIA   . CAD     CABG x 3 in 1997 by Dr. Laneta SimmersBartle; PCI in 2009  . RENAL INSUFFICIENCY   . DM   . Bilateral inguinal hernia (BIH), left scrotal 11/28/2011    Past Surgical History  Procedure Laterality Date  . Appendectomy    . Coronary artery bypass graft    . Cardiac stents placement  2010    ROS:  As stated in the HPI and negative for all other systems.  PHYSICAL EXAM BP 104/62  Pulse 71  Ht 5' 7.5" (1.715 m)  Wt 177 lb (80.287 kg)  BMI 27.30 kg/m2 GENERAL:  Well appearing  HEENT:  Pupils equal round and reactive, fundi not visualized, oral mucosa unremarkable NECK:  No jugular venous distention, waveform within normal limits, carotid upstroke brisk and symmetric, no bruits, no thyromegaly LYMPHATICS:  No cervical, inguinal adenopathy LUNGS:  Clear to auscultation bilaterally BACK:  No CVA tenderness CHEST:  Well healed sternotomy scar. HEART:  PMI not displaced or sustained,S1 and S2 within normal limits, no S3, no S4, no clicks, no rubs, no murmurs ABD:  Flat, positive bowel sounds normal in frequency in pitch, no bruits, no rebound, no guarding, no midline pulsatile mass, no hepatomegaly, no splenomegaly EXT:  2 plus pulses throughout, no edema, no cyanosis no clubbing, missing part of the right thumb SKIN:  No rashes no nodules NEURO:  Cranial nerves II through XII grossly intact, motor grossly intact throughout PSYCH:  Cognitively intact, oriented to person place and time  EKG:  Sinus rhythm, rate 64, right bundle branch block, left anterior fascicular block, lethargy  hypertrophy by voltage criteria, no acute ST-T wave changes. No change from previous. 10/09/2013   ASSESSMENT AND PLAN  DECREASED BALANCE:  At this point are not clear the etiology. He was not orthostatic.   However, he has no neurologic deficit that would suggest stroke.  I will defer further workup to his primary provider.  CAD:  The patient a negative stress perfusion study recently. No further cardiovascular testing is suggested. We will continue with risk reduction.  CKD:  I have again encouraged him to followup with a nephrologist.   HTN:  His blood pressure is at target. However, it has been elevated previously. I am reluctant to reduce his medications although if his blood pressure remains low with his continued fatigue we could reduce his beta blocker going forward.  FATIGUE:  I will [facility]

## 2013-10-09 NOTE — Patient Instructions (Signed)
Your physician recommends that you continue on your current medications as directed. Please refer to the Current Medication list given to you today.  Follow up in September, you will receive a letter 2 months prior to call and schedule. If you do not call our office to set up follow up

## 2013-10-15 ENCOUNTER — Other Ambulatory Visit: Payer: Self-pay | Admitting: Cardiovascular Disease

## 2013-11-11 ENCOUNTER — Other Ambulatory Visit: Payer: Self-pay | Admitting: Cardiovascular Disease

## 2013-11-14 ENCOUNTER — Other Ambulatory Visit: Payer: Self-pay

## 2013-11-14 MED ORDER — NITROSTAT 0.4 MG SL SUBL: SUBLINGUAL_TABLET | SUBLINGUAL | Status: AC

## 2014-02-02 ENCOUNTER — Other Ambulatory Visit: Payer: Self-pay | Admitting: *Deleted

## 2014-02-02 MED ORDER — ROSUVASTATIN CALCIUM 10 MG PO TABS: 5.0000 mg | ORAL_TABLET | Freq: Every day | ORAL | Status: DC

## 2014-02-12 ENCOUNTER — Telehealth: Payer: Self-pay | Admitting: Cardiology

## 2014-02-12 MED ORDER — CLOPIDOGREL BISULFATE 75 MG PO TABS: 75.0000 mg | ORAL_TABLET | Freq: Every day | ORAL | Status: DC

## 2014-02-12 NOTE — Telephone Encounter (Signed)
Calling because [name] is needing his Plavix refilled . They have called [facility] and they stated that they have sent the request for the refilled .Marland Kitchen. Please call .  Thanks

## 2014-02-12 NOTE — Telephone Encounter (Signed)
Rx refill sent to patient pharmacy   

## 2014-03-03 ENCOUNTER — Telehealth: Payer: Self-pay | Admitting: Cardiology

## 2014-03-03 DIAGNOSIS — N289 Disorder of kidney and ureter, unspecified: Secondary | ICD-10-CM

## 2014-03-03 NOTE — Telephone Encounter (Signed)
Returned call to patient's wife.She stated husband needs to be referred to a PCP.Stated [facility] does not take new patients.Also has been a long time since husband has seen a kidney Dr.Stated wanted to be referred to a different kidney Dr.Message sent to Dr.Hochrein for advice.

## 2014-03-03 NOTE — Telephone Encounter (Signed)
Please call, need you to refer him to a primary care doctor and a kidney specalist.

## 2014-03-04 NOTE — Telephone Encounter (Signed)
Dr. Allena Katz for nephrology.  Give him a list of the [facility] [place]

## 2014-03-05 NOTE — Telephone Encounter (Signed)
Pt. Called and given Dr. Eliane Decree phone number

## 2014-03-17 ENCOUNTER — Ambulatory Visit (INDEPENDENT_AMBULATORY_CARE_PROVIDER_SITE_OTHER): Payer: Medicare Other | Admitting: Cardiology

## 2014-03-17 ENCOUNTER — Encounter: Payer: Self-pay | Admitting: Cardiology

## 2014-03-17 VITALS — BP 128/70 | HR 64 | Ht 68.0 in | Wt 167.0 lb

## 2014-03-17 DIAGNOSIS — I2583 Coronary atherosclerosis due to lipid rich plaque: Principal | ICD-10-CM

## 2014-03-17 DIAGNOSIS — I2510 Atherosclerotic heart disease of native coronary artery without angina pectoris: Secondary | ICD-10-CM

## 2014-03-17 DIAGNOSIS — I25810 Atherosclerosis of coronary artery bypass graft(s) without angina pectoris: Secondary | ICD-10-CM

## 2014-03-17 NOTE — Progress Notes (Signed)
HPI The patient presents for followup of his known coronary disease.  Since  he had a stress perfusion study last year, which demonstrated no high-risk findings in a well preserved ejection fraction, he has had no chest pain. He is still riding his bike daily.  His big issue continues to be fatigue. He wants to nap frequently.  He is not describing a balance issue he had previously. The patient denies any new symptoms such as chest discomfort, neck or arm discomfort. There has been no new shortness of breath, PND or orthopnea. There have been no reported palpitations, presyncope or syncope.  No Known Allergies  Current Outpatient Prescriptions  Medication Sig Dispense Refill  . aspirin 325 MG tablet Take 325 mg by mouth daily.        . cholecalciferol (VITAMIN D) 1000 UNITS tablet Take 1,000 Units by mouth daily.      Marland Kitchen CINNAMON PO Take 1 capsule by mouth daily.        . clopidogrel (PLAVIX) 75 MG tablet Take 1 tablet (75 mg total) by mouth daily.  30 tablet  3  . COD LIVER OIL PO Take by mouth daily.       . Iron Combinations (IRON COMPLEX PO) Take by mouth.      . isosorbide mononitrate (IMDUR) 120 MG 24 hr tablet Take 120 mg by mouth daily.       . metoprolol-hydrochlorothiazide (LOPRESSOR HCT) 50-25 MG per tablet Take 1 tablet by mouth daily.       . Multiple Vitamins-Minerals (EYE VITAMINS) CAPS Take by mouth.      Marland Kitchen NITROSTAT 0.4 MG SL tablet DISSOLVE ONE TABLET UNDER THE TONGUE EVERY 5 MINUTES AS NEEDED FOR CHEST PAIN.  DO NOT EXCEED A TOTAL OF 3 DOSES IN 15 MINUTES  25 tablet  0  . rosuvastatin (CRESTOR) 10 MG tablet Take 0.5 tablets (5 mg total) by mouth daily.  45 tablet  0  . vitamin C (ASCORBIC ACID) 500 MG tablet Take 500 mg by mouth daily.         No current facility-administered medications for this visit.    Past Medical History  Diagnosis Date  . HYPERTENSION   . HYPERLIPIDEMIA   . CAD     CABG x 3 in 1997 by Dr. Laneta Simmers; PCI in 2009  . RENAL INSUFFICIENCY   . DM     . Bilateral inguinal hernia (BIH), left scrotal 11/28/2011    Past Surgical History  Procedure Laterality Date  . Appendectomy    . Coronary artery bypass graft    . Cardiac stents placement  2010    ROS:  As stated in the HPI and negative for all other systems.  PHYSICAL EXAM BP 128/70  Pulse 64  Ht  (1.727 m)  Wt 167 lb (75.751 kg)  BMI 25.40 kg/m2 GENERAL:  Well appearing HEENT:  Pupils equal round and reactive, fundi not visualized, oral mucosa unremarkable NECK:  No jugular venous distention, waveform within normal limits, carotid upstroke brisk and symmetric, no bruits, no thyromegaly LYMPHATICS:  No cervical, inguinal adenopathy LUNGS:  Clear to auscultation bilaterally BACK:  No CVA tenderness CHEST:  Well healed sternotomy scar. HEART:  PMI not displaced or sustained,S1 and S2 within normal limits, no S3, no S4, no clicks, no rubs, no murmurs ABD:  Flat, positive bowel sounds normal in frequency in pitch, no bruits, no rebound, no guarding, no midline pulsatile mass, no hepatomegaly, no splenomegaly EXT:  2 plus pulses  throughout, no edema, no cyanosis no clubbing, missing part of the right thumb SKIN:  No rashes no nodules NEURO:  Cranial nerves II through XII grossly intact, motor grossly intact throughout PSYCH:  Cognitively intact, oriented to person place and time  EKG:  Sinus rhythm, rate 64, right bundle branch block, left anterior fascicular block, lethargy hypertrophy by voltage criteria, no acute ST-T wave changes. No change from previous. 03/17/2014   ASSESSMENT AND PLAN   CAD:  The patient a negative stress perfusion study last year. No further cardiovascular testing is suggested. We will continue with risk reduction.  CKD:  I have again encouraged him to followup with a nephrologist and he does have this scheduled  HTN:  His blood pressure is at target. He will remain on the meds as lsited.  FATIGUE:  I will defer further workup to [facility],  MD.  However, I did suggest that he could hold his statin for 6 weeks to see if he is having less fatigue off of this medication.

## 2014-03-17 NOTE — Patient Instructions (Signed)
Your physician recommends that you schedule a follow-up appointment in: 6 months with Dr. Antoine Poche  Hold your cholesterol medication for 6 weeks

## 2014-05-29 ENCOUNTER — Telehealth: Payer: Self-pay | Admitting: Cardiology

## 2014-05-29 NOTE — Telephone Encounter (Signed)
Please call,concerning his medicine.His other doctors have some concerns about hi medicine.

## 2014-05-29 NOTE — Telephone Encounter (Signed)
Phone rang x 6, no answer

## 2014-05-29 NOTE — Telephone Encounter (Signed)
Wife called   She states that patient has seen a new  Nephrologist Dr Allena KatzPatel ( taking over Dr Osborne Omancoloc as well as primary Dr Mikey BussingHoffman. Per wife ,both doctors wanted her to call about patient taking Metoprolol- HCT. Kidney function decreasing.  she states she does not have any numbers from labs.  At the present time patient is being treated for shingles, pain at the site ( arm), no draining areas. Wife aware will defer to Dr Antoine PocheHochrein.  Recall for march 2016

## 2014-06-01 NOTE — Telephone Encounter (Signed)
He should remain on the same dose of beta blocker unless he is having significant bradycardia or hypotension.

## 2014-06-01 NOTE — Telephone Encounter (Signed)
Pt given instructions from Dr. Antoine PocheHochrein about his metoprolol

## 2014-07-20 ENCOUNTER — Other Ambulatory Visit: Payer: Self-pay | Admitting: Cardiology

## 2014-07-22 ENCOUNTER — Telehealth: Payer: Self-pay | Admitting: Cardiology

## 2014-07-22 NOTE — Telephone Encounter (Signed)
Please call, pt is having tightness in chest. When pt take nitro it goes away.Pt also have had shingles since 05-02-14,lost a lot of weight.

## 2014-07-22 NOTE — Telephone Encounter (Signed)
Attempted to call pt. About his tightness in his chest, no answer and his voice mail hasn't been set up yet, so I was not able to talk with him

## 2014-07-22 NOTE — Telephone Encounter (Signed)
Pt. States hes been having a lot of chest pressure and has been taking a lot of ntg, pt. advised to go to the Emergency room for evaluation, pt. Stated he just wanted to come in to see Dr. Antoine PocheHochrein earlier than march, I told pt I would see about getting him an appt.  next week

## 2014-07-22 NOTE — Telephone Encounter (Signed)
Pt. Needs an appt with Dr. Antoine PocheHochrein next week, please call pt or pt.s wife and give her the time to be here. Thank you

## 2014-07-24 ENCOUNTER — Telehealth: Payer: Self-pay | Admitting: *Deleted

## 2014-07-24 NOTE — Telephone Encounter (Signed)
I have talked with the Wife of this pt. And she told me that her husband has been having more and more episodes of chest discomfort upon exertion and needs to be seen , I then spoke with the pt and offered him an appt today with [place] PA at 3:30 pm , pt was very arguemenative and stated We should be able to treat him over the phone and doesn't want to go to the ER or come to the office to be evaluated, pt informed that with the presents of Chest Discomfort it's best to be evaulated in person and then proper treatment can be provided, pt stated he would call back today and let me know what he's has decided to do as far as coming today or tuesday

## 2014-08-28 ENCOUNTER — Other Ambulatory Visit: Payer: Self-pay | Admitting: Cardiology

## 2014-08-28 NOTE — Telephone Encounter (Signed)
Rx has been sent to the pharmacy electronically. ° °

## 2014-08-31 ENCOUNTER — Other Ambulatory Visit: Payer: Self-pay | Admitting: Cardiology

## 2014-08-31 MED ORDER — CLOPIDOGREL BISULFATE 75 MG PO TABS: 75.0000 mg | ORAL_TABLET | Freq: Every day | ORAL | Status: DC

## 2014-08-31 NOTE — Telephone Encounter (Signed)
°  1. Which medications need to be refilled? Clopedigrel  2. Which pharmacy is medication to be sent to[place] [place]  3. Do they need a 30 day or 90 day supply? 30  4. Would they like a call back once the medication has been sent to the pharmacy? yes

## 2014-08-31 NOTE — Telephone Encounter (Signed)
Rx(s) sent to pharmacy electronically. Patient notified of refill. I advised patient/patient's wife that a DPR form will need to be filled out/signed so we could speak with her regarding her husband.

## 2014-09-07 ENCOUNTER — Encounter: Payer: Self-pay | Admitting: Cardiology

## 2014-09-07 ENCOUNTER — Ambulatory Visit (INDEPENDENT_AMBULATORY_CARE_PROVIDER_SITE_OTHER): Payer: Medicare Other | Admitting: Cardiology

## 2014-09-07 VITALS — BP 154/86 | HR 82 | Ht 68.0 in | Wt 169.0 lb

## 2014-09-07 DIAGNOSIS — R5382 Chronic fatigue, unspecified: Secondary | ICD-10-CM | POA: Diagnosis not present

## 2014-09-07 DIAGNOSIS — R002 Palpitations: Secondary | ICD-10-CM

## 2014-09-07 MED ORDER — METOPROLOL TARTRATE 100 MG PO TABS: 150.0000 mg | ORAL_TABLET | Freq: Two times a day (BID) | ORAL | Status: DC

## 2014-09-07 NOTE — Patient Instructions (Addendum)
Your physician recommends that you schedule a follow-up appointment in: 2 months with Dr. Hoyle BarrHochrein  You will come back in two weeks to have a 24 hr monitor placed  Stop taking your Plavix and change your ASA to 81 mg daily

## 2014-09-07 NOTE — Progress Notes (Signed)
HPI The patient presents for followup of his known coronary disease.  He had a stress perfusion study in 2014, which demonstrated no high-risk findings in a well preserved ejection fractio.  However, he has since had a new diagnosis of atrial fibrillation. He saw Dr. Mikey Bussing and was started on Eliquis . He does not notice any irregular heart rate unless he is taking his blood pressure. He's not having any new palpitations, presyncope or syncope. He denies any chest pressure, neck or arm discomfort. He had a long bout with shingles. He was laid up with this and made even weaker than usual. His biggest complaint continues to be fatigue. He's not had any presyncope or syncope. He denies any chest pressure, neck or arm discomfort.  Of note the patient was taking his metoprolol 25 mg 4 times daily recently although I can clarify from where this instruction came.  No Known Allergies  Current Outpatient Prescriptions  Medication Sig Dispense Refill  . apixaban (ELIQUIS) 2.5 MG TABS tablet Take 2.5 mg by mouth 2 (two) times daily.    Marland Kitchen aspirin 325 MG tablet Take 325 mg by mouth daily.      . cholecalciferol (VITAMIN D) 1000 UNITS tablet Take 1,000 Units by mouth daily.    . clopidogrel (PLAVIX) 75 MG tablet Take 1 tablet (75 mg total) by mouth daily. 30 tablet 6  . COD LIVER OIL PO Take by mouth daily.     . isosorbide mononitrate (IMDUR) 120 MG 24 hr tablet Take 120 mg by mouth daily.     . metoprolol-hydrochlorothiazide (LOPRESSOR HCT) 50-25 MG per tablet Take 1 tablet by mouth daily.     . Multiple Vitamins-Minerals (EYE VITAMINS) CAPS Take by mouth.    Marland Kitchen NITROSTAT 0.4 MG SL tablet DISSOLVE ONE TABLET UNDER THE TONGUE EVERY 5 MINUTES AS NEEDED FOR CHEST PAIN.  DO NOT EXCEED A TOTAL OF 3 DOSES IN 15 MINUTES 25 tablet 0  . SODIUM BICARBONATE PO Take by mouth.    . vitamin C (ASCORBIC ACID) 500 MG tablet Take 500 mg by mouth daily.       No current facility-administered medications for this visit.     Past Medical History  Diagnosis Date  . HYPERTENSION   . HYPERLIPIDEMIA   . CAD     CABG x 3 in 1997 by Dr. Laneta Simmers; PCI in 2009  . RENAL INSUFFICIENCY   . DM   . Bilateral inguinal hernia (BIH), left scrotal 11/28/2011    Past Surgical History  Procedure Laterality Date  . Appendectomy    . Coronary artery bypass graft    . Cardiac stents placement  2010    ROS:  As stated in the HPI and negative for all other systems.  PHYSICAL EXAM BP 154/86 mmHg  Pulse 82  Ht  (1.727 m)  Wt 169 lb (76.658 kg)  BMI 25.70 kg/m2 GENERAL:  Well appearing NECK:  No jugular venous distention, waveform within normal limits, carotid upstroke brisk and symmetric, no bruits, no thyromegaly LYMPHATICS:  No cervical, inguinal adenopathy LUNGS:  Clear to auscultation bilaterally BACK:  No CVA tenderness CHEST:  Well healed sternotomy scar. HEART:  PMI not displaced or sustained,S1 and S2 within normal limits, no S3, no clicks, no rubs, no murmurs, irregular ABD:  Flat, positive bowel sounds normal in frequency in pitch, no bruits, no rebound, no guarding, no midline pulsatile mass, no hepatomegaly, no splenomegaly EXT:  2 plus pulses throughout, no edema, no cyanosis no  clubbing, missing part of the right thumb SKIN:  No rashes no nodules NEURO:  Cranial nerves II through XII grossly intact, motor grossly intact throughout PSYCH:  Cognitively intact, oriented to person place and time  EKG:  Atrial fibrillation, rate 112, left axis deviation, right bundle branch block, left anterior fascicular block, no acute ST-T wave changes. 09/07/2014   ASSESSMENT AND PLAN  ATRIAL FIB:  This is a new diagnosis.  [name] [name] has a CHA2DS2 - VASc score of 5 with a risk of stroke of 6.7% .  His creat clearance is at best 17.4.  Given the fact that there is little experience with Eliquis or other NOACs at this level of renal dysfunction I would not suggest this medication.  I will notify Dr. Mikey BussingHoffman  and suggest that he change to warfarin.  The patient will reduce the ASA to 81 mg and stop the Plavix.  I will change to Toprol XL 150 mg for rate control and check a  Holter in about two weeks.    CAD:  The patient a negative stress perfusion study in 2014. No further cardiovascular testing is suggested. We will continue with risk reduction.  CKD:  As above.    HTN:  His blood pressure is elevated.  However, this will be addressed in the context of treating his atrial fibrillation rate.  FATIGUE:  This is unchanged.    (Greater than 40 minutes reviewing all data with greater than 50% face to face with the patient).

## 2014-09-09 ENCOUNTER — Telehealth: Payer: Self-pay | Admitting: Cardiology

## 2014-09-09 NOTE — Telephone Encounter (Signed)
Pt's wife called in stating that the pt will now be taking Warfarin and it was not called to the pharmacy after his appt on 3/14. [facility] [place]. Please call  Thanks

## 2014-09-09 NOTE — Telephone Encounter (Signed)
  Discussed w/ JC - Dr. Jenene SlickerHochrein's instructions were to follow up with Dr. Mikey BussingHoffman for OK on starting warfarin.  Attempted to reach pt, VM Box not set up.

## 2014-09-10 ENCOUNTER — Telehealth: Payer: Self-pay | Admitting: Cardiology

## 2014-09-10 NOTE — Telephone Encounter (Signed)
Spoke w/ pt's wife. Explained that Dr. Antoine PocheHochrein had deferred decision on warfarin to Dr. Mikey BussingHoffman, pt should follow up w/ their office if haven't heard anything. She voiced understanding w/ this plan.

## 2014-09-10 NOTE — Telephone Encounter (Signed)
Called pt number - no VM box setup.  Called back to Emergency contact - still no answer.  Called emergency contact work number - goes to [employer] dial.

## 2014-09-10 NOTE — Telephone Encounter (Signed)
Pt's wife is calling in once again to inquire about receiving a prescription for Warfarin since Dr. Antoine PocheHochrein wanted the pt to stop taking the Eliquis. This is the 3rd time she has called in and she is worried that she may have misunderstood Dr. Jenene SlickerHochrein's recommendations. Please call and f/u with her  Thanks

## 2014-09-10 NOTE — Telephone Encounter (Signed)
Again attempted to reach pt. Call goes to VM box that is not set up. Encountered this issue yesterday.   Routing back to Operators: If pt/wife calls back, please patch through to a triage nurse.

## 2014-09-15 NOTE — Addendum Note (Signed)
Addended byMarella Bile: Marchelle Rinella W. on: 09/15/2014 10:44 AM   Modules accepted: Orders

## 2014-09-25 ENCOUNTER — Other Ambulatory Visit: Payer: Self-pay | Admitting: *Deleted

## 2014-09-25 ENCOUNTER — Ambulatory Visit: Payer: Medicare Other | Admitting: *Deleted

## 2014-09-25 DIAGNOSIS — R002 Palpitations: Secondary | ICD-10-CM

## 2014-09-25 NOTE — Progress Notes (Signed)
Pt presented for 24 hr holter monitor application

## 2014-09-25 NOTE — Patient Instructions (Signed)
Holter Monitoring A Holter monitor is a small device with electrodes (small sticky patches) that attach to your chest. It records the electrical activity of your heart and is worn continuously for 24-48 hours.  A HOLTER MONITOR IS USED TO  Detect heart problems such as:  Heart arrhythmia. Is an abnormal or irregular heartbeat. With some heart arrhythmias, you may not feel or know that you have an irregular heart rhythm.  Palpitations, such as feeling your heart racing or fluttering. It is possible to have heart palpitations and not have a heart arrhythmia.  A heart rhythm that is too slow or too fast.  If you have problems fainting, near fainting or feeling light-headed, a Holter monitor may be worn to see if your heart is the cause. HOLTER MONITOR PREPARATION   Electrodes will be attached to the skin on your chest.  If you have hair on your chest, small areas may have to be shaved. This is done to help the patches stick better and make the recording more accurate.  The electrodes are attached by wires to the Holter monitor. The Holter monitor clips to your clothing. You will wear the monitor at all times, even while exercising and sleeping. HOME CARE INSTRUCTIONS   Wear your monitor at all times.  The wires and the monitor must stay dry. Do not get the monitor wet.  Do not bathe, swim or use a hot tub with it on.  You may do a "sponge" bath while you have the monitor on.  Keep your skin clean, do not put body lotion or moisturizer on your chest.  It's possible that your skin under the electrodes could become irritated. To keep this from happening, you may put the electrodes in slightly different places on your chest.  Your caregiver will also ask you to keep a diary of your activities, such as walking or doing chores. Be sure to note what you are doing if you experience heart symptoms such as palpitations. This will help your caregiver determine what might be contributing to your  symptoms. The information stored in your monitor will be reviewed by your caregiver alongside your diary entries.  Make sure the monitor is safely clipped to your clothing or in a location close to your body that your caregiver recommends.  The monitor and electrodes are removed when the test is over. Return the monitor as directed.  Be sure to follow up with your caregiver and discuss your Holter monitor results. SEEK IMMEDIATE MEDICAL CARE IF:  You faint or feel lightheaded.  You have trouble breathing.  You get pain in your chest, upper arm or jaw.  You feel sick to your stomach and your skin is pale, cool, or damp.  You think something is wrong with the way your heart is beating. MAKE SURE YOU:   Understand these instructions.  Will watch your condition.  Will get help right away if you are not doing well or get worse. Document Released: 03/10/2004 Document Revised: 09/04/2011 Document Reviewed: 07/23/2008 ExitCare Patient Information 2015 [employer]. This information is not intended to replace advice given to you by your health care provider. Make sure you discuss any questions you have with your health care provider.  

## 2014-09-28 ENCOUNTER — Ambulatory Visit: Payer: Medicare Other | Admitting: Cardiology

## 2014-11-11 ENCOUNTER — Ambulatory Visit: Payer: Medicare Other | Admitting: Cardiology

## 2015-01-06 ENCOUNTER — Ambulatory Visit (INDEPENDENT_AMBULATORY_CARE_PROVIDER_SITE_OTHER): Payer: Medicare Other | Admitting: Cardiology

## 2015-01-06 ENCOUNTER — Encounter: Payer: Self-pay | Admitting: Cardiology

## 2015-01-06 VITALS — BP 118/76 | HR 96 | Ht 67.0 in | Wt 163.0 lb

## 2015-01-06 DIAGNOSIS — I4891 Unspecified atrial fibrillation: Secondary | ICD-10-CM | POA: Insufficient documentation

## 2015-01-06 DIAGNOSIS — I481 Persistent atrial fibrillation: Secondary | ICD-10-CM

## 2015-01-06 DIAGNOSIS — I4819 Other persistent atrial fibrillation: Secondary | ICD-10-CM

## 2015-01-06 NOTE — Progress Notes (Signed)
HPI The patient presents for followup of his known coronary disease.  He had a stress perfusion study in 2014, which demonstrated no high-risk findings in a well preserved ejection fraction.  He was recently diagnosed with atrial fibrillation. He has been on warfarin since I last saw him. He had a Holter monitor which demonstrated reasonable rate control. He does not feel that fibrillation. The patient denies any new symptoms such as chest discomfort, neck or arm discomfort. There has been no new shortness of breath, PND or orthopnea. There have been no reported palpitations, presyncope or syncope.  He does feel fatigue which is unchanged from previous. He still battling shingles. However, he starting to feel a little bit better and able to do a little bit more around the house like we it without significant complaints.  Of note he has had some increased bruising slightly when he bumps his hand.   No Known Allergies  Current Outpatient Prescriptions  Medication Sig Dispense Refill  . aspirin 81 MG tablet Take 81 mg by mouth daily.    . cholecalciferol (VITAMIN D) 1000 UNITS tablet Take 1,000 Units by mouth daily.    . isosorbide mononitrate (IMDUR) 120 MG 24 hr tablet Take 120 mg by mouth daily.     . metoprolol (LOPRESSOR) 100 MG tablet Take 1.5 tablets (150 mg total) by mouth 2 (two) times daily. 180 tablet 3  . Multiple Vitamins-Minerals (EYE VITAMINS) CAPS Take by mouth.    Marland Kitchen NITROSTAT 0.4 MG SL tablet DISSOLVE ONE TABLET UNDER THE TONGUE EVERY 5 MINUTES AS NEEDED FOR CHEST PAIN.  DO NOT EXCEED A TOTAL OF 3 DOSES IN 15 MINUTES 25 tablet 0  . SODIUM BICARBONATE PO Take by mouth.    . vitamin C (ASCORBIC ACID) 500 MG tablet Take 500 mg by mouth daily.      . WARFARIN SODIUM PO Take by mouth 2 (two) times a week.     No current facility-administered medications for this visit.    Past Medical History  Diagnosis Date  . HYPERTENSION   . HYPERLIPIDEMIA   . CAD     CABG x 3 in 1997 by Dr.  Laneta Simmers; PCI in 2009  . RENAL INSUFFICIENCY   . DM   . Bilateral inguinal hernia (BIH), left scrotal 11/28/2011    Past Surgical History  Procedure Laterality Date  . Appendectomy    . Coronary artery bypass graft    . Cardiac stents placement  2010    ROS:  As stated in the HPI and negative for all other systems.  PHYSICAL EXAM BP 118/76 mmHg  Pulse 96  Ht  (1.702 m)  Wt 163 lb (73.936 kg)  BMI 25.52 kg/m2 GENERAL:  Well appearing NECK:  No jugular venous distention, waveform within normal limits, carotid upstroke brisk and symmetric, no bruits, no thyromegaly LYMPHATICS:  No cervical, inguinal adenopathy LUNGS:  Clear to auscultation bilaterally BACK:  No CVA tenderness CHEST:  Well healed sternotomy scar. HEART:  PMI not displaced or sustained,S1 and S2 within normal limits, no S3, no clicks, no rubs, no murmurs, irregular ABD:  Flat, positive bowel sounds normal in frequency in pitch, no bruits, no rebound, no guarding, no midline pulsatile mass, no hepatomegaly, no splenomegaly EXT:  2 plus pulses throughout, no edema, no cyanosis no clubbing, missing part of the right thumb SKIN:  No rashes no nodules NEURO:  Cranial nerves II through XII grossly intact, motor grossly intact throughout PSYCH:  Cognitively intact, oriented  to person place and time   ASSESSMENT AND PLAN  ATRIAL FIB:  This is a new diagnosis.  [name] has a CHA2DS2 - VASc score of 5 with a risk of stroke of 6.7% . He was initially started on Eliquis but he has reduced renal function and was switched to warfarin. Holter monitor demonstrated reasonable rate control.  I will stop the ASA.    CAD:  The patient a negative stress perfusion study in 2014. No further cardiovascular testing is suggested. We will continue with risk reduction.  CKD:  [facility]  HTN:  The blood pressure is at target. No change in medications is indicated. We will continue with therapeutic  lifestyle changes (TLC).  FATIGUE:  This is unchanged.  I discussed that this could be related to beta blocker but he does not want to switch this at this point.

## 2015-01-06 NOTE — Patient Instructions (Signed)
Your physician wants you to follow-up in: 6 Months. You will receive a reminder letter in the mail two months in advance. If you don't receive a letter, please call our office to schedule the follow-up appointment.  Your physician has recommended you make the following change in your medication: STOP Aspirin

## 2015-05-11 ENCOUNTER — Telehealth: Payer: Self-pay | Admitting: Cardiology

## 2015-05-11 NOTE — Telephone Encounter (Signed)
Pt needs clearance for oral surgery. Please fax to [phone]

## 2015-05-11 NOTE — Telephone Encounter (Signed)
.  Surgical clearance request:  Oral surgery by Dr. Ebbie RidgeBonaventura - Pt having extraction of 4 teeth under IV sedation in office.  Dr. Ebbie RidgeBonaventura is not requesting patient to hold warfarin, can continue w/o interruption. This procedure as yet unscheduled, awaitng clearance.  Routing to Dr. Antoine PocheHochrein for OK.

## 2015-05-12 ENCOUNTER — Encounter: Payer: Self-pay | Admitting: *Deleted

## 2015-05-12 NOTE — Telephone Encounter (Signed)
[place] call from [place] today. Returned call to notify of clearance, office closed.

## 2015-05-12 NOTE — Telephone Encounter (Signed)
OK to have teeth pulled.

## 2015-05-12 NOTE — Telephone Encounter (Signed)
Clearance faxed to Dr. Merlene MorseBonaventura's office.

## 2015-05-13 ENCOUNTER — Encounter: Payer: Self-pay | Admitting: Cardiology

## 2015-05-13 NOTE — Telephone Encounter (Signed)
This encounter was created in error - please disregard.

## 2015-05-15 ENCOUNTER — Other Ambulatory Visit: Payer: Self-pay | Admitting: Cardiology

## 2015-07-09 ENCOUNTER — Encounter: Payer: Self-pay | Admitting: Cardiology

## 2015-07-09 ENCOUNTER — Ambulatory Visit (INDEPENDENT_AMBULATORY_CARE_PROVIDER_SITE_OTHER): Payer: Medicare Other | Admitting: Cardiology

## 2015-07-09 VITALS — BP 132/76 | HR 100 | Ht 68.0 in | Wt 161.2 lb

## 2015-07-09 DIAGNOSIS — R002 Palpitations: Secondary | ICD-10-CM | POA: Diagnosis not present

## 2015-07-09 MED ORDER — METOPROLOL SUCCINATE ER 200 MG PO TB24: 200.0000 mg | ORAL_TABLET | Freq: Every day | ORAL | Status: DC

## 2015-07-09 NOTE — Progress Notes (Signed)
HPI The patient presents for followup of his known coronary disease.  He had a stress perfusion study in 2014, which demonstrated no high-risk findings in a well preserved ejection fraction.  He was recently diagnosed with atrial fibrillation.  He was on warfarin. However, recently he has some GI bleeding. Don't have these records. He was taken off of the warfarin and is going to have further workup with probable colonoscopy. He reported that all of this followed a diarrheal episode with possible virus. He was also going to have some teeth extracted but this was put off because he was hypertensive when he came to have that done. Finally he's having worsening problems for many hernia and wants eventually to have that reduced surgically. He denies any ongoing chest pressure, neck or arm discomfort. His biggest complaint continues to be fatigue. He's had no new shortness of breath though he has chronic dyspnea. He's had no PND or orthopnea. He does occasionally notice some palpitations perhaps at night and he has to stop and relax and let things go away. This is probably the first time he's noticing fibrillation but the symptoms did not last for a long time.   No Known Allergies  Current Outpatient Prescriptions  Medication Sig Dispense Refill  . cholecalciferol (VITAMIN D) 1000 UNITS tablet Take 1,000 Units by mouth daily.    . isosorbide mononitrate (IMDUR) 120 MG 24 hr tablet Take 120 mg by mouth daily.     . metoprolol (LOPRESSOR) 100 MG tablet TAKE ONE & ONE-HALF TABLETS BY MOUTH TWICE DAILY 180 tablet 0  . Multiple Vitamins-Minerals (EYE VITAMINS) CAPS Take by mouth.    Marland Kitchen NITROSTAT 0.4 MG SL tablet DISSOLVE ONE TABLET UNDER THE TONGUE EVERY 5 MINUTES AS NEEDED FOR CHEST PAIN.  DO NOT EXCEED A TOTAL OF 3 DOSES IN 15 MINUTES 25 tablet 0  . SODIUM BICARBONATE PO Take by mouth.    . vitamin C (ASCORBIC ACID) 500 MG tablet Take 500 mg by mouth daily.      . WARFARIN SODIUM PO Take by mouth 2 (two)  times a week.     No current facility-administered medications for this visit.    Past Medical History  Diagnosis Date  . HYPERTENSION   . HYPERLIPIDEMIA   . CAD     CABG x 3 in 1997 by Dr. Laneta Simmers; PCI in 2009  . RENAL INSUFFICIENCY   . DM   . Bilateral inguinal hernia (BIH), left scrotal 11/28/2011    Past Surgical History  Procedure Laterality Date  . Appendectomy    . Coronary artery bypass graft    . Cardiac stents placement  2010    ROS:  As stated in the HPI and negative for all other systems.  PHYSICAL EXAM BP 132/76 mmHg  Pulse 100  Ht 5\' 8"  (1.727 m)  Wt 161 lb 4 oz (73.143 kg)  BMI 24.52 kg/m2 GENERAL:  Somewhat frail appearing NECK:  No jugular venous distention, waveform within normal limits, carotid upstroke brisk and symmetric, no bruits, no thyromegaly LYMPHATICS:  No cervical, inguinal adenopathy LUNGS:  Clear to auscultation bilaterally BACK:  No CVA tenderness CHEST:  Well healed sternotomy scar. HEART:  PMI not displaced or sustained,S1 and S2 within normal limits, no S3, no clicks, no rubs, no murmurs, irregular ABD:  Flat, positive bowel sounds normal in frequency in pitch, no bruits, no rebound, no guarding, no midline pulsatile mass, no hepatomegaly, no splenomegaly EXT:  2 plus pulses throughout, no edema, no  cyanosis no clubbing, missing part of the right thumb SKIN:  No rashes no nodules, bruising NEURO:  Cranial nerves II through XII grossly intact, motor grossly intact throughout PSYCH:  Cognitively intact, oriented to person place and time   ASSESSMENT AND PLAN  ATRIAL FIB:  This is a new diagnosis.  [name] has a CHA2DS2 - VASc score of 5 with a risk of stroke of 6.7% . He was initially started on Eliquis but he has reduced renal function and was switched to warfarin. He seems to have increased rate. I did increase his metoprolol to 200 mg twice daily.  CAD:  The patient a negative stress perfusion study in 2014. No further  cardiovascular testing is suggested. We will continue with risk reduction.  [facility]D:  [facility]  HTN:  The blood pressure is at target and being controlled in the context of managing his rate.  FATIGUE:  This is unchanged.  I discussed that this could be related to beta blocker. However, given his upcoming procedures I want to try to make sure he has good rate control and so I'm going to go ahead and increase the beta blocker.  DEPRESSION: I strongly encouraged him to comply with Dr. Mikey BussingHoffman suggestions consider treatment with an antidepressant.

## 2015-07-09 NOTE — Patient Instructions (Signed)
Increase Metoprolol 100 mg take 2 tablets twice a day  New prescription will be 200 mg tablets take 1 tablet twice a day    Your physician wants you to follow-up in: 4 months with Dr Antoine PocheHochrein. You will receive a reminder letter in the mail two months in advance. If you don't receive a letter, please call our office to schedule the follow-up appointment.

## 2015-11-30 NOTE — Progress Notes (Addendum)
HPI The patient presents for followup of his known coronary disease.  He had a stress perfusion study in 2014, which demonstrated no high-risk findings in a well preserved ejection fraction.  He was diagnosed with atrial fibrillation.  He is on warfarin.  He denies any ongoing chest pressure, neck or arm discomfort. His biggest complaint continues to be fatigue. He's had no new shortness of breath though he has chronic dyspnea. He's had no PND or orthopnea.   He still having problems with decreased sleep and depression. His creatinine is getting worse and they say he nearing dialysis though I don't have these records. He seems to be tolerating the medications as listed although his beta blocker doses been reduced apparently because of low blood pressure.  No Known Allergies  Current Outpatient Prescriptions  Medication Sig Dispense Refill  . metoprolol (TOPROL XL) 200 MG 24 hr tablet Take 1 tablet (200 mg total) by mouth daily. 30 tablet 6  . SODIUM BICARBONATE PO Take by mouth.    . cholecalciferol (VITAMIN D) 1000 UNITS tablet Take 1,000 Units by mouth daily.    . isosorbide mononitrate (IMDUR) 120 MG 24 hr tablet Take 120 mg by mouth daily.     . Multiple Vitamins-Minerals (EYE VITAMINS) CAPS Take by mouth.    Marland Kitchen NITROSTAT 0.4 MG SL tablet DISSOLVE ONE TABLET UNDER THE TONGUE EVERY 5 MINUTES AS NEEDED FOR CHEST PAIN.  DO NOT EXCEED A TOTAL OF 3 DOSES IN 15 MINUTES 25 tablet 0  . vitamin C (ASCORBIC ACID) 500 MG tablet Take 500 mg by mouth daily.      . WARFARIN SODIUM PO Take by mouth 2 (two) times a week.     No current facility-administered medications for this visit.    Past Medical History  Diagnosis Date  . HYPERTENSION   . HYPERLIPIDEMIA   . CAD     CABG x 3 in 1997 by Dr. Laneta Simmers; PCI in 2009  . RENAL INSUFFICIENCY   . DM   . Bilateral inguinal hernia (BIH), left scrotal 11/28/2011    Past Surgical History  Procedure Laterality Date  . Appendectomy    . Coronary artery  bypass graft    . Cardiac stents placement  2010    ROS:   Positive for hallucinations and sense of falling. He also has real falls. Otherwise as stated in the HPI and negative for all other systems.  PHYSICAL EXAM BP 118/69 mmHg  Pulse 113  Ht  (1.727 m)  Wt 152 lb 6.4 oz (69.128 kg)  BMI 23.18 kg/m2 GENERAL:  Somewhat frail appearing NECK:  No jugular venous distention, waveform within normal limits, carotid upstroke brisk and symmetric, no bruits, no thyromegaly LUNGS:  Clear to auscultation bilaterally BACK:  No CVA tenderness CHEST:  Well healed sternotomy scar. HEART:  PMI not displaced or sustained,S1 and S2 within normal limits, no S3, no clicks, no rubs, no murmurs, irregular ABD:  Flat, positive bowel sounds normal in frequency in pitch, no bruits, no rebound, no guarding, no midline pulsatile mass, no hepatomegaly, no splenomegaly EXT:  2 plus pulses throughout, no edema, no cyanosis no clubbing, missing part of the right thumb   EKG:  Atrial fibrillation, rate 113, left axis deviation, left anterior fascicular block, right bundle branch block, poor anterior R wave progression, no acute ST-T wave changes.   12/01/2015    ASSESSMENT AND PLAN  ATRIAL FIB:  This is a new diagnosis.  [name] has a  CHA2DS2 - VASc score of 5 with a risk of stroke of 6.7% . He was initially started on Eliquis but he has reduced renal function and was switched to warfarin.   He cannot tolerate increased beta blocker because of his blood pressure. I looked back if his Holter last year he recently averaging 75. I'm not going to make any changes.  CAD:  The patient a negative stress perfusion study in 2014. No further cardiovascular testing is suggested. We will continue with risk reduction.  He is actually not having taken nitroglycerin recently.  CKD:  [facility]. His creatinine is up about 3 per his report.  HTN:  The blood pressure is at target and  being controlled in the context of managing his rate.  He's had had his beta blocker reduced because of blood pressure.  FATIGUE:  This is related to sleep and depression and he will [facility] .

## 2015-12-01 ENCOUNTER — Ambulatory Visit (INDEPENDENT_AMBULATORY_CARE_PROVIDER_SITE_OTHER): Payer: Medicare Other | Admitting: Cardiology

## 2015-12-01 ENCOUNTER — Encounter: Payer: Self-pay | Admitting: Cardiology

## 2015-12-01 VITALS — BP 118/69 | HR 113 | Ht 68.0 in | Wt 152.4 lb

## 2015-12-01 DIAGNOSIS — I4819 Other persistent atrial fibrillation: Secondary | ICD-10-CM

## 2015-12-01 DIAGNOSIS — I481 Persistent atrial fibrillation: Secondary | ICD-10-CM

## 2015-12-01 NOTE — Patient Instructions (Signed)
Medication Instructions:  Continue current medications  Labwork: NONE  Testing/Procedures: NONE  Follow-Up: Your physician wants you to follow-up in: 1 Year. You will receive a reminder letter in the mail two months in advance. If you don't receive a letter, please call our office to schedule the follow-up appointment.   Any Other Special Instructions Will Be Listed Below (If Applicable).   If you need a refill on your cardiac medications before your next appointment, please call your pharmacy.   

## 2016-03-01 ENCOUNTER — Inpatient Hospital Stay (HOSPITAL_COMMUNITY): Payer: Medicare Other

## 2016-03-01 ENCOUNTER — Encounter (HOSPITAL_COMMUNITY): Payer: Self-pay | Admitting: Family Medicine

## 2016-03-01 ENCOUNTER — Inpatient Hospital Stay (HOSPITAL_COMMUNITY)
Admission: EM | Admit: 2016-03-01 | Discharge: 2016-03-07 | DRG: 280 | Disposition: A | Discharge status: Other Acute Inpatient Hospital | Payer: Medicare Other | Source: Other Acute Inpatient Hospital | Attending: Internal Medicine | Admitting: Internal Medicine

## 2016-03-01 DIAGNOSIS — R627 Adult failure to thrive: Secondary | ICD-10-CM | POA: Diagnosis present

## 2016-03-01 DIAGNOSIS — I519 Heart disease, unspecified: Secondary | ICD-10-CM

## 2016-03-01 DIAGNOSIS — E872 Acidosis: Secondary | ICD-10-CM | POA: Diagnosis present

## 2016-03-01 DIAGNOSIS — E785 Hyperlipidemia, unspecified: Secondary | ICD-10-CM | POA: Diagnosis present

## 2016-03-01 DIAGNOSIS — Z961 Presence of intraocular lens: Secondary | ICD-10-CM | POA: Diagnosis present

## 2016-03-01 DIAGNOSIS — F329 Major depressive disorder, single episode, unspecified: Secondary | ICD-10-CM | POA: Diagnosis present

## 2016-03-01 DIAGNOSIS — I252 Old myocardial infarction: Secondary | ICD-10-CM

## 2016-03-01 DIAGNOSIS — I099 Rheumatic heart disease, unspecified: Secondary | ICD-10-CM | POA: Diagnosis not present

## 2016-03-01 DIAGNOSIS — Z7901 Long term (current) use of anticoagulants: Secondary | ICD-10-CM

## 2016-03-01 DIAGNOSIS — I10 Essential (primary) hypertension: Secondary | ICD-10-CM | POA: Diagnosis not present

## 2016-03-01 DIAGNOSIS — I4891 Unspecified atrial fibrillation: Secondary | ICD-10-CM | POA: Diagnosis present

## 2016-03-01 DIAGNOSIS — R9431 Abnormal electrocardiogram [ECG] [EKG]: Secondary | ICD-10-CM | POA: Diagnosis not present

## 2016-03-01 DIAGNOSIS — I2510 Atherosclerotic heart disease of native coronary artery without angina pectoris: Secondary | ICD-10-CM | POA: Diagnosis present

## 2016-03-01 DIAGNOSIS — I132 Hypertensive heart and chronic kidney disease with heart failure and with stage 5 chronic kidney disease, or end stage renal disease: Secondary | ICD-10-CM | POA: Diagnosis present

## 2016-03-01 DIAGNOSIS — J939 Pneumothorax, unspecified: Secondary | ICD-10-CM | POA: Diagnosis not present

## 2016-03-01 DIAGNOSIS — Z7401 Bed confinement status: Secondary | ICD-10-CM

## 2016-03-01 DIAGNOSIS — Z951 Presence of aortocoronary bypass graft: Secondary | ICD-10-CM

## 2016-03-01 DIAGNOSIS — I472 Ventricular tachycardia: Secondary | ICD-10-CM | POA: Diagnosis not present

## 2016-03-01 DIAGNOSIS — E43 Unspecified severe protein-calorie malnutrition: Secondary | ICD-10-CM | POA: Diagnosis present

## 2016-03-01 DIAGNOSIS — R079 Chest pain, unspecified: Secondary | ICD-10-CM | POA: Diagnosis present

## 2016-03-01 DIAGNOSIS — Z9842 Cataract extraction status, left eye: Secondary | ICD-10-CM

## 2016-03-01 DIAGNOSIS — D638 Anemia in other chronic diseases classified elsewhere: Secondary | ICD-10-CM | POA: Diagnosis present

## 2016-03-01 DIAGNOSIS — L8992 Pressure ulcer of unspecified site, stage 2: Secondary | ICD-10-CM | POA: Diagnosis present

## 2016-03-01 DIAGNOSIS — R1310 Dysphagia, unspecified: Secondary | ICD-10-CM | POA: Diagnosis present

## 2016-03-01 DIAGNOSIS — A047 Enterocolitis due to Clostridium difficile: Secondary | ICD-10-CM | POA: Diagnosis present

## 2016-03-01 DIAGNOSIS — N179 Acute kidney failure, unspecified: Secondary | ICD-10-CM | POA: Diagnosis present

## 2016-03-01 DIAGNOSIS — E1122 Type 2 diabetes mellitus with diabetic chronic kidney disease: Secondary | ICD-10-CM | POA: Diagnosis present

## 2016-03-01 DIAGNOSIS — I509 Heart failure, unspecified: Secondary | ICD-10-CM

## 2016-03-01 DIAGNOSIS — Z9841 Cataract extraction status, right eye: Secondary | ICD-10-CM

## 2016-03-01 DIAGNOSIS — L8990 Pressure ulcer of unspecified site, unspecified stage: Secondary | ICD-10-CM | POA: Diagnosis present

## 2016-03-01 DIAGNOSIS — N185 Chronic kidney disease, stage 5: Secondary | ICD-10-CM | POA: Diagnosis present

## 2016-03-01 DIAGNOSIS — A0472 Enterocolitis due to Clostridium difficile, not specified as recurrent: Secondary | ICD-10-CM | POA: Diagnosis present

## 2016-03-01 DIAGNOSIS — I5030 Unspecified diastolic (congestive) heart failure: Secondary | ICD-10-CM | POA: Diagnosis not present

## 2016-03-01 DIAGNOSIS — I5033 Acute on chronic diastolic (congestive) heart failure: Secondary | ICD-10-CM | POA: Diagnosis present

## 2016-03-01 DIAGNOSIS — G92 Toxic encephalopathy: Secondary | ICD-10-CM | POA: Diagnosis present

## 2016-03-01 DIAGNOSIS — I214 Non-ST elevation (NSTEMI) myocardial infarction: Secondary | ICD-10-CM | POA: Diagnosis present

## 2016-03-01 DIAGNOSIS — F05 Delirium due to known physiological condition: Secondary | ICD-10-CM | POA: Diagnosis present

## 2016-03-01 DIAGNOSIS — J90 Pleural effusion, not elsewhere classified: Secondary | ICD-10-CM | POA: Diagnosis present

## 2016-03-01 DIAGNOSIS — D649 Anemia, unspecified: Secondary | ICD-10-CM | POA: Diagnosis present

## 2016-03-01 DIAGNOSIS — J9382 Other air leak: Secondary | ICD-10-CM | POA: Diagnosis not present

## 2016-03-01 DIAGNOSIS — S270XXD Traumatic pneumothorax, subsequent encounter: Secondary | ICD-10-CM | POA: Diagnosis not present

## 2016-03-01 DIAGNOSIS — J9819 Other pulmonary collapse: Secondary | ICD-10-CM | POA: Diagnosis not present

## 2016-03-01 DIAGNOSIS — Z09 Encounter for follow-up examination after completed treatment for conditions other than malignant neoplasm: Secondary | ICD-10-CM

## 2016-03-01 DIAGNOSIS — I5031 Acute diastolic (congestive) heart failure: Secondary | ICD-10-CM | POA: Diagnosis not present

## 2016-03-01 DIAGNOSIS — Z79899 Other long term (current) drug therapy: Secondary | ICD-10-CM

## 2016-03-01 DIAGNOSIS — E46 Unspecified protein-calorie malnutrition: Secondary | ICD-10-CM | POA: Diagnosis present

## 2016-03-01 DIAGNOSIS — R0600 Dyspnea, unspecified: Secondary | ICD-10-CM

## 2016-03-01 DIAGNOSIS — N184 Chronic kidney disease, stage 4 (severe): Secondary | ICD-10-CM | POA: Diagnosis not present

## 2016-03-01 DIAGNOSIS — I479 Paroxysmal tachycardia, unspecified: Secondary | ICD-10-CM | POA: Diagnosis present

## 2016-03-01 DIAGNOSIS — Z6820 Body mass index (BMI) 20.0-20.9, adult: Secondary | ICD-10-CM

## 2016-03-01 DIAGNOSIS — Z515 Encounter for palliative care: Secondary | ICD-10-CM

## 2016-03-01 DIAGNOSIS — E119 Type 2 diabetes mellitus without complications: Secondary | ICD-10-CM

## 2016-03-01 DIAGNOSIS — Z7189 Other specified counseling: Secondary | ICD-10-CM | POA: Diagnosis not present

## 2016-03-01 DIAGNOSIS — Z66 Do not resuscitate: Secondary | ICD-10-CM | POA: Diagnosis present

## 2016-03-01 DIAGNOSIS — Z955 Presence of coronary angioplasty implant and graft: Secondary | ICD-10-CM

## 2016-03-01 HISTORY — DX: Type 2 diabetes mellitus without complications: E11.9

## 2016-03-01 HISTORY — DX: Unspecified protein-calorie malnutrition: E46

## 2016-03-01 HISTORY — DX: Pneumonia, unspecified organism: J18.9

## 2016-03-01 HISTORY — DX: Non-ST elevation (NSTEMI) myocardial infarction: I21.4

## 2016-03-01 HISTORY — DX: Unspecified osteoarthritis, unspecified site: M19.90

## 2016-03-01 HISTORY — DX: Enterocolitis due to Clostridium difficile, not specified as recurrent: A04.72

## 2016-03-01 LAB — BLOOD GAS, ARTERIAL
Acid-base deficit: 0.4 mmol/L (ref 0.0–2.0)
Bicarbonate: 24.1 mmol/L (ref 20.0–28.0)
DRAWN BY: 27022
LHR: 2 {breaths}/min
O2 Saturation: 97 %
PCO2 ART: 41.8 mmHg (ref 32.0–48.0)
PH ART: 7.378 (ref 7.350–7.450)
Patient temperature: 98.6
pO2, Arterial: 86.8 mmHg (ref 83.0–108.0)

## 2016-03-01 LAB — CBC WITH DIFFERENTIAL/PLATELET
BASOS ABS: 0 10*3/uL (ref 0.0–0.1)
BASOS PCT: 0 %
EOS ABS: 0.1 10*3/uL (ref 0.0–0.7)
Eosinophils Relative: 1 %
HEMATOCRIT: 30.8 % — AB (ref 39.0–52.0)
Hemoglobin: 9.6 g/dL — ABNORMAL LOW (ref 13.0–17.0)
Lymphocytes Relative: 21 %
Lymphs Abs: 1.5 10*3/uL (ref 0.7–4.0)
MCH: 29.1 pg (ref 26.0–34.0)
MCHC: 31.2 g/dL (ref 30.0–36.0)
MCV: 93.3 fL (ref 78.0–100.0)
Monocytes Absolute: 0.6 10*3/uL (ref 0.1–1.0)
Monocytes Relative: 8 %
NEUTROS ABS: 4.9 10*3/uL (ref 1.7–7.7)
NEUTROS PCT: 70 %
Platelets: 182 10*3/uL (ref 150–400)
RBC: 3.3 MIL/uL — AB (ref 4.22–5.81)
RDW: 15.9 % — ABNORMAL HIGH (ref 11.5–15.5)
WBC: 7.1 10*3/uL (ref 4.0–10.5)

## 2016-03-01 LAB — IRON AND TIBC
IRON: 60 ug/dL (ref 45–182)
SATURATION RATIOS: 52 % — AB (ref 17.9–39.5)
TIBC: 116 ug/dL — ABNORMAL LOW (ref 250–450)
UIBC: 56 ug/dL

## 2016-03-01 LAB — TROPONIN I
TROPONIN I: 0.15 ng/mL — AB (ref <0.03)
Troponin I: 0.15 ng/mL (ref <0.03)
Troponin I: 0.15 ng/mL (ref <0.03)

## 2016-03-01 LAB — MRSA PCR SCREENING: MRSA by PCR: NEGATIVE

## 2016-03-01 LAB — COMPREHENSIVE METABOLIC PANEL
ALBUMIN: 2.1 g/dL — AB (ref 3.5–5.0)
ALK PHOS: 88 U/L (ref 38–126)
ALT: 18 U/L (ref 17–63)
ANION GAP: 14 (ref 5–15)
AST: 29 U/L (ref 15–41)
BILIRUBIN TOTAL: 0.8 mg/dL (ref 0.3–1.2)
BUN: 52 mg/dL — AB (ref 6–20)
CALCIUM: 7.7 mg/dL — AB (ref 8.9–10.3)
CO2: 23 mmol/L (ref 22–32)
Chloride: 103 mmol/L (ref 101–111)
Creatinine, Ser: 4.2 mg/dL — ABNORMAL HIGH (ref 0.61–1.24)
GFR calc Af Amer: 13 mL/min — ABNORMAL LOW (ref >60)
GFR calc non Af Amer: 12 mL/min — ABNORMAL LOW (ref >60)
GLUCOSE: 116 mg/dL — AB (ref 65–99)
Potassium: 3.5 mmol/L (ref 3.5–5.1)
SODIUM: 140 mmol/L (ref 135–145)
TOTAL PROTEIN: 5.6 g/dL — AB (ref 6.5–8.1)

## 2016-03-01 LAB — HEPARIN LEVEL (UNFRACTIONATED)
HEPARIN UNFRACTIONATED: 0.29 [IU]/mL — AB (ref 0.30–0.70)
HEPARIN UNFRACTIONATED: 0.46 [IU]/mL (ref 0.30–0.70)

## 2016-03-01 LAB — PROTIME-INR
INR: 1.52
Prothrombin Time: 18.4 seconds — ABNORMAL HIGH (ref 11.4–15.2)

## 2016-03-01 LAB — AMMONIA: Ammonia: 23 umol/L (ref 9–35)

## 2016-03-01 LAB — TSH: TSH: 2.928 u[IU]/mL (ref 0.350–4.500)

## 2016-03-01 LAB — FERRITIN: Ferritin: 594 ng/mL — ABNORMAL HIGH (ref 24–336)

## 2016-03-01 MED ORDER — SODIUM BICARBONATE 650 MG PO TABS
325.0000 mg | ORAL_TABLET | Freq: Every day | ORAL | Status: DC
  Administered 2016-03-02 – 2016-03-07 (×6): 325 mg via ORAL
  Filled 2016-03-01 (×6): qty 1

## 2016-03-01 MED ORDER — METOPROLOL SUCCINATE ER 50 MG PO TB24
50.0000 mg | ORAL_TABLET | Freq: Every day | ORAL | Status: DC
  Administered 2016-03-03 – 2016-03-04 (×2): 50 mg via ORAL
  Filled 2016-03-01 (×4): qty 1

## 2016-03-01 MED ORDER — VANCOMYCIN 50 MG/ML ORAL SOLUTION
125.0000 mg | Freq: Two times a day (BID) | ORAL | Status: DC
  Administered 2016-03-06 – 2016-03-07 (×3): 125 mg via ORAL
  Filled 2016-03-01 (×4): qty 2.5

## 2016-03-01 MED ORDER — VANCOMYCIN 50 MG/ML ORAL SOLUTION: 125.0000 mg | Freq: Every day | ORAL | Status: DC

## 2016-03-01 MED ORDER — ACETAMINOPHEN 325 MG PO TABS
650.0000 mg | ORAL_TABLET | ORAL | Status: DC | PRN
  Administered 2016-03-02 (×3): 650 mg via ORAL
  Filled 2016-03-01 (×4): qty 2

## 2016-03-01 MED ORDER — RISAQUAD PO CAPS
1.0000 | ORAL_CAPSULE | Freq: Every day | ORAL | Status: DC
  Administered 2016-03-02 – 2016-03-07 (×6): 1 via ORAL
  Filled 2016-03-01 (×6): qty 1

## 2016-03-01 MED ORDER — VITAMIN D 1000 UNITS PO TABS
1000.0000 [IU] | ORAL_TABLET | Freq: Every day | ORAL | Status: DC
  Administered 2016-03-02 – 2016-03-07 (×6): 1000 [IU] via ORAL
  Filled 2016-03-01 (×6): qty 1

## 2016-03-01 MED ORDER — HEPARIN (PORCINE) IN NACL 100-0.45 UNIT/ML-% IJ SOLN
900.0000 [IU]/h | INTRAMUSCULAR | Status: DC
  Administered 2016-03-01: 800 [IU]/h via INTRAVENOUS
  Administered 2016-03-02: 900 [IU]/h via INTRAVENOUS
  Filled 2016-03-01: qty 250

## 2016-03-01 MED ORDER — METOPROLOL SUCCINATE ER 100 MG PO TB24: 200.0000 mg | ORAL_TABLET | Freq: Every day | ORAL | Status: DC

## 2016-03-01 MED ORDER — DEXTROSE 5 % IV SOLN
120.0000 mg | Freq: Two times a day (BID) | INTRAVENOUS | Status: DC
  Administered 2016-03-01 – 2016-03-07 (×14): 120 mg via INTRAVENOUS
  Filled 2016-03-01 (×17): qty 12

## 2016-03-01 MED ORDER — HEPARIN (PORCINE) IN NACL 100-0.45 UNIT/ML-% IJ SOLN
INTRAMUSCULAR | Status: AC
  Administered 2016-03-01: 800 [IU]/h via INTRAVENOUS
  Filled 2016-03-01: qty 250

## 2016-03-01 MED ORDER — SODIUM CHLORIDE 0.9% FLUSH: 3.0000 mL | INTRAVENOUS | Status: DC | PRN

## 2016-03-01 MED ORDER — VANCOMYCIN 50 MG/ML ORAL SOLUTION: 125.0000 mg | ORAL | Status: DC

## 2016-03-01 MED ORDER — ONDANSETRON HCL 4 MG/2ML IJ SOLN: 4.0000 mg | Freq: Four times a day (QID) | INTRAMUSCULAR | Status: DC | PRN

## 2016-03-01 MED ORDER — VITAMIN C 500 MG PO TABS
500.0000 mg | ORAL_TABLET | Freq: Every day | ORAL | Status: DC
  Administered 2016-03-02 – 2016-03-07 (×6): 500 mg via ORAL
  Filled 2016-03-01 (×6): qty 1

## 2016-03-01 MED ORDER — VANCOMYCIN 50 MG/ML ORAL SOLUTION
125.0000 mg | Freq: Three times a day (TID) | ORAL | Status: AC
  Administered 2016-03-01 – 2016-03-05 (×17): 125 mg via ORAL
  Filled 2016-03-01 (×24): qty 2.5

## 2016-03-01 MED ORDER — VANCOMYCIN 50 MG/ML ORAL SOLUTION
250.0000 mg | Freq: Once | ORAL | Status: DC
  Filled 2016-03-01: qty 5

## 2016-03-01 MED ORDER — ASPIRIN EC 81 MG PO TBEC
81.0000 mg | DELAYED_RELEASE_TABLET | Freq: Every day | ORAL | Status: DC
  Administered 2016-03-02 – 2016-03-07 (×6): 81 mg via ORAL
  Filled 2016-03-01 (×6): qty 1

## 2016-03-01 MED ORDER — SODIUM CHLORIDE 0.9% FLUSH
3.0000 mL | Freq: Two times a day (BID) | INTRAVENOUS | Status: DC
Start: 2016-03-01 — End: 2016-03-07
  Administered 2016-03-01 – 2016-03-07 (×10): 3 mL via INTRAVENOUS

## 2016-03-01 MED ORDER — ISOSORBIDE MONONITRATE ER 60 MG PO TB24: 120.0000 mg | ORAL_TABLET | Freq: Every day | ORAL | Status: DC

## 2016-03-01 NOTE — Progress Notes (Addendum)
Initial Nutrition Assessment  DOCUMENTATION CODES:   Severe malnutrition in context of chronic illness  INTERVENTION:    If TF is deemed appropriate, recommend place Cortrak small bore enteral feeding tube.  Recommend Jevity 1.2 at 20 ml/h, increase by 10 ml every 4 hours to goal rate of 70 ml/h to provide 2016 kcals, 93 gm protein, 1361 ml free water daily.  Recommend monitor magnesium, potassium, and phosphorus daily for at least 3 days, MD to replete as needed, as pt is at risk for refeeding syndrome given recent minimal po intake for > 1 week and severe PCM.  NUTRITION DIAGNOSIS:   Malnutrition related to chronic illness as evidenced by severe depletion of muscle mass, severe fluid accumulation, percent weight loss.  GOAL:   Patient will meet greater than or equal to 90% of their needs  MONITOR:   Diet advancement, Labs, Weight trends, I & O's, TF tolerance, Other (Comment) (overall goals of care)  REASON FOR ASSESSMENT:   Consult Enteral/tube feeding initiation and management  ASSESSMENT:   89 y o with a aPMH of CDA s/p CABG, Afib, CKD IV who presents from OSH ED with AMS and volume overload and NSTEMI. [facility] Course was complicated by acidosis, pleural effusion and toxic encephalopathy.  Spoke with patient's wife who reports that patient has had a poor appetite for the past 2 years. He has had minimal PO intake over the past few weeks at the nursing facility. She thinks he has lost ~20 lbs in the past 6 months. Current fluid overload is masking actual weight loss. 12% weight loss within the past 6 months is significant. She had a lot of questions regarding "IV nutrition." Encouraged wife to discuss nutrition options with physician. Briefly explained the difference in IV nutrition (TPN) vs enteral nutrition (TF). Enteral nutrition would be the preferred route, as TPN would require PICC placement, which increases  risk of infection. Nutrition-Focused physical exam completed. Findings are no fat depletion, severe muscle depletion, and severe edema.  Patient with severe PCM. Currently NPO due to risk of aspiration with decreased mental status.  S/P Palliative Care meeting with family earlier today. Patient's family is not interested in Palliative Care; he remains a full code; family wants to pursue all life maintaining/life prolonging measures, including artificial nutrition and hydration. Received MD Consult for TF initiation and management. There is no enteral access at this time. Recommend place Cortrak tube for enteral nutrition.  Labs reviewed. Medications reviewed and include Vitamin C, Vitamin D, Lasix.  Diet Order:  Diet NPO time specified  Skin:  Wound (see comment) (stage II to buttocks)  Last BM:  9/6 (diarrhea, C. diff)  Height:   Ht Readings from Last 1 Encounters:  03/01/16 5\' 9"  (1.753 m)    Weight:   Wt Readings from Last 1 Encounters:  03/01/16 173 lb 14.4 oz (78.9 kg)    Ideal Body Weight:  72.7 kg  BMI:  Body mass index is 25.68 kg/m.  Estimated Nutritional Needs:   Kcal:  1800-2000  Protein:  90-100 gm  Fluid:  1.8-2 L  EDUCATION NEEDS:   Education needs addressed  Joaquin CourtsKimberly Moody Robben, RD, LDN, CNSC Pager [phone] After Hours Pager [phone]

## 2016-03-01 NOTE — Progress Notes (Signed)
ANTICOAGULATION CONSULT NOTE  Pharmacy Consult for Heparin Indication: chest pain/ACS  Allergies  Allergen Reactions  . Statins Other (See Comments)    [facility]    Patient Measurements: Height: 5\' 9"  (175.3 cm) Weight: 173 lb 14.4 oz (78.9 kg) IBW/kg (Calculated) : 70.7 Heparin Dosing Weight: 75 kg  Vital Signs: Temp: 98.5 F (36.9 C) (09/06 1958) Temp Source: Oral (09/06 1958) BP: 87/57 (09/06 1958) Pulse Rate: 99 (09/06 1958)   Recent Labs  03/01/16 0151 03/01/16 0754 03/01/16 1035 03/01/16 1239 03/01/16 2051  HGB 9.6*  --   --   --   --   HCT 30.8*  --   --   --   --   PLT 182  --   --   --   --   LABPROT 18.4*  --   --   --   --   INR 1.52  --   --   --   --   HEPARINUNFRC  --   --  0.29*  --  0.46  CREATININE 4.20*  --   --   --   --   TROPONINI 0.15* 0.15*  --  0.15*  --     Estimated Creatinine Clearance: 12.6 mL/min (by C-G formula based on SCr of 4.2 mg/dL).   Medical History: Past Medical History:  Diagnosis Date  . Arthritis   . Bilateral inguinal hernia (BIH), left scrotal 11/28/2011  . CAD    CABG x 3 in 1997 by Dr. Laneta SimmersBartle; PCI in 2009  . HYPERLIPIDEMIA   . HYPERTENSION   . Pneumonia 1943  . RENAL INSUFFICIENCY   . Type II diabetes mellitus (HCC)     l      Assessment: 82 y.o. male with NSTEMI on heparin. HL now therapeutic x1 after rate increase. No bleeding reported.   Goal of Therapy:  Heparin level 0.3-0.7 units/ml Monitor platelets by anticoagulation protocol: Yes   Plan:  Continue heparin 900 units/hr Confirmatory HL with AM labs Monitor CBC, s/sx bleeding    Sherle Poeob Taisha Pennebaker, PharmD Clinical Pharmacist 9:48 PM, 03/01/2016

## 2016-03-01 NOTE — Progress Notes (Addendum)
Patient had 5 beats VTACH.  Strip saved to chart by CMT.  Laverda PageLindsay Roberts NP notified.  No new orders received. Will continue to monitor.

## 2016-03-01 NOTE — Progress Notes (Signed)
ANTICOAGULATION CONSULT NOTE - Initial Consult  Pharmacy Consult for Heparin Indication: chest pain/ACS  No Known Allergies  Patient Measurements: Height: 5\' 9"  (175.3 cm) Weight: 171 lb 14.4 oz (78 kg) IBW/kg (Calculated) : 70.7 Heparin Dosing Weight: 75 kg  Vital Signs: Temp: 98.2 F (36.8 C) (09/06 0054) Temp Source: Oral (09/06 0054) BP: 100/63 (09/06 0054) Pulse Rate: 60 (09/06 0054)  Labs (at [facility]): WBC  8.0 Hgb  9.7 Hct  28.9 Plt  155  SCr  3.5  INR  1.4  No results for input(s): HGB, HCT, PLT, APTT, LABPROT, INR, HEPARINUNFRC, HEPRLOWMOCWT, CREATININE, CKTOTAL, CKMB, TROPONINI in the last 72 hours.  CrCl cannot be calculated (Patient's most recent lab result is older than the maximum 21 days allowed.).   Medical History: Past Medical History:  Diagnosis Date  . Bilateral inguinal hernia (BIH), left scrotal 11/28/2011  . CAD    CABG x 3 in 1997 by Dr. Laneta SimmersBartle; PCI in 2009  . DM   . HYPERLIPIDEMIA   . HYPERTENSION   . RENAL INSUFFICIENCY     Medications:  APAP  Align  Lexapro  Lasix  Toprol XL  Remeron  Risperdal  Sodium Bicarbonate  Vancomycin oral      Assessment: 82 y.o. male with NSTEMI for heparin.  Heparin 4650 units IV bolus, 820 units/hr started at 2230.  Stopped on transfer at ~ 1230 am.  Goal of Therapy:  Heparin level 0.3-0.7 units/ml Monitor platelets by anticoagulation protocol: Yes   Plan:  Resume heparin 800 units/hr Check heparin level in 8 hours.   Arianny Pun, Gary FleetGregory Vernon 03/01/2016,1:17 AM

## 2016-03-01 NOTE — Progress Notes (Signed)
ANTICOAGULATION CONSULT NOTE  Pharmacy Consult for Heparin Indication: chest pain/ACS  Allergies  Allergen Reactions  . Statins Other (See Comments)    [facility]    Patient Measurements: Height: 5\' 9"  (175.3 cm) Weight: 173 lb 14.4 oz (78.9 kg) IBW/kg (Calculated) : 70.7 Heparin Dosing Weight: 75 kg  Vital Signs: Temp: 98.2 F (36.8 C) (09/06 0806) Temp Source: Axillary (09/06 0806) BP: 109/69 (09/06 0806) Pulse Rate: 86 (09/06 0450)   Recent Labs  03/01/16 0151 03/01/16 0754 03/01/16 1035  HGB 9.6*  --   --   HCT 30.8*  --   --   PLT 182  --   --   LABPROT 18.4*  --   --   INR 1.52  --   --   HEPARINUNFRC  --   --  0.29*  CREATININE 4.20*  --   --   TROPONINI 0.15* 0.15*  --     Estimated Creatinine Clearance: 12.6 mL/min (by C-G formula based on SCr of 4.2 mg/dL).   Medical History: Past Medical History:  Diagnosis Date  . Bilateral inguinal hernia (BIH), left scrotal 11/28/2011  . CAD    CABG x 3 in 1997 by Dr. Laneta SimmersBartle; PCI in 2009  . DM   . HYPERLIPIDEMIA   . HYPERTENSION   . RENAL INSUFFICIENCY     l      Assessment: 82 y.o. male with NSTEMI on heparin. Noted  That there are no plans for cath with current prognosis and  palliative care has been consulted. -initial heparin level = 0.29  Goal of Therapy:  Heparin level 0.3-0.7 units/ml Monitor platelets by anticoagulation protocol: Yes   Plan:  Increase heparin to 900 units/hr Check heparin level in 8 hours.   Harland GermanAndrew Kuron Docken, Pharm D 03/01/2016 11:23 AM

## 2016-03-01 NOTE — Progress Notes (Addendum)
   ADVANCED HF TEAM NOTE  Patient seen and examined and discussed with sons.  He is 82 y/o male with multiple medical problems including CAD s/p CABG and CKD IV-V.   He has experienced rapid decline over past 6 months with poor appetite and progressive weight loss. Recent course c/b C. Diff colitis and was recently in rehab facility.   Now admitted with marked volume overload and severe protein calorie malnutrition. He has been confused and somnolent during the day but now more awake and able to answer questions though is mildly confused at times.   Echo pending.   Labs notable for Cr 4.2, albumin 2.1, hgb 9.6, nl TSH  On exam  Cachetic with marked temporal wasting SBP 100/60 HR 90s (AF) JVP to jaw Cor IRR Lungs decreased anteriorly Ab soft NT  +Distended Extremities: 3-4+ edema into thighs and scrotum  Like other providers today, I had a frank discussion with his sons and related to them my belief that he is near the end of his life and it is very unlikely that he will live 3-6 months. They again, did not want to accept this and feel that he can be rehabbed.   I told them I felt it was reasonable to diurese him and try to improve his nutrition and his strength but I urged them not to allow him to suffer during this process. I told them that he is not a candidate for advanced HF therapies even if his EF is depressed on echo.   The AHF team will sign off. Would recommend getting [facility] involved to assume his care and help with his multiple general medicine issues as I suspect his edema has much more to do with his low protein state and renal failure than overt HF.   Total time spent 45 minutes. Over half that time spent discussing above.   Ramah Langhans,MD 7:25 PM

## 2016-03-01 NOTE — Progress Notes (Signed)
Subjective:  Unable to get history from patient Long discussion with wife.  Patient has been essentially Bed bound with hardly any PO intake for weeks after bout cdiff colitis. CRF with Cr over 3 in 2014 and now 4.2 History of distant CABG with no active chest pain  No note Of current EF.  Wife seems to have unrealistic expectations About his recovery.    Objective:  Vitals:   03/01/16 0313 03/01/16 0429 03/01/16 0450 03/01/16 0806  BP: 99/81 93/66 101/63 109/69  Pulse: 87 92 86   Resp: 15 10 12 12   Temp:  98.1 F (36.7 C)  98.2 F (36.8 C)  TempSrc:  Oral  Axillary  SpO2: 96% 97% 94% 98%  Weight:  78.9 kg (173 lb 14.4 oz)    Height:        Intake/Output from previous day:  Intake/Output Summary (Last 24 hours) at 03/01/16 0939 Last data filed at 03/01/16 0646  Gross per 24 hour  Intake            85.87 ml  Output              380 ml  Net          -294.13 ml    Physical Exam: Moribound white male Not lucid Some UE tremor activity BS positive soft Condom catheter on Plus 3 edema with scrotal edema S1/S2 no murmur Basilar rales   Lab Results: Basic Metabolic Panel:  Recent Labs  40/98/11 0151  NA 140  K 3.5  CL 103  CO2 23  GLUCOSE 116*  BUN 52*  CREATININE 4.20*  CALCIUM 7.7*   Liver Function Tests:  Recent Labs  03/01/16 0151  AST 29  ALT 18  ALKPHOS 88  BILITOT 0.8  PROT 5.6*  ALBUMIN 2.1*   CBC:  Recent Labs  03/01/16 0151  WBC 7.1  NEUTROABS 4.9  HGB 9.6*  HCT 30.8*  MCV 93.3  PLT 182   Cardiac Enzymes:  Recent Labs  03/01/16 0151 03/01/16 0754  TROPONINI 0.15* 0.15*    Thyroid Function Tests:  Recent Labs  03/01/16 0151  TSH 2.928    Imaging: No results found.  Cardiac Studies:  ECG: ST first degree RBBB LAFB    Telemetry:  afib rate 84    Echo: pending   Medications:   . acidophilus  1 capsule Oral Daily  . aspirin EC  81 mg Oral Daily  . cholecalciferol  1,000 Units Oral Daily  . furosemide   120 mg Intravenous BID  . metoprolol  50 mg Oral Daily  . sodium bicarbonate  325 mg Oral Daily  . sodium chloride flush  3 mL Intravenous Q12H  . vancomycin  125 mg Oral TID AC & HS   Followed by  . [START ON 03/06/2016] vancomycin  125 mg Oral BID   Followed by  . [START ON 03/11/2016] vancomycin  125 mg Oral Daily   Followed by  . [START ON 03/16/2016] vancomycin  125 mg Oral Q48H  . vancomycin  250 mg Oral Once  . vitamin C  500 mg Oral Daily     . heparin 800 Units/hr (03/01/16 0136)    Assessment/Plan:   CHF:  Patient is clearly a palliative care patient. Wife with unrealistic expectations Will Start with echo to assess EF and keep on high dose bid lasix. Will check ammonia and  ABG.  Not clear what can be done for nutrition will ask for consult as  albumin is 2.1 and he Has not had adequate PO intake in weeks. Palliative care, CHF and renal to see.    Critical care time spent with patient and discussing issue with wife 60 minutes  Charlton Hawseter Melayah Skorupski 03/01/2016, 9:39 AM

## 2016-03-01 NOTE — Progress Notes (Signed)
Patient had 9 beats vtach at 1826.  CMT to save to epic.  Patient being changed at this time.  Patient asymptomatic.

## 2016-03-01 NOTE — Consult Note (Signed)
Reason for Consult:  AKI/CKD Referring Physician: Clarnce FlockFudim, MD  [name] KeelsRichard [name] is an 82 y.o. male.  HPI: [name] [name] is an elderly WM with multiple medical problems most notable for CAD s/p CABG, HTN, CKD stage 5 not yet on HD and followed by [facility]. Nestor [facility]owskyJawa [facility] with baseline Scr of 3.8-5, and recent hospitalization at [facility] last week for C diff complicated by deconditioning and [place] who was [facility] with AMS and volume overload.  Apparently he had missed a dose of lasix which could explain the volume but not his altered mental state.  He was last seen by Nephrology on 01/19/16 and his Scr was over 5 at the time and lasix was being held due to AKI/CKD.  He is unable to give any history as he is unresponsive and the history is obtained by interview with his wife and son as well as electronic medical review.  We were asked to help further evaluate and manage his AKI/CKD.  He has had a significant decline in functional status over the last month and due to his advanced age and multiple comorbidities Palliative Care was consulted as well to help set goals/limits of care.  He is not a suitable dialysis candidate, so their assistance is much appreciated.  His Scr has fluctuated from 2.8 to 5.8 since April 2017 and peaked on 11/10/15 at 5.8 but has been 3.5-3.8 during his hospitalization in August/Sept 2017.    Trend in Creatinine: Creatinine, Ser  Date/Time Value Ref Range Status  03/01/2016 01:51 AM 4.20 (H) 0.61 - 1.24 mg/dL Final  16/10/960408/27/2014 54:0908:27 AM 3.1 (H) 0.4 - 1.5 mg/dL Final  81/19/147812/17/2009 29:5611:11 AM 2.4 (H) 0.4 - 1.5 mg/dL Final  21/30/865711/28/2009 84:6906:09 AM 2.16 (H)  Final  05/22/2008 03:35 AM 2.30 DELTA CHECK NOTED (H)  Final  05/21/2008 06:55 PM 0.86  Final  05/16/2008 03:30 AM 2.20 (H)  Final  05/15/2008 04:20 AM 2.35 (H)  Final  05/14/2008 02:55 AM 2.27 (H)  Final    PMH:   Past Medical History:  Diagnosis Date  . Bilateral inguinal  hernia (BIH), left scrotal 11/28/2011  . CAD    CABG x 3 in 1997 by Dr. Laneta [name]; PCI in 2009  . DM   . HYPERLIPIDEMIA   . HYPERTENSION   . RENAL INSUFFICIENCY     PSH:   Past Surgical History:  Procedure Laterality Date  . APPENDECTOMY    . cardiac stents placement  2010  . CORONARY ARTERY BYPASS GRAFT      Allergies:  Allergies  Allergen Reactions  . Statins Other (See Comments)    [facility]    Medications:   Prior to Admission medications   Medication Sig Start Date End Date Taking? Authorizing Provider  acetaminophen (TYLENOL) 650 MG CR tablet Take 650 mg by mouth every 4 (four) hours as needed for pain.   Yes Historical Provider, MD  escitalopram (LEXAPRO) 10 MG tablet Take 10 mg by mouth daily.  11/18/15  Yes Historical Provider, MD  furosemide (LASIX) 40 MG tablet Take 40 mg by mouth daily as needed for fluid.  10/20/15 10/19/16 Yes Historical Provider, MD  metoprolol succinate (TOPROL-XL) 50 MG 24 hr tablet Take 50 mg by mouth daily. Take with or immediately following a meal.   Yes Historical Provider, MD  mirtazapine (REMERON) 15 MG tablet Take 15 mg by mouth at bedtime.   Yes Historical Provider,  MD  Multiple Vitamins-Minerals (EYE VITAMINS) CAPS Take 1 capsule by mouth daily.    Yes Historical Provider, MD  multivitamin (RENA-VIT) TABS tablet Take 1 tablet by mouth daily.   Yes Historical Provider, MD  Probiotic Product (ALIGN) 4 MG CAPS Take 4 mg by mouth daily.   Yes Historical Provider, MD  risperiDONE (RISPERDAL) 0.25 MG tablet Take 0.25 mg by mouth at bedtime.   Yes Historical Provider, MD  sodium bicarbonate 650 MG tablet Take 1,300 mg by mouth 2 (two) times daily.   Yes Historical Provider, MD  vancomycin (VANCOCIN) 250 MG capsule Take 500 mg by mouth every 6 (six) hours. 02/26/16  Yes Historical Provider, MD  NITROSTAT 0.4 MG SL tablet DISSOLVE ONE TABLET UNDER THE TONGUE EVERY 5 MINUTES AS NEEDED FOR CHEST PAIN.  DO NOT EXCEED A TOTAL OF 3 DOSES IN  15 MINUTES Patient taking differently: Place 0.4 mg under the tongue every 5 (five) minutes as needed for chest pain (Do not exceed a total of 3 doses in 15 minutes).  11/14/13   [name] Rotunda, MD    Inpatient medications: . acidophilus  1 capsule Oral Daily  . aspirin EC  81 mg Oral Daily  . cholecalciferol  1,000 Units Oral Daily  . furosemide  120 mg Intravenous BID  . metoprolol  50 mg Oral Daily  . sodium bicarbonate  325 mg Oral Daily  . sodium chloride flush  3 mL Intravenous Q12H  . vancomycin  125 mg Oral TID AC & HS   Followed by  . [START ON 03/06/2016] vancomycin  125 mg Oral BID   Followed by  . [START ON 03/11/2016] vancomycin  125 mg Oral Daily   Followed by  . [START ON 03/16/2016] vancomycin  125 mg Oral Q48H  . vancomycin  250 mg Oral Once  . vitamin C  500 mg Oral Daily    Discontinued Meds:   Medications Discontinued During This Encounter  Medication Reason  . isosorbide mononitrate (IMDUR) 24 hr tablet 120 mg   . metoprolol succinate (TOPROL-XL) 24 hr tablet 200 mg   . Probiotic Product (PROBIOTIC DAILY PO) Duplicate  . metoprolol (TOPROL XL) 200 MG 24 hr tablet Dose change  . Probiotic Product (PROBIOTIC DAILY PO) Duplicate  . SODIUM BICARBONATE PO Dose change  . cholecalciferol (VITAMIN D) 1000 UNITS tablet Discontinued by provider  . isosorbide mononitrate (IMDUR) 120 MG 24 hr tablet Discontinued by provider  . RA KRILL OIL 500 MG CAPS Discontinued by provider  . vitamin C (ASCORBIC ACID) 500 MG tablet Discontinued by provider  . warfarin (COUMADIN) 5 MG tablet Discontinued by provider  . vancomycin (VANCOCIN) 125 MG capsule Prescription never filled    Social History:  reports that he has never smoked. He has never used smokeless tobacco. He reports that he does not drink alcohol or use drugs.  Family History:  No family history on file.  Review of systems not obtained due to patient factors. Weight change:   Intake/Output Summary (Last 24 hours)  at 03/01/16 1155 Last data filed at 03/01/16 0646  Gross per 24 hour  Intake            85.87 ml  Output              380 ml  Net          -294.13 ml   BP 109/69 (BP Location: Right Arm)   Pulse 86   Temp 97.4 F (36.3 C) (Axillary)  Resp 12   Ht 5\' 9"  (1.753 m)   Wt 78.9 kg (173 lb 14.4 oz)   SpO2 97%   BMI 25.68 kg/m  Vitals:   03/01/16 0429 03/01/16 0450 03/01/16 0806 03/01/16 1130  BP: 93/66 101/63 109/69   Pulse: 92 86    Resp: 10 12 12    Temp: 98.1 F (36.7 C)  98.2 F (36.8 C) 97.4 F (36.3 C)  TempSrc: Oral  Axillary Axillary  SpO2: 97% 94% 98% 97%  Weight: 78.9 kg (173 lb 14.4 oz)     Height:         General appearance: Frail, elderly, chronically ill-appearing white male who is unresponsive Head: Normocephalic, without obvious abnormality, atraumatic Resp: diminished breath sounds bibasilar and rhonchi bilaterally Cardio: no rub and tachycardic at 103 GI: soft, non-tender; bowel sounds normal; no masses,  no organomegaly Extremities: edema 2+ of lower extremities/feet Neurologic: Mental status: alertness: obtunded, nonverbal  Labs: Basic Metabolic Panel:  Recent Labs Lab 03/01/16 0151  NA 140  K 3.5  CL 103  CO2 23  GLUCOSE 116*  BUN 52*  CREATININE 4.20*  ALBUMIN 2.1*  CALCIUM 7.7*   Liver Function Tests:  Recent Labs Lab 03/01/16 0151  AST 29  ALT 18  ALKPHOS 88  BILITOT 0.8  PROT 5.6*  ALBUMIN 2.1*   No results for input(s): LIPASE, AMYLASE in the last 168 hours.  Recent Labs Lab 03/01/16 1035  AMMONIA 23   CBC:  Recent Labs Lab 03/01/16 0151  WBC 7.1  NEUTROABS 4.9  HGB 9.6*  HCT 30.8*  MCV 93.3  PLT 182   PT/INR: @LABRCNTIP (inr:5) Cardiac Enzymes: ) Recent Labs Lab 03/01/16 0151 03/01/16 0754  TROPONINI 0.15* 0.15*   CBG: No results for input(s): GLUCAP in the last 168 hours.  Iron Studies: No results for input(s): IRON, TIBC, TRANSFERRIN, FERRITIN in the last 168 hours.  Xrays/Other Studies: No  results found.   Assessment/Plan: 1.  AKI/CKD- not much off his baseline.  Pt is a poor candidate for HD and agree with palliative care consult, although family does not appear to be realistic of his outcome/prognosis.  He does have Nephrology follow up with [facility] and a transfer back to [place] for second opinion may be beneficial to family.   2. AMS- unclear etiology, doubt uremia as his Scr has been much worse in the recent past.  W/u per primary svc 3. NSTEMI- per cardiology 4. CHF- agree with IV Lasix but will need to continue to follow Scr as he is not HD candidate. 5. Anemia of chronic disease- will check iron studies and consider ESA 6. C diff- on po vanco 7. Deconditioning- was in SNF and according to family was about to participate with PT when he became ill. 8. Severe protein malnutrition. 9. Disposition- overall prognosis is poor.  Agree with palliative care consult.    [name] [name] 03/01/2016, 11:55 AM

## 2016-03-01 NOTE — Consult Note (Signed)
Consultation Note Date: 03/01/2016   Patient Name: [name] [name]  DOB: 30-Mar-1928  MRN: [id number]  Age / Sex: 82 y.o., male  PCP: Raelene Bott, MD Referring Physician: Cristina Gong, MD  Reason for Consultation: Establishing goals of care  HPI/Patient Profile: 82 y.o. male   admitted on 03/01/2016    Clinical Assessment and Goals of Care:  82 year old gentleman with a past medical history significant for stage IV chronic kidney disease, hypertension, dyslipidemia, diabetes mellitus, coronary artery disease status post CABG 3 in 1997, stents in 2010, history of bilateral inguinal hernia. Patient has had subacute-chronic decline essentially since 2015 with issues pertaining to malnutrition and worsening chronic kidney disease. More recently, patient was hospitalized at [facility] from August 2-09/06/2015 for Clostridium difficile infection, metabolic acidosis, pleural effusion, encephalopathy. The patient was deemed to have improved enough to be transitioned to short-term rehabilitation from [facility] on 02-25-16. It is reported that the patient seemed to have been participating in physical therapy there and was compliant with his medications there however developed confusion and was admitted overnight to [facility] on 03-01-16. Patient has been admitted with working diagnosis of diastolic congestive heart failure exacerbation, possible non-ST segment elevation MI, atrial fibrillation, altered mental status and fluid overload.  A palliative care consultation has been requested for goals of care discussions.  Patient is an elderly gentleman resting in bed. His wife and is present at the bedside. Patient is encephalopathic. He does not open eyes to voice command. He does not follow directions. He does appear to have some mild nonverbal gestures of distress/discomfort with moaning/grimacing at  times. Briefly discussed with patient's wife and at the bedside who requested that we come back when the patient's son Elta Guadeloupe arrives.  Family meeting:  The undersigned met with the patient's son Elta Guadeloupe and patient's wife and in the patient's room along with palliative care nurse practitioner Ihor Dow. We introduced ourselves and palliative care as follows: Palliative medicine is specialized medical care for people living with serious illness. It focuses on providing relief from the symptoms and stress of a serious illness. The goal is to improve quality of life for both the patient and the family. Patient's son Elta Guadeloupe openly stated that there goals were not palliative. He stated, "no offense, goals are not palliative". Offered active listening and supportive care. Offered reassurance that we were simply they're to understand the patient's health story and to help align the patient's goals and wishes to current medical plan of care.  Patient's son Elta Guadeloupe primary historian then went on to elaborate that the patient had about of shingles and subsequently developed Clostridium difficile infection. Weeks-months ago. He recalled that the patient had seen a nephrologist in the outpatient setting and had declined dialysis. He has been having issues pertaining to depression and malnutrition essentially since November 2015. Elta Guadeloupe is the middle son. The patient has 3 children. 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 They state that they're not sure whether or not the patient received oral Lasix at [facility]. They believe that the patient has declined rather acutely since the last 4-5 days and that they believe this directly to be a result of the patient not being on oral Lasix.   discussed about the patient's underlying  chronic conditions of chronic kidney disease, congestive heart failure. Discussed about last known surface echocardiogram in the patient's chart from care everywhere from April 2017 showing preserved left ventricular systolic function ejection fraction 50-55 percent but also showing mild left ventricular hypertrophy, degenerative mitral valve. Discussed that repeat echocardiogram has been ordered today. Discussed about declined nutritional status, declining functional status, depleted immune reserves since the patient's recent bout of Clostridium difficile infection, prolonged hospitalization for nearly 4 weeks at [facility].  Patient's son had several questions for [place] pertaining to patient's WBC count, patient's BUN/creatinine serum creatinine levels, whether or not the patient was being continued on oral vancomycin and oral probiotic, making request for artificial nutrition and hydration to be initiated. Extensive discussions about end stage chronic irreversible nature of patient's multiple conditions discussed in detail. Concept of multisystem organ failure, terminal delirium/encephalopathy also discussed in detail. All questions answered.  Family clearly states that there goals are to concentrate on fluid removal, stabilization of patient's medical conditions, transition to rehabilitation and then their ultimate goal is to bring him home. They state repeatedly that they're not interested in palliative perspective. Offered reassurance and active listening. Hoped to establish trust relationship and alignment goals.  I did offer medical recommendations from a palliative perspective to consider DO NOT RESUSCITATE/DO NOT INTUBATE, to consider the patient already having entered into the dying process, to consider the possibility of the patient likely having a prognosis of few days at this point. See recommendations below. Thank you for the consult.  HCPOA  son Gerasimos Plotts along with the patient's wife  of 17 years Zygmunt Mcglinn.   SUMMARY OF RECOMMENDATIONS    Full code Any and all life maintaining/life prolonging measures are deemed appropriate.  Dietician consult for artificial nutrition and hydration.  Family wishes to discuss with cardiologist about ECHO, CXR and blood work results.   Code Status/Advance Care Planning:  Full code    Symptom Management:    continue current treatment measures for now.   Palliative Prophylaxis:   Aspiration  Additional Recommendations (Limitations, Scope, Preferences):  Full Scope Treatment  Psycho-social/Spiritual:   Desire for further Chaplaincy support:no  Additional Recommendations: Caregiving  Support/Resources  Prognosis:   Unable to determine, guarded. Frankly discussed with son and wife about high likelihood of the patient dying despite aggressive medical measures within the next few days.   Discharge Planning: To Be Determined      Primary Diagnoses: Present on Admission: . Acute diastolic (congestive) heart failure ([place])   I have reviewed the medical record, interviewed the patient and family, and examined the patient. The following aspects are pertinent.  Past Medical History:  Diagnosis Date  . Bilateral inguinal hernia (BIH), left scrotal 11/28/2011  . CAD    CABG x 3 in 1997 by Dr. Cyndia Bent; PCI in 2009  . DM   . HYPERLIPIDEMIA   . HYPERTENSION   . RENAL INSUFFICIENCY    Social History   Social History  . Marital status: Married    Spouse name: N/A  . Number of children: N/A  .  Years of education: N/A   Social History Main Topics  . Smoking status: Never Smoker  . Smokeless tobacco: Never Used  . Alcohol use No  . Drug use: No  . Sexual activity: Not Currently   Other Topics Concern  . Not on file   Social History Narrative  . No narrative on file   No family history on file. Scheduled Meds: . acidophilus  1 capsule Oral Daily  . aspirin EC  81 mg Oral Daily  . cholecalciferol  1,000 Units  Oral Daily  . furosemide  120 mg Intravenous BID  . metoprolol  50 mg Oral Daily  . sodium bicarbonate  325 mg Oral Daily  . sodium chloride flush  3 mL Intravenous Q12H  . vancomycin  125 mg Oral TID AC & HS   Followed by  . [START ON 03/06/2016] vancomycin  125 mg Oral BID   Followed by  . [START ON 03/11/2016] vancomycin  125 mg Oral Daily   Followed by  . [START ON 03/16/2016] vancomycin  125 mg Oral Q48H  . vancomycin  250 mg Oral Once  . vitamin C  500 mg Oral Daily   Continuous Infusions: . heparin 800 Units/hr (03/01/16 0136)   PRN Meds:.acetaminophen, ondansetron (ZOFRAN) IV, sodium chloride flush Medications Prior to Admission:  Prior to Admission medications   Medication Sig Start Date End Date Taking? Authorizing Provider  acetaminophen (TYLENOL) 650 MG CR tablet Take 650 mg by mouth every 4 (four) hours as needed for pain.   Yes Historical Provider, MD  escitalopram (LEXAPRO) 10 MG tablet Take 10 mg by mouth daily.  11/18/15  Yes Historical Provider, MD  furosemide (LASIX) 40 MG tablet Take 40 mg by mouth daily as needed for fluid.  10/20/15 10/19/16 Yes Historical Provider, MD  metoprolol succinate (TOPROL-XL) 50 MG 24 hr tablet Take 50 mg by mouth daily. Take with or immediately following a meal.   Yes Historical Provider, MD  mirtazapine (REMERON) 15 MG tablet Take 15 mg by mouth at bedtime.   Yes Historical Provider, MD  Multiple Vitamins-Minerals (EYE VITAMINS) CAPS Take 1 capsule by mouth daily.    Yes Historical Provider, MD  multivitamin (RENA-VIT) TABS tablet Take 1 tablet by mouth daily.   Yes Historical Provider, MD  Probiotic Product (ALIGN) 4 MG CAPS Take 4 mg by mouth daily.   Yes Historical Provider, MD  risperiDONE (RISPERDAL) 0.25 MG tablet Take 0.25 mg by mouth at bedtime.   Yes Historical Provider, MD  sodium bicarbonate 650 MG tablet Take 1,300 mg by mouth 2 (two) times daily.   Yes Historical Provider, MD  vancomycin (VANCOCIN) 250 MG capsule Take 500 mg by  mouth every 6 (six) hours. 02/26/16  Yes Historical Provider, MD  NITROSTAT 0.4 MG SL tablet DISSOLVE ONE TABLET UNDER THE TONGUE EVERY 5 MINUTES AS NEEDED FOR CHEST PAIN.  DO NOT EXCEED A TOTAL OF 3 DOSES IN 15 MINUTES Patient taking differently: Place 0.4 mg under the tongue every 5 (five) minutes as needed for chest pain (Do not exceed a total of 3 doses in 15 minutes).  11/14/13   Minus Breeding, MD   Allergies  Allergen Reactions  . Statins Other (See Comments)    Per MAR at [facility] + for generalized distress, patient non verbal  Physical Exam Does not open eyes, does not follow commands 2-3+ bilateral LE edema, pale, warm to touch.  Irregular heart beat Diminished breath sounds  anteriorly Abdomen not distended, bowel sounds Wakes to painful stimuli  Vital Signs: BP 109/69 (BP Location: Right Arm)   Pulse 86   Temp 97.4 F (36.3 C) (Axillary)   Resp 12   Ht _0  (1.753 m)   Wt 78.9 kg (173 lb 14.4 oz)   SpO2 97%   BMI 25.68 kg/m  Pain Assessment: No/denies pain       SpO2: SpO2: 97 % O2 Device:SpO2: 97 % O2 Flow Rate: .O2 Flow Rate (L/min): 2 L/min  IO: Intake/output summary:  Intake/Output Summary (Last 24 hours) at 03/01/16 1133 Last data filed at 03/01/16 0646  Gross per 24 hour  Intake            85.87 ml  Output              380 ml  Net          -294.13 ml    LBM: Last BM Date: 03/01/16 Baseline Weight: Weight: 78 kg (171 lb 14.4 oz) Most recent weight: Weight: 78.9 kg (173 lb 14.4 oz)     Palliative Assessment/Data:   Flowsheet Rows   Flowsheet Row Most Recent Value  Intake Tab  Referral Department  Cardiology  Unit at Time of Referral  Cardiac/Telemetry Unit  Palliative Care Primary Diagnosis  Cardiac  Date Notified  03/01/16  Palliative Care Type  New Palliative care  Reason for referral  Clarify Goals of Care  Date of Admission  03/01/16  Date first seen by Palliative Care  03/01/16  # of days Palliative  referral response time  0 Day(s)  # of days IP prior to Palliative referral  0  Clinical Assessment  Palliative Performance Scale Score  10%  Pain Max last 24 hours  4  Pain Min Last 24 hours  3  Dyspnea Max Last 24 Hours  3  Dyspnea Min Last 24 hours  2  Nausea Max Last 24 Hours  3  Nausea Min Last 24 Hours  2  Anxiety Max Last 24 Hours  3  Psychosocial & Spiritual Assessment  Palliative Care Outcomes  Patient/Family meeting held?  Yes  Who was at the meeting?  wife son  Palliative Care Outcomes  Clarified goals of care  Palliative Care follow-up planned  Yes, Facility      Time In:  9 am Time Out:  11 am  Time Total:  120 min  Greater than 50%  of this time was spent counseling and coordinating care related to the above assessment and plan.  Signed by: Loistine Chance, MD  [phone] Please contact Palliative Medicine Team phone at [phone] for questions and concerns.  For individual provider: See Shea Evans

## 2016-03-01 NOTE — H&P (Signed)
History & Physical    Patient ID: [name] [name] MRN: [id number], DOB/AGE: [birth year]   Admit date: 03/01/2016   Primary Physician: Lindwood Qua, MD Primary Cardiologist: Hochrein  Patient Profile    89 y o man with HF exacerbation and NSTEMI  Past Medical History    Past Medical History:  Diagnosis Date  . Bilateral inguinal hernia (BIH), left scrotal 11/28/2011  . CAD    CABG x 3 in 1997 by Dr. Laneta Simmers; PCI in 2009  . DM   . HYPERLIPIDEMIA   . HYPERTENSION   . RENAL INSUFFICIENCY     Past Surgical History:  Procedure Laterality Date  . APPENDECTOMY    . cardiac stents placement  2010  . CORONARY ARTERY BYPASS GRAFT       Allergies  No Known Allergies  History of Present Illness    [name] [name] is 89 y o with a aPMH of CDA s/p CABG, Afib, CKD IV who presents from OSH ED with AMS and volume overload and NSTEMI. He has been admitted to [facility] for 1 m in August for [facility] colitis. Course was complicated by acidosis, pleural effusion and toxic encephalopathy. He left for rehab on a vancomycin titer less than one week ago. At rehab the wife noted that the patient was "off" yesterday. She also noted him to be puffy the first time few days ago. Apparently he wasn't given his lasix at the rehab facility up until a few days ago at the wife's request.  She noted him to be SOB as well. No PND or orthopnea. His confusion remained unchanged.   At the ED he was noted ot be grossly fluid up, confused (thus history needed to be obtained from his wife), HIs BNP <20.000 and his trop was 0.1. ECG without acute changes but in Afib with RVR with soft BPs (100 syst). He was given 60mg  iv lasix total and transferred to [facility].    Home Medications    Prior to Admission medications   Medication Sig Start Date End Date Taking? Authorizing Provider  cholecalciferol (VITAMIN D) 1000 UNITS tablet Take 1,000 Units by mouth daily.    Historical Provider, MD  escitalopram (LEXAPRO) 10 MG tablet   11/18/15   Historical Provider, MD  furosemide (LASIX) 40 MG tablet Take 40 mg by mouth. 10/20/15 10/19/16  Historical Provider, MD  isosorbide mononitrate (IMDUR) 120 MG 24 hr tablet Take 120 mg by mouth daily.  06/07/11   Historical Provider, MD  metoprolol (TOPROL XL) 200 MG 24 hr tablet Take 1 tablet (200 mg total) by mouth daily. 07/09/15   [name], MD  Multiple Vitamins-Minerals (EYE VITAMINS) CAPS Take by mouth.    Historical Provider, MD  NITROSTAT 0.4 MG SL tablet DISSOLVE ONE TABLET UNDER THE TONGUE EVERY 5 MINUTES AS NEEDED FOR CHEST PAIN.  DO NOT EXCEED A TOTAL OF 3 DOSES IN 15 MINUTES 11/14/13   [name], MD  Probiotic Product (PROBIOTIC DAILY PO) Take by mouth.    Historical Provider, MD  Probiotic Product (PROBIOTIC DAILY PO) Take by mouth.    Historical Provider, MD  RA KRILL OIL 500 MG CAPS Take by mouth.    Historical Provider, MD  SODIUM BICARBONATE PO Take by mouth.    Historical Provider, MD  vitamin C (ASCORBIC ACID) 500 MG tablet Take 500 mg by mouth daily.      Historical Provider, MD  warfarin (COUMADIN) 5 MG tablet  09/24/15   Historical Provider, MD    Family History  No family history on file.  Social History    Social History   Social History  . Marital status: Married    Spouse name: N/A  . Number of children: N/A  . Years of education: N/A   Occupational History  . Not on file.   Social History Main Topics  . Smoking status: Never Smoker  . Smokeless tobacco: Never Used  . Alcohol use No  . Drug use: No  . Sexual activity: Not Currently   Other Topics Concern  . Not on file   Social History Narrative  . No narrative on file     Review of Systems    General:  No chills, fever, night sweats or weight changes.  Cardiovascular:  No chest pain, dyspnea on exertion, edema, orthopnea, palpitations, paroxysmal nocturnal dyspnea. Dermatological: No rash, lesions/masses Respiratory: No cough, dyspnea Urologic: No hematuria,  dysuria Abdominal:   No nausea, vomiting, diarrhea, bright red blood per rectum, melena, or hematemesis Neurologic:  No visual changes, wkns, changes in mental status. All other systems reviewed and are otherwise negative except as noted above.  Physical Exam    There were no vitals taken for this visit.  General: alert but difficult to understand, follows commands Psych: follows commands Neuro: Moves all extremities spontaneously. HEENT: JVP at 16  Neck: Supple  Lungs:  Resp regular and unlabored, crackles Heart: irregular and tachy Abdomen: Soft, non-tender, non-distended, BS + x 4.  Extremities: anasarca  Labs    Troponin (Point of Care Test) No results for input(s): TROPIPOC in the last 72 hours. No results for input(s): CKTOTAL, CKMB, TROPONINI in the last 72 hours. Lab Results  Component Value Date   WBC 8.1 02/19/2013   HGB 11.4 (L) 02/19/2013   HCT 33.9 (L) 02/19/2013   MCV 91.2 02/19/2013   PLT 183.0 02/19/2013   No results for input(s): NA, K, CL, CO2, BUN, CREATININE, CALCIUM, PROT, BILITOT, ALKPHOS, ALT, AST, GLUCOSE in the last 168 hours.  Invalid input(s): LABALBU Lab Results  Component Value Date   CHOL 116 02/19/2013   HDL 24.80 (L) 02/19/2013   LDLCALC 58 02/19/2013   TRIG 165.0 (H) 02/19/2013   No results found for: [facility]   Radiology Studies    No results found.  ECG & Cardiac Imaging   pending  Assessment & Plan    [name] [name] has CAD, CKD and high degree of deconditioning. He lost a lot of ground during his preceding 1 month of hospitalization and is currenty suffering from what appears to be a mix of acute on chronic diastolic HF and protein malnutrition. His NSTEMI is likely a type 2 event in the setting of HF only propagated by his Afib with RVR. His mental status is impaired given all the reasons above. HIs functional status at baseline is poor per wife but has been dramatically worse  Since the recent hospitalization. HIs overall prognosis  is poor. Not a candidate for LHC any time soon. His wife wants him full code. I asked for a family meeting to be arranged tomorrow and she will call her kids.   Plan: - Admission labs incl Trop and CBC and CMP - Lasix 120mg  BID - Restart home metop - Heparin ggt for afib and NSTEMI - Palliative consult placed  Signed, Macario GoldsMarat Sirron Francesconi, MD 03/01/2016, 12:52 AM

## 2016-03-01 NOTE — Progress Notes (Signed)
The client has been unable to take po medications tonight due to AMS. Once IV access was established, assessment complete, IV medication started, and a bed change with condom cath placement the client became irritable, agitated, and combative. I notified Fudim with Cardiology that po medication was not given.   Notified Cardiology of Tropin's 0.15 which was a [facility] which [facility] 0.176. I will continue to monitor the client closely.   Sheppard Evens. Kahliyah Dick RN

## 2016-03-01 NOTE — Progress Notes (Signed)
SLP Cancellation Note  Patient Details Name: [name] KeelsRichard [name] MRN: [id number] DOB: 15-May-1928   Cancelled treatment:       Reason Eval/Treat Not Completed: Patient adamantly refused to participate. Would not allow oral care or accept any PO. Spouse present and tearful. Will follow.   Rocky CraftsKara E Arley Garant MA, CCC-SLP Pager [phone] 03/01/2016, 2:53 PM

## 2016-03-02 ENCOUNTER — Inpatient Hospital Stay (HOSPITAL_COMMUNITY): Payer: Medicare Other

## 2016-03-02 ENCOUNTER — Encounter (HOSPITAL_COMMUNITY): Payer: Self-pay | Admitting: Internal Medicine

## 2016-03-02 DIAGNOSIS — I214 Non-ST elevation (NSTEMI) myocardial infarction: Secondary | ICD-10-CM | POA: Diagnosis present

## 2016-03-02 DIAGNOSIS — A0472 Enterocolitis due to Clostridium difficile, not specified as recurrent: Secondary | ICD-10-CM | POA: Diagnosis present

## 2016-03-02 DIAGNOSIS — L8990 Pressure ulcer of unspecified site, unspecified stage: Secondary | ICD-10-CM | POA: Diagnosis present

## 2016-03-02 DIAGNOSIS — E46 Unspecified protein-calorie malnutrition: Secondary | ICD-10-CM | POA: Diagnosis present

## 2016-03-02 DIAGNOSIS — D649 Anemia, unspecified: Secondary | ICD-10-CM | POA: Diagnosis present

## 2016-03-02 DIAGNOSIS — I099 Rheumatic heart disease, unspecified: Secondary | ICD-10-CM

## 2016-03-02 DIAGNOSIS — E119 Type 2 diabetes mellitus without complications: Secondary | ICD-10-CM

## 2016-03-02 DIAGNOSIS — I479 Paroxysmal tachycardia, unspecified: Secondary | ICD-10-CM | POA: Diagnosis present

## 2016-03-02 DIAGNOSIS — R9431 Abnormal electrocardiogram [ECG] [EKG]: Secondary | ICD-10-CM

## 2016-03-02 LAB — CBC
HCT: 26.8 % — ABNORMAL LOW (ref 39.0–52.0)
Hemoglobin: 8.3 g/dL — ABNORMAL LOW (ref 13.0–17.0)
MCH: 29 pg (ref 26.0–34.0)
MCHC: 31 g/dL (ref 30.0–36.0)
MCV: 93.7 fL (ref 78.0–100.0)
PLATELETS: 162 10*3/uL (ref 150–400)
RBC: 2.86 MIL/uL — ABNORMAL LOW (ref 4.22–5.81)
RDW: 16.1 % — AB (ref 11.5–15.5)
WBC: 5.9 10*3/uL (ref 4.0–10.5)

## 2016-03-02 LAB — BLOOD GAS, ARTERIAL
Acid-Base Excess: 0.8 mmol/L (ref 0.0–2.0)
Bicarbonate: 24.9 mmol/L (ref 20.0–28.0)
Drawn by: 460981
O2 Content: 2 L/min
O2 Saturation: 94.6 %
Patient temperature: 98.6
pCO2 arterial: 40.3 mmHg (ref 32.0–48.0)
pH, Arterial: 7.408 (ref 7.350–7.450)
pO2, Arterial: 70.2 mmHg — ABNORMAL LOW (ref 83.0–108.0)

## 2016-03-02 LAB — RENAL FUNCTION PANEL
Albumin: 1.8 g/dL — ABNORMAL LOW (ref 3.5–5.0)
Anion gap: 9 (ref 5–15)
BUN: 50 mg/dL — AB (ref 6–20)
CHLORIDE: 108 mmol/L (ref 101–111)
CO2: 24 mmol/L (ref 22–32)
CREATININE: 3.92 mg/dL — AB (ref 0.61–1.24)
Calcium: 7.7 mg/dL — ABNORMAL LOW (ref 8.9–10.3)
GFR calc non Af Amer: 13 mL/min — ABNORMAL LOW (ref >60)
GFR, EST AFRICAN AMERICAN: 15 mL/min — AB (ref >60)
Glucose, Bld: 72 mg/dL (ref 65–99)
POTASSIUM: 3.2 mmol/L — AB (ref 3.5–5.1)
Phosphorus: 4.8 mg/dL — ABNORMAL HIGH (ref 2.5–4.6)
Sodium: 141 mmol/L (ref 135–145)

## 2016-03-02 LAB — HEPARIN LEVEL (UNFRACTIONATED): Heparin Unfractionated: 0.37 IU/mL (ref 0.30–0.70)

## 2016-03-02 LAB — ECHOCARDIOGRAM COMPLETE
HEIGHTINCHES: 69 in
WEIGHTICAEL: 2704 [oz_av]

## 2016-03-02 LAB — VITAMIN B12: VITAMIN B 12: 1009 pg/mL — AB (ref 180–914)

## 2016-03-02 LAB — FOLATE: Folate: 15.6 ng/mL (ref >5.9)

## 2016-03-02 LAB — MAGNESIUM: Magnesium: 1.8 mg/dL (ref 1.7–2.4)

## 2016-03-02 MED ORDER — POTASSIUM CHLORIDE 20 MEQ/15ML (10%) PO SOLN
40.0000 meq | Freq: Once | ORAL | Status: AC
  Administered 2016-03-02: 40 meq via ORAL
  Filled 2016-03-02: qty 30

## 2016-03-02 MED ORDER — ORAL CARE MOUTH RINSE
15.0000 mL | Freq: Two times a day (BID) | OROMUCOSAL | Status: DC
  Administered 2016-03-02 – 2016-03-07 (×11): 15 mL via OROMUCOSAL

## 2016-03-02 MED ORDER — LIDOCAINE HCL (PF) 1 % IJ SOLN
INTRAMUSCULAR | Status: AC
  Filled 2016-03-02: qty 10

## 2016-03-02 MED ORDER — ENOXAPARIN SODIUM 30 MG/0.3ML ~~LOC~~ SOLN
30.0000 mg | SUBCUTANEOUS | Status: DC
  Administered 2016-03-02: 30 mg via SUBCUTANEOUS
  Filled 2016-03-02: qty 0.3

## 2016-03-02 NOTE — Progress Notes (Signed)
Since about 0230 patient has been sleeping.  Patient did stir but did not awaken when RN pulled the pillow out that was wedging patient at 0430.  Two RN's into reposition patient and provide incontinent care as needed about this time.  Patient did have a bowel movement and two RN's assisted patient with turning side to side so care could be provided.  Prior to starting to turn patient RN woke patient with touch to arm and calling patient's name and explained that RN was going to help patient turn.  Patient said ok and then appeared to go back to sleep. Care completed.  Patient due for scheduled Vancomycin.  Once RN done speaking to patient patient appears to fall back asleep.  Scheduled Vancomycin not administered due to concern for patient safety pertaining to patient swallowing at this time.

## 2016-03-02 NOTE — Progress Notes (Signed)
ANTICOAGULATION CONSULT NOTE - Follow Up Consult  Pharmacy Consult for Heparin Indication: chest pain/ACS  Allergies  Allergen Reactions  . Statins Other (See Comments)    [facility]    Patient Measurements: Height: 5\' 9"  (175.3 cm) Weight: 169 lb (76.7 kg) IBW/kg (Calculated) : 70.7 Heparin Dosing Weight:  75 kg  Vital Signs: Temp: 97.5 F (36.4 C) (09/07 0828) Temp Source: Axillary (09/07 0828) BP: 92/72 (09/07 0828) Pulse Rate: 90 (09/07 0828)  Labs:  Recent Labs  03/01/16 0151 03/01/16 0754 03/01/16 1035 03/01/16 1239 03/01/16 2051 03/02/16 0342  HGB 9.6*  --   --   --   --  8.3*  HCT 30.8*  --   --   --   --  26.8*  PLT 182  --   --   --   --  162  LABPROT 18.4*  --   --   --   --   --   INR 1.52  --   --   --   --   --   HEPARINUNFRC  --   --  0.29*  --  0.46 0.37  CREATININE 4.20*  --   --   --   --  3.92*  TROPONINI 0.15* 0.15*  --  0.15*  --   --     Estimated Creatinine Clearance: 13.5 mL/min (by C-G formula based on SCr of 3.92 mg/dL).  Assessment:  Anticoagulation:  Heparin for NSTEMI/Afib On Coumadin in past (as on 11/2015) for Afib. Admit INR was 1.52.  -HL= 0.46, 0.37 in goal CBC: Hgb 9.6>8.3 overnight. Plts 182>162.  Goal of Therapy:  Heparin level 0.3-0.7 units/ml Monitor platelets by anticoagulation protocol: Yes   Plan:  -IV heparin 900 units/hr -extremely poor prognosis.  -f/u cards plans - See MD sticky note about resuming home meds.   [name] [name], PharmD, BCPS Clinical Staff Pharmacist Pager [phone]  [name] [name], [name] [name] 03/02/2016,8:45 AM

## 2016-03-02 NOTE — Progress Notes (Signed)
Patient ID: [name] [name], male   DOB: [birth year], 82 y.o.   MRN: [id number] S:More awake today but still disoriented O:BP 92/72 (BP Location: Right Arm)   Pulse 90   Temp 97.5 F (36.4 C) (Axillary)   Resp 10   Ht 5\' 9"  (1.753 m)   Wt 76.7 kg (169 lb)   SpO2 95%   BMI 24.96 kg/m   Intake/Output Summary (Last 24 hours) at 03/02/16 0836 Last data filed at 03/02/16 0700  Gross per 24 hour  Intake              543 ml  Output              500 ml  Net               43 ml   Intake/Output: I/O last 3 completed shifts: In: 628.9 [P.O.:435; I.V.:131.9; IV Piggyback:62] Out: 880 [Urine:880]  Intake/Output this shift:  No intake/output data recorded. Weight change: -1.315 kg (-2 lb 14.4 oz) WUJ:WJXBJGen:frail, chronically ill-appearing WM CVS: no rub Resp:scattered rhonchi YNW:GNFAOZAbd:benign Ext:2+ pitting edema   Recent Labs Lab 03/01/16 0151 03/02/16 0342  NA 140 141  K 3.5 3.2*  CL 103 108  CO2 23 24  GLUCOSE 116* 72  BUN 52* 50*  CREATININE 4.20* 3.92*  ALBUMIN 2.1* 1.8*  CALCIUM 7.7* 7.7*  PHOS  --  4.8*  AST 29  --   ALT 18  --    Liver Function Tests:  Recent Labs Lab 03/01/16 0151 03/02/16 0342  AST 29  --   ALT 18  --   ALKPHOS 88  --   BILITOT 0.8  --   PROT 5.6*  --   ALBUMIN 2.1* 1.8*   No results for input(s): LIPASE, AMYLASE in the last 168 hours.  Recent Labs Lab 03/01/16 1035  AMMONIA 23   CBC:  Recent Labs Lab 03/01/16 0151 03/02/16 0342  WBC 7.1 5.9  NEUTROABS 4.9  --   HGB 9.6* 8.3*  HCT 30.8* 26.8*  MCV 93.3 93.7  PLT 182 162   Cardiac Enzymes:  Recent Labs Lab 03/01/16 0151 03/01/16 0754 03/01/16 1239  TROPONINI 0.15* 0.15* 0.15*   CBG: No results for input(s): GLUCAP in the last 168 hours.  Iron Studies:  Recent Labs  03/01/16 1730  IRON 60  TIBC 116*  FERRITIN 594*   Studies/Results: Dg Chest Port 1 View  Result Date: 03/01/2016 CLINICAL DATA:  Short of breath EXAM: PORTABLE CHEST 1 VIEW COMPARISON:  02/29/2016  FINDINGS: Two sternotomy wires overlies normal cardiac silhouette. Bilateral pleural effusions greater on the LEFT. No pneumothorax. Central venous congestion. IMPRESSION: 1. No significant change. 2. Central venous congestion and bilateral pleural effusion. Moderate to large effusion on the LEFT. Electronically Signed   By: Genevive BiStewart  Edmunds M.D.   On: 03/01/2016 12:07   . acidophilus  1 capsule Oral Daily  . aspirin EC  81 mg Oral Daily  . cholecalciferol  1,000 Units Oral Daily  . furosemide  120 mg Intravenous BID  . metoprolol  50 mg Oral Daily  . sodium bicarbonate  325 mg Oral Daily  . sodium chloride flush  3 mL Intravenous Q12H  . vancomycin  125 mg Oral TID AC & HS   Followed by  . [START ON 03/06/2016] vancomycin  125 mg Oral BID   Followed by  . [START ON 03/11/2016] vancomycin  125 mg Oral Daily   Followed by  . [START ON 03/16/2016] vancomycin  125  mg Oral Q48H  . vancomycin  250 mg Oral Once  . vitamin C  500 mg Oral Daily    BMET    Component Value Date/Time   NA 141 03/02/2016 0342   K 3.2 (L) 03/02/2016 0342   CL 108 03/02/2016 0342   CO2 24 03/02/2016 0342   GLUCOSE 72 03/02/2016 0342   BUN 50 (H) 03/02/2016 0342   CREATININE 3.92 (H) 03/02/2016 0342   CALCIUM 7.7 (L) 03/02/2016 0342   CALCIUM 8.2 (L) 05/16/2008 0330   GFRNONAA 13 (L) 03/02/2016 0342   GFRAA 15 (L) 03/02/2016 0342   CBC    Component Value Date/Time   WBC 5.9 03/02/2016 0342   RBC 2.86 (L) 03/02/2016 0342   HGB 8.3 (L) 03/02/2016 0342   HCT 26.8 (L) 03/02/2016 0342   PLT 162 03/02/2016 0342   MCV 93.7 03/02/2016 0342   MCH 29.0 03/02/2016 0342   MCHC 31.0 03/02/2016 0342   RDW 16.1 (H) 03/02/2016 0342   LYMPHSABS 1.5 03/01/2016 0151   MONOABS 0.6 03/01/2016 0151   EOSABS 0.1 03/01/2016 0151   BASOSABS 0.0 03/01/2016 0151    Assessment/Plan: 1.  AKI/CKD- not much off his baseline.  Pt is a poor candidate for HD and agree with palliative care consult, although family does not appear  to be realistic of his outcome/prognosis.  He does have Nephrology follow up with [facility] and a transfer back to [place] for second opinion may be beneficial to family.   1. Scr improved somewhat but not a significant change in overall GFR. 2. AMS- unclear etiology, doubt uremia as his Scr has been much worse in the recent past.  W/u per primary svc 3. NSTEMI- per cardiology 1. Awaiting ECHO results 4. CHF- agree with IV Lasix but will need to continue to follow Scr as he is not HD candidate. 5. Anemia of chronic disease- will check iron studies and consider ESA 6. C diff- on po vanco 7. Deconditioning- was in SNF and according to family was about to participate with PT when he became ill. 8. Severe protein malnutrition. 9. Disposition- overall prognosis is poor.  Agree with palliative care consult.   Julien Nordmann Brien Lowe

## 2016-03-02 NOTE — Progress Notes (Signed)
  Echocardiogram 2D Echocardiogram has been performed.  [name] [name], [name] [name] 03/02/2016, 1:24 PM

## 2016-03-02 NOTE — Procedures (Addendum)
Successful US guided left thoracentesis. Yielded 1.7 liters of clear light orange fluid. Pt tolerated procedure well. No immediate complications.  CXR revealed PTX. Patient remains stable. Discussed with Dr. Deanne CofferHassell No need for chest tube at this time Will repeat CXR in 2-3 hours to make sure it remains stable.  WENDY S BLAIR PA-C 03/02/2016 4:10 PM

## 2016-03-02 NOTE — Progress Notes (Signed)
Patient had a 26 beat run of Vtach, patient sleeping. Cardiology paged and on call was updated.  Order received.

## 2016-03-02 NOTE — Progress Notes (Signed)
PROGRESS NOTE    [name] [name]  ZOX:[id number] DOB: 08-31-1927 DOA: 03/01/2016 PCP: Lindwood Qua, MD  Outpatient Specialists:   Brief Narrative: 82 year old gentleman with a past medical history significant for stage IV chronic kidney disease, hypertension, dyslipidemia, diabetes mellitus, coronary artery disease status post CABG 3 in 1997, stents in 2010, history of bilateral inguinal hernia. Patient hospitalized at [facility] from August 2-09/06/2015 for Clostridium difficile infection, metabolic acidosis, pleural effusion, encephalopathy. The patient was deemed to have improved enough to be transitioned to short-term rehabilitation from [facility] on 02-25-16. reportedly patient participated in physical therapy there and was compliant with his medications there however developed confusion and was admitted  to [facility] on 03-01-16 by cardiology working diagnosis of diastolic congestive heart failure exacerbation, possible non-ST segment elevation MI, atrial fibrillation, altered mental status and fluid overload, acute on chronic kidney disease. He has been evaluated by HF team, nephrology, palliative care. [facility] is being transferred to [facility] for management of multiple general medical issues    Assessment & Plan:   Principal Problem:   Acute diastolic (congestive) heart failure (HCC) Active Problems:   Essential hypertension   Encounter for palliative care   Goals of care, counseling/discussion   Chronic kidney disease (CKD), stage IV (severe) (HCC)   Acute confusional state   Acute heart disease   Pressure ulcer   NSTEMI (non-ST elevated myocardial infarction) (HCC)   Type II diabetes mellitus (HCC)   Malnutrition (HCC)   C. difficile colitis   Anemia  #1. Acute diastolic heart failure in the setting of NSTEMI, afib with RVR and protein malnutrition. Awaiting echo. History CAD, A. Fib. Improved today. Initially he was on a Lasix drip. weight  trending down. Creatinine improving. Evaluated by the heart failure team who opined he is at end-of-life and not candidate for advanced therapies Likely he'll live 3-6 months. Did recommend diuresing and trying to improve his nutrition and strength as well as palliative care consult -continue lasix drip -follow echo -continue BB -daily weight -intake and output -daily bmet to trend kidney function  #2.NSTEMI. Cardiology H&P notes indicate Stemi likely type 2 event in setting of HF and afib with RVR. EKG with afib and HB. Troponin stable.  Patient denies pain. Patient started on a heparin drip on admission. Chart review indicates intermittent episodes of 10-20 beat runs of Vtach.  -Defer to cardiology -monitor  #3. Acute on chronic kidney disease stage III to 4. Creatinine 3.92 trending down from 4.2 on admission. This appears to be closer to his baseline. Evaluated by nephrology who opined current function not far from his baseline per his note. Note also indicates he is a poor candidate for hemodialysis and recommend palliative care consult -Continue Lasix drip -Hold any other nephrotoxins -Monitor urine output  #4. Acute confusional state. Likely related to all above. Improved today. Evaluated by speech therapy who opined patient more cooperative and more alert -Monitor closely -will check B12 and folate and rpr -if fails to continue to improve consider imaging -Advance diet per speech therapy recommendation  #5. Anemia. Likely of chronic disease. Garman 8.3 today down from 9.6 yesterday. No signs of obvious bleeding. -fobt -anemia panel -iron studies per nephrology -monitor -consider transfusion when indicated  #4. C. difficile. Patient in the hospital for the entire month of August for same. Was discharged to rehabilitation on oral bank. Ears stable. -Continue by mouth Vanco -precautions  #5. Severe protein malnutrition/ deconditioning. Patient was in a SNF prior to  this  admission. Chart review indicates patient has had slow decline since 2015 with issues pertaining to mount nutrition and worsening chronic kidney disease. However was reported that he participated in physical therapy at his recent SNF Speech therapy evaluation today. Of note heart failure team opines edema more likely related to low protein versus #1. Albumin 2.1 of note Palliative Care consulted and note indicates family with realistic expectations. -Nutritional consult -Physical therapy consult -Advance diet per recommendations of speech therapy  6. Hypertension. History of same. Blood pressure quite soft but improving during this hospitalization. Medications include metoprolol Lasix. -Continue Lasix drip as noted above -Beta blocker per cardiology -Monitor  #7. A. fib with RVR. [place] score 5. On admission patient started on heparin drip and metoprolol. EKG today with sinus tachycardia with 1st degree AV block and Right BBB and left anterior fasicular block.  Heart rate 101 -Continue heparin drip  -continue beta blocker -defer to cardiology   DVT prophylaxis: on heparin Code Status: full. Several providers and Palliative care have discussed poor prognosis with family. Family wish for patient to remain full code Family Communication: none present Consultants:   Nephrology coladonato  Cardiology nishan  Procedures:  Awaiting echo  Antimicrobials: oral vancomycin per pharmacy  Subjective:  Denies pain discomfort shortness of breath    Objective: Vitals:   03/01/16 2250 03/02/16 0050 03/02/16 0430 03/02/16 0828  BP: 95/67 101/79 (!) 98/58 92/72  Pulse: 94 86 85 90  Resp: 12 14 10    Temp:   98.7 F (37.1 C) 97.5 F (36.4 C)  TempSrc:    Axillary  SpO2: 97% 96% 95%   Weight:   76.7 kg (169 lb)   Height:        Intake/Output Summary (Last 24 hours) at 03/02/16 1029 Last data filed at 03/02/16 0700  Gross per 24 hour  Intake              543 ml  Output              500  ml  Net               43 ml   Filed Weights   03/01/16 0054 03/01/16 0429 03/02/16 0430  Weight: 78 kg (171 lb 14.4 oz) 78.9 kg (173 lb 14.4 oz) 76.7 kg (169 lb)    Examination:  General exam: Appears In frail chronically ill gray in color Respiratory system: No increased work of breathing. Respirations somewhat shallow breath sounds distant fine crackles in bases Cardiovascular system: irregularly regular. + JVD, no murmurs, rubs, gallops or clicks. 2-3 + LE edema up to thighs Gastrointestinal system: Abdomen is nondistended, soft and nontender. No organomegaly or masses felt. Normal bowel sounds heard. Central nervous system: Quite lethargic but arouses to verbal stimuli. Answers questions appropriately. Follows commands Extremities: Bilateral upper extremity strength 3 out of 5 Skin:  Psychiatry: Judgement and insight appear normal. Mood & affect appropriate.     Data Reviewed: I have personally reviewed following labs and imaging studies  CBC:  Recent Labs Lab 03/01/16 0151 03/02/16 0342  WBC 7.1 5.9  NEUTROABS 4.9  --   HGB 9.6* 8.3*  HCT 30.8* 26.8*  MCV 93.3 93.7  PLT 182 162   Basic Metabolic Panel:  Recent Labs Lab 03/01/16 0151 03/02/16 0342  NA 140 141  K 3.5 3.2*  CL 103 108  CO2 23 24  GLUCOSE 116* 72  BUN 52* 50*  CREATININE 4.20* 3.92*  CALCIUM 7.7*  7.7*  MG  --  1.8  PHOS  --  4.8*   GFR: Estimated Creatinine Clearance: 13.5 mL/min (by C-G formula based on SCr of 3.92 mg/dL). Liver Function Tests:  Recent Labs Lab 03/01/16 0151 03/02/16 0342  AST 29  --   ALT 18  --   ALKPHOS 88  --   BILITOT 0.8  --   PROT 5.6*  --   ALBUMIN 2.1* 1.8*   No results for input(s): LIPASE, AMYLASE in the last 168 hours.  Recent Labs Lab 03/01/16 1035  AMMONIA 23   Coagulation Profile:  Recent Labs Lab 03/01/16 0151  INR 1.52   Cardiac Enzymes:  Recent Labs Lab 03/01/16 0151 03/01/16 0754 03/01/16 1239  TROPONINI 0.15* 0.15* 0.15*    BNP (last 3 results) No results for input(s): PROBNP in the last 8760 hours. HbA1C: No results for input(s): HGBA1C in the last 72 hours. CBG: No results for input(s): GLUCAP in the last 168 hours. Lipid Profile: No results for input(s): CHOL, HDL, LDLCALC, TRIG, CHOLHDL, LDLDIRECT in the last 72 hours. Thyroid Function Tests:  Recent Labs  03/01/16 0151  TSH 2.928   Anemia Panel:  Recent Labs  03/01/16 1730  FERRITIN 594*  TIBC 116*  IRON 60   Urine analysis: No results found for: COLORURINE, APPEARANCEUR, LABSPEC, PHURINE, GLUCOSEU, HGBUR, BILIRUBINUR, KETONESUR, PROTEINUR, UROBILINOGEN, NITRITE, LEUKOCYTESUR Sepsis Labs: @LABRCNTIP (procalcitonin:4,lacticidven:4)  ) Recent Results (from the past 240 hour(s))  MRSA PCR Screening     Status: None   Collection Time: 03/01/16  1:00 AM  Result Value Ref Range Status   MRSA by PCR NEGATIVE NEGATIVE Final    Comment:        The GeneXpert MRSA Assay (FDA approved for NASAL specimens only), is one component of a comprehensive MRSA colonization surveillance program. It is not intended to diagnose MRSA infection nor to guide or monitor treatment for MRSA infections.          Radiology Studies: Dg Chest Port 1 View  Result Date: 03/01/2016 CLINICAL DATA:  Short of breath EXAM: PORTABLE CHEST 1 VIEW COMPARISON:  02/29/2016 FINDINGS: Two sternotomy wires overlies normal cardiac silhouette. Bilateral pleural effusions greater on the LEFT. No pneumothorax. Central venous congestion. IMPRESSION: 1. No significant change. 2. Central venous congestion and bilateral pleural effusion. Moderate to large effusion on the LEFT. Electronically Signed   By: Genevive Bi M.D.   On: 03/01/2016 12:07        Scheduled Meds: . acidophilus  1 capsule Oral Daily  . aspirin EC  81 mg Oral Daily  . cholecalciferol  1,000 Units Oral Daily  . furosemide  120 mg Intravenous BID  . mouth rinse  15 mL Mouth Rinse BID  . metoprolol   50 mg Oral Daily  . potassium chloride  40 mEq Oral Once  . sodium bicarbonate  325 mg Oral Daily  . sodium chloride flush  3 mL Intravenous Q12H  . vancomycin  125 mg Oral TID AC & HS   Followed by  . [START ON 03/06/2016] vancomycin  125 mg Oral BID   Followed by  . [START ON 03/11/2016] vancomycin  125 mg Oral Daily   Followed by  . [START ON 03/16/2016] vancomycin  125 mg Oral Q48H  . vancomycin  250 mg Oral Once  . vitamin C  500 mg Oral Daily   Continuous Infusions: . heparin 900 Units/hr (03/02/16 0209)     LOS: 1 day    Time spent: 90  minutes    Gwenyth Bender, MD [facility]   If 7PM-7AM, please contact night-coverage [email or web address] Password [email or web address] 03/02/2016, 10:29 AM

## 2016-03-02 NOTE — Progress Notes (Signed)
RN and nurse tech entered room to reposition patient and provide incontinent care if needed.  Patient drowsy during beginning of care.  Once care finished RN completed head to toe assessment.  Patient participated in RN assessment, following simple commands and answering questions.  RN asked patient if he would like to try some jello and patient replied yes.  Patient sitting in bed with head of bed elevated ninety degrees and RN gave patient a bite of jello.  No signs or symptoms of aspiration noted.  Patient ate about half the container of jello and then inquired about regular food.  RN explained to patient that he would have to wait until tomorrow when his diet order was re-evaluated.  RN reminded patient that speech therapy had come to visit patient earlier today to possibly clear him for solid foods but patient would not participate in evaluation.  During this interaction patient's son was present at bedside.

## 2016-03-02 NOTE — Progress Notes (Signed)
Patient awake and alert.  Patient is alert to self, [place]  RN asked what type of place patient was at and patient replied [place].  RN asked again where patient was [place], [place]  [place] but what type of place are we at.  Patient replied I already told you.  During RN assessment patient frequently will answer RN question with a question of his own making it difficult to truly assess orientation.  However patient carrying on conversation with staff at this time.  The more RN and nurse tech converse with patient, the things patient will say and remark on don't always make sense with the flow of the conversation.

## 2016-03-02 NOTE — Evaluation (Signed)
Clinical/Bedside Swallow Evaluation Patient Details  Name: [name] [name] MRN: [id number] Date of Birth: 03-27-1928  Today's Date: 03/02/2016 Time: SLP Start Time (ACUTE ONLY): 0855 SLP Stop Time (ACUTE ONLY): 0916 SLP Time Calculation (min) (ACUTE ONLY): 21 min  Past Medical History:  Past Medical History:  Diagnosis Date  . Arthritis   . Bilateral inguinal hernia (BIH), left scrotal 11/28/2011  . CAD    CABG x 3 in 1997 by Dr. Laneta SimmersBartle; PCI in 2009  . HYPERLIPIDEMIA   . HYPERTENSION   . Pneumonia 1943  . RENAL INSUFFICIENCY   . Type II diabetes mellitus (HCC)    Past Surgical History:  Past Surgical History:  Procedure Laterality Date  . APPENDECTOMY    . CATARACT EXTRACTION W/ INTRAOCULAR LENS  IMPLANT, BILATERAL Bilateral   . CORONARY ANGIOPLASTY WITH STENT PLACEMENT  2010  . CORONARY ARTERY BYPASS GRAFT  ~ 1999   "triple"  . SCROTAL SURGERY Left   . TONSILLECTOMY     HPI:   82 year old gentleman with a past medical history significant for stage IV chronic kidney disease, hypertension, dyslipidemia, diabetes mellitus, coronary artery disease status post CABG 3 in 1997, stents in 2010, history of bilateral inguinal hernia. Patient has had subacute-chronic decline essentially since 2015 with issues pertaining to malnutrition and worsening chronic kidney disease. More recently, patient was hospitalized at [facility] from August 2-09/06/2015 for Clostridium difficile infection, metabolic acidosis, pleural effusion, encephalopathy. The patient was deemed to have improved enough to be transitioned to short-term rehabilitation from [facility] on 02-25-16. It is reported that the patient seemed to have been participating in physical therapy there and was compliant with his medications there however developed confusion and was [facility] on 03[facility]01[facility]16. Patient has been admitted with working diagnosis of diastolic congestive heart failure  exacerbation, possible non-ST segment elevation MI, atrial fibrillation, altered mental status and fluid overload.   Assessment / Plan / Recommendation Clinical Impression  Pt referred for clinical swallow assessment. Pt was much brighter and more agreeable to participate this date. He tolerated all PO trials presented [facility] and verbalized interest in eating. No family present during the session. Recommend: Dys 3 with thin liquids. Meds whole with puree. Given pt's variability, deconditioning and AMS, risk for aspiration is present, however recommend proceeding with initiation of advanced PO diet with careful feeding as pt willing. Will follow.    Aspiration Risk  Moderate aspiration risk    Diet Recommendation Dysphagia 3 (Mech soft);Thin liquid   Liquid Administration via: Cup;Straw Medication Administration: Whole meds with puree Supervision: Full supervision/cueing for compensatory strategies;Staff to assist with self feeding Compensations: Minimize environmental distractions;Slow rate;Small sips/bites;Follow solids with liquid Postural Changes: Seated upright at 90 degrees    Other  Recommendations Oral Care Recommendations: Oral care BID   Follow up Recommendations  24 hour supervision/assistance;Skilled Nursing facility    Frequency and Duration min 2x/week  1 week       Prognosis Prognosis for Safe Diet Advancement: Fair Barriers to Reach Goals: Cognitive deficits;Behavior;Motivation      Swallow Study   General Date of Onset: 03/01/16 HPI:  82 year old gentleman with a past medical history significant for stage IV chronic kidney disease, hypertension, dyslipidemia, diabetes mellitus, coronary artery disease status post CABG 3 in 1997, stents in 2010, history of bilateral inguinal hernia. Patient has had subacute-chronic decline essentially since 2015 with issues pertaining to malnutrition and worsening chronic kidney disease. More recently, patient was [facility] from August 2-09/06/2015 for Clostridium difficile infection, metabolic acidosis, pleural effusion, encephalopathy. The patient was deemed to have improved enough to be transitioned to short-term rehabilitation from [facility] on 02-25-16. It is reported that the patient seemed to have been participating in physical therapy there and was compliant with his medications there however developed confusion and was admitted overnight to [facility] on 03-01-16. Patient has been admitted with working diagnosis of diastolic congestive heart failure exacerbation, possible non-ST segment elevation MI, atrial fibrillation, altered mental status and fluid overload. Type of Study: Bedside Swallow Evaluation Previous Swallow Assessment: none known Diet Prior to this Study:  (clear liquids) Temperature Spikes Noted: No Respiratory Status: Nasal cannula (2 L) History of Recent Intubation: No Behavior/Cognition: Alert;Cooperative Oral Cavity Assessment: Within Functional Limits Oral Care Completed by SLP:  (pt refused) Oral Cavity - Dentition: Missing dentition Self-Feeding Abilities: Needs assist Patient Positioning: Upright in bed Baseline Vocal Quality: Normal Volitional Swallow: Unable to elicit    Oral/Motor/Sensory Function Overall Oral Motor/Sensory Function: Within functional limits   Ice Chips Ice chips: Not tested   Thin Liquid Thin Liquid: Within functional limits Presentation: Straw    Nectar Thick     Honey Thick     Puree Puree: Within functional limits Presentation: Spoon   Solid   GO   Solid: Within functional limits        Rocky Crafts MA, CCC-SLP Pager [phone] 03/02/2016,9:29 AM

## 2016-03-02 NOTE — Progress Notes (Signed)
Subjective:  Still with waxing and waning MS Minimal PO intake Discussed multiple issues with wife  Willing to have transfusion, thoracentesis, feeding tube  Objective:  Vitals:   03/01/16 2250 03/02/16 0050 03/02/16 0430 03/02/16 0828  BP: 95/67 101/79 (!) 98/58 92/72  Pulse: 94 86 85 90  Resp: 12 14 10    Temp:   98.7 F (37.1 C) 97.5 F (36.4 C)  TempSrc:    Axillary  SpO2: 97% 96% 95%   Weight:   76.7 kg (169 lb)   Height:        Intake/Output from previous day:  Intake/Output Summary (Last 24 hours) at 03/02/16 1123 Last data filed at 03/02/16 0900  Gross per 24 hour  Intake              603 ml  Output              500 ml  Net              103 ml    Physical Exam: Moribound white male Not lucid Some UE tremor activity BS positive soft Condom catheter on Plus 3 edema with scrotal edema S1/S2 no murmur Basilar rales left effusion   Lab Results: Basic Metabolic Panel:  Recent Labs  29/56/2107/12/11 0151 03/02/16 0342  NA 140 141  K 3.5 3.2*  CL 103 108  CO2 23 24  GLUCOSE 116* 72  BUN 52* 50*  CREATININE 4.20* 3.92*  CALCIUM 7.7* 7.7*  MG  --  1.8  PHOS  --  4.8*   Liver Function Tests:  Recent Labs  03/01/16 0151 03/02/16 0342  AST 29  --   ALT 18  --   ALKPHOS 88  --   BILITOT 0.8  --   PROT 5.6*  --   ALBUMIN 2.1* 1.8*   CBC:  Recent Labs  03/01/16 0151 03/02/16 0342  WBC 7.1 5.9  NEUTROABS 4.9  --   HGB 9.6* 8.3*  HCT 30.8* 26.8*  MCV 93.3 93.7  PLT 182 162   Cardiac Enzymes:  Recent Labs  03/01/16 0151 03/01/16 0754 03/01/16 1239  TROPONINI 0.15* 0.15* 0.15*    Thyroid Function Tests:  Recent Labs  03/01/16 0151  TSH 2.928    Imaging: Dg Chest Port 1 View  Result Date: 03/01/2016 CLINICAL DATA:  Short of breath EXAM: PORTABLE CHEST 1 VIEW COMPARISON:  02/29/2016 FINDINGS: Two sternotomy wires overlies normal cardiac silhouette. Bilateral pleural effusions greater on the LEFT. No pneumothorax. Central venous  congestion. IMPRESSION: 1. No significant change. 2. Central venous congestion and bilateral pleural effusion. Moderate to large effusion on the LEFT. Electronically Signed   By: Genevive BiStewart  Edmunds M.D.   On: 03/01/2016 12:07    Cardiac Studies:  ECG: ST first degree RBBB LAFB    Telemetry:  afib rate 84    Echo: pending   Medications:   . acidophilus  1 capsule Oral Daily  . aspirin EC  81 mg Oral Daily  . cholecalciferol  1,000 Units Oral Daily  . enoxaparin (LOVENOX) injection  30 mg Subcutaneous Q24H  . furosemide  120 mg Intravenous BID  . mouth rinse  15 mL Mouth Rinse BID  . metoprolol  50 mg Oral Daily  . sodium bicarbonate  325 mg Oral Daily  . sodium chloride flush  3 mL Intravenous Q12H  . vancomycin  125 mg Oral TID AC & HS   Followed by  . [START ON 03/06/2016] vancomycin  125  mg Oral BID   Followed by  . [START ON 03/11/2016] vancomycin  125 mg Oral Daily   Followed by  . [START ON 03/16/2016] vancomycin  125 mg Oral Q48H  . vancomycin  250 mg Oral Once  . vitamin C  500 mg Oral Daily        Assessment/Plan:   Volume overload:  Have called echo lab to get done. Combination of low albumin, CRF more likely Telemetry with afib. Given drop in Hct and plan for thoracentesis will stop systemic heparin and just use DVT lovenox. Rate control is fine.   Left effusion is large and will not clear with Cr near 4 will check left lateral decubitus and plan IR US guided thoracentesis in am.   For nutrition would start jevity feeds as he clearly will not improve with PO intake IM to discuss With wife but she is ok with this and nutrition left guidelines for this   Continue oral vancomycin for Cdiff  Follow Hct transfuse if Hb less than 8   Charlton Haws 03/02/2016, 11:23 AM

## 2016-03-03 ENCOUNTER — Telehealth: Payer: Self-pay | Admitting: Cardiology

## 2016-03-03 ENCOUNTER — Inpatient Hospital Stay (HOSPITAL_COMMUNITY): Payer: Medicare Other

## 2016-03-03 ENCOUNTER — Encounter (HOSPITAL_COMMUNITY): Payer: Self-pay | Admitting: Interventional Radiology

## 2016-03-03 DIAGNOSIS — I5031 Acute diastolic (congestive) heart failure: Secondary | ICD-10-CM

## 2016-03-03 DIAGNOSIS — J939 Pneumothorax, unspecified: Secondary | ICD-10-CM

## 2016-03-03 HISTORY — PX: IR GENERIC HISTORICAL: IMG1180011

## 2016-03-03 LAB — RENAL FUNCTION PANEL
ALBUMIN: 1.8 g/dL — AB (ref 3.5–5.0)
ANION GAP: 10 (ref 5–15)
BUN: 50 mg/dL — ABNORMAL HIGH (ref 6–20)
CO2: 25 mmol/L (ref 22–32)
Calcium: 7.9 mg/dL — ABNORMAL LOW (ref 8.9–10.3)
Chloride: 106 mmol/L (ref 101–111)
Creatinine, Ser: 4.02 mg/dL — ABNORMAL HIGH (ref 0.61–1.24)
GFR calc Af Amer: 14 mL/min — ABNORMAL LOW (ref >60)
GFR calc non Af Amer: 12 mL/min — ABNORMAL LOW (ref >60)
GLUCOSE: 92 mg/dL (ref 65–99)
PHOSPHORUS: 5 mg/dL — AB (ref 2.5–4.6)
POTASSIUM: 3.2 mmol/L — AB (ref 3.5–5.1)
Sodium: 141 mmol/L (ref 135–145)

## 2016-03-03 LAB — CBC
HEMATOCRIT: 30.2 % — AB (ref 39.0–52.0)
HEMOGLOBIN: 9.6 g/dL — AB (ref 13.0–17.0)
MCH: 29.6 pg (ref 26.0–34.0)
MCHC: 31.8 g/dL (ref 30.0–36.0)
MCV: 93.2 fL (ref 78.0–100.0)
Platelets: 171 10*3/uL (ref 150–400)
RBC: 3.24 MIL/uL — ABNORMAL LOW (ref 4.22–5.81)
RDW: 16.3 % — ABNORMAL HIGH (ref 11.5–15.5)
WBC: 5.5 10*3/uL (ref 4.0–10.5)

## 2016-03-03 LAB — HEPARIN LEVEL (UNFRACTIONATED): Heparin Unfractionated: <0.1 IU/mL — ABNORMAL LOW (ref 0.30–0.70)

## 2016-03-03 LAB — RPR: RPR: NONREACTIVE

## 2016-03-03 MED ORDER — FENTANYL CITRATE (PF) 100 MCG/2ML IJ SOLN
INTRAMUSCULAR | Status: AC
  Filled 2016-03-03: qty 2

## 2016-03-03 MED ORDER — LIDOCAINE HCL 1 % IJ SOLN
INTRAMUSCULAR | Status: AC
  Filled 2016-03-03: qty 20

## 2016-03-03 MED ORDER — ENSURE ENLIVE PO LIQD
237.0000 mL | Freq: Two times a day (BID) | ORAL | Status: DC
  Administered 2016-03-03 – 2016-03-07 (×8): 237 mL via ORAL

## 2016-03-03 MED ORDER — ENOXAPARIN SODIUM 30 MG/0.3ML ~~LOC~~ SOLN
30.0000 mg | SUBCUTANEOUS | Status: DC
  Administered 2016-03-05: 30 mg via SUBCUTANEOUS
  Filled 2016-03-03 (×2): qty 0.3

## 2016-03-03 MED ORDER — HYDROCODONE-ACETAMINOPHEN 5-325 MG PO TABS
1.0000 | ORAL_TABLET | Freq: Four times a day (QID) | ORAL | Status: DC | PRN
  Administered 2016-03-03: 1 via ORAL
  Administered 2016-03-03: 2 via ORAL
  Administered 2016-03-03 – 2016-03-04 (×3): 1 via ORAL
  Administered 2016-03-04 – 2016-03-05 (×2): 2 via ORAL
  Administered 2016-03-05 – 2016-03-07 (×4): 1 via ORAL
  Filled 2016-03-03 (×3): qty 1
  Filled 2016-03-03: qty 2
  Filled 2016-03-03: qty 1
  Filled 2016-03-03 (×2): qty 2
  Filled 2016-03-03 (×5): qty 1

## 2016-03-03 MED ORDER — MIDAZOLAM HCL 2 MG/2ML IJ SOLN
INTRAMUSCULAR | Status: AC
  Filled 2016-03-03: qty 2

## 2016-03-03 MED ORDER — POTASSIUM CHLORIDE CRYS ER 20 MEQ PO TBCR
40.0000 meq | EXTENDED_RELEASE_TABLET | Freq: Two times a day (BID) | ORAL | Status: DC
  Administered 2016-03-03 – 2016-03-05 (×6): 40 meq via ORAL
  Filled 2016-03-03 (×6): qty 2

## 2016-03-03 MED ORDER — FENTANYL CITRATE (PF) 100 MCG/2ML IJ SOLN
INTRAMUSCULAR | Status: AC | PRN
  Administered 2016-03-03: 25 ug via INTRAVENOUS

## 2016-03-03 MED ORDER — PRO-STAT SUGAR FREE PO LIQD
30.0000 mL | Freq: Two times a day (BID) | ORAL | Status: DC
  Administered 2016-03-03 – 2016-03-07 (×8): 30 mL via ORAL
  Filled 2016-03-03 (×8): qty 30

## 2016-03-03 NOTE — Progress Notes (Signed)
Nutrition Follow-up  DOCUMENTATION CODES:   Severe malnutrition in context of chronic illness  INTERVENTION:    Ensure Enlive PO BID, each supplement provides 350 kcal and 20 grams of protein  Prostat 30 ml PO BID, each supplement provides 100 kcal and 15 gm protein  Mighty Shake II BID with meals, each supplement provides 480-500 kcals and 20-23 grams of protein  NUTRITION DIAGNOSIS:   Malnutrition related to chronic illness as evidenced by severe depletion of muscle mass, severe fluid accumulation, percent weight loss.  Ongoing  GOAL:   Patient will meet greater than or equal to 90% of their needs  Unmet  MONITOR:   Diet advancement, Labs, Weight trends, I & O's, TF tolerance, Other (Comment) (overall goals of care)  ASSESSMENT:   82 y o with a aPMH of CDA s/p CABG, Afib, CKD IV who presents from OSH ED with AMS and volume overload and NSTEMI. [facility] Course was complicated by acidosis, pleural effusion and toxic encephalopathy.  S/P SLP evaluation, diet was advanced to dysphagia 3 with thin liquids on 9/7. Spoke with RN, who reports that patient has been eating some, but not a lot. Will add PO supplements. No plans to start TF at this time, since PO diet has been advanced. Chest tube in place. S/P left thoracentesis on 9/7. Labs reviewed: potassium low at 3.2, phos elevated at 5. Medications reviewed and include vitamin D, Risaquad, Lasix, K-dur, sodium bicarb, vitamin C.  Diet Order:  Diet NPO time specified Except for: Sips with Meds  Skin:  Wound (see comment) (stage II to buttocks)  Last BM:  9/8 (diarrhea, C. diff)  Height:   Ht Readings from Last 1 Encounters:  03/01/16 5\' 9"  (1.753 m)    Weight:   Wt Readings from Last 1 Encounters:  03/03/16 155 lb 6.4 oz (70.5 kg)    Ideal Body Weight:  72.7 kg  BMI:  Body mass index is 22.95 kg/m.  Estimated Nutritional Needs:   Kcal:   1800-2000  Protein:  90-100 gm  Fluid:  1.8-2 L  EDUCATION NEEDS:   Education needs addressed  Joaquin CourtsKimberly Harris, RD, LDN, CNSC Pager [phone] After Hours Pager [phone]

## 2016-03-03 NOTE — Progress Notes (Signed)
RN called for second opinion. Pt alert, oriented x4, BP 132/86, Hr 101, RR 21, 98% 2L [place]. Pt had a left side pneumothorax s/p thoracentesis, repeat cxr resulted a slight progression to left pneumothorax. Schor NP made aware. New orders for routine am portable CXR placed. This RN assisted pt with getting comfortable in bed. HR decreased to 88. Pt resting comfortably. Encouraged RN to call with any concerns.

## 2016-03-03 NOTE — Consult Note (Signed)
WOC Nurse wound consult note Reason for Consult: Consult requested for bilat buttocks. Pt was noted to have these wounds as present on admission and has frequent diarrhea stools. Wound type: Bilat buttocks and inner gluteal fold is red, macerated, with patchy areas of partial and full thickness skin loss.  Appearance is consistent with moisture associated skin damage.  Wounds are red and moist with small amt yellow drainage; area is 6X6cm. Pressure Ulcer POA: These were noted as present on admission but are NOT pressure injuries Dressing procedure/placement/frequency:  Barrier cream to repel moisture and protect skin.  Air mattress to increase airflow and decrease discomfort to affected areas.  Discussed plan of care with patient and family member and they verbalized understanding. Please re-consult if further assistance is needed.  Thank-you,  Cammie Mcgeeawn Monnie Anspach MSN, RN, CWOCN, [place] Mission[place]CN-AP, CNS [phone]

## 2016-03-03 NOTE — Sedation Documentation (Signed)
Patient is resting comfortably. 

## 2016-03-03 NOTE — Sedation Documentation (Signed)
Pt had 1 Vicodin pill at 11am.

## 2016-03-03 NOTE — Procedures (Signed)
Interventional Radiology Procedure Note  Procedure: Fluoro guided placement of left chest tube for evacuation of presumed ex vacuo.  54F drain placed.   Complications: None Recommendations:  - Maintain to 7220mmH2O wall suction.   - Record output per shift.  - CXR in am.  - When absence of air-leak is documented after 1-2 days of suction, may remove tube.   - Routine care  Signed,  Yvone NeuJaime S. Loreta AveWagner, DO

## 2016-03-03 NOTE — Progress Notes (Signed)
   03/01/16 1757  Clinical Encounter Type  Visited With Patient and family together  Visit Type Spiritual support  Referral From Nurse  Spiritual Encounters  Spiritual Needs Prayer;Emotional  Stress Factors  Patient Stress Factors Health changes  Family Stress Factors Exhausted;Other (Comment) (Wife: Pt (part [place]) has strong spiirit - still recover?)  Advance Directives (For Healthcare)  Does patient have an advance directive? Yes  Type of Advance Directive Healthcare Power of Attorney  Does patient want to make changes to advanced directive? No - Patient declined  Copy of advanced directive(s) in chart? No - copy requested

## 2016-03-03 NOTE — Progress Notes (Signed)
Pt 02 titrated up to 3L, Sating 94%, absent lung sounds on left, diminished on right. WOC nurse in room with pt. Palliative and critical care consulted. Spoke with radiology, will be here to pick up pt circa 2pm. Will continue to monitor.

## 2016-03-03 NOTE — Progress Notes (Signed)
Subjective:  No respiratory distress   Objective:  Vitals:   03/02/16 2358 03/03/16 0431 03/03/16 0500 03/03/16 0743  BP: (!) 95/53 111/66  111/75  Pulse: 97 (!) 106  (!) 126  Resp: 11 16  16   Temp: 97.2 F (36.2 C) 97.7 F (36.5 C)  98 F (36.7 C)  TempSrc: Oral Oral  Axillary  SpO2: 96% 98%  95%  Weight:   70.5 kg (155 lb 6.4 oz)   Height:        Intake/Output from previous day:  Intake/Output Summary (Last 24 hours) at 03/03/16 1040 Last data filed at 03/03/16 0914  Gross per 24 hour  Intake              488 ml  Output             1500 ml  Net            -1012 ml    Physical Exam: Moribound white male Not lucid Some UE tremor activity BS positive soft Condom catheter on Plus 3 edema with scrotal edema S1/S2 no murmur Basilar rales post thoracentesis decreased apical BS  Lab Results: Basic Metabolic Panel:  Recent Labs  16/03/9608/07/17 0342 03/03/16 0521  NA 141 141  K 3.2* 3.2*  CL 108 106  CO2 24 25  GLUCOSE 72 92  BUN 50* 50*  CREATININE 3.92* 4.02*  CALCIUM 7.7* 7.9*  MG 1.8  --   PHOS 4.8* 5.0*   Liver Function Tests:  Recent Labs  03/01/16 0151 03/02/16 0342 03/03/16 0521  AST 29  --   --   ALT 18  --   --   ALKPHOS 88  --   --   BILITOT 0.8  --   --   PROT 5.6*  --   --   ALBUMIN 2.1* 1.8* 1.8*   CBC:  Recent Labs  03/01/16 0151 03/02/16 0342 03/03/16 0521  WBC 7.1 5.9 5.5  NEUTROABS 4.9  --   --   HGB 9.6* 8.3* 9.6*  HCT 30.8* 26.8* 30.2*  MCV 93.3 93.7 93.2  PLT 182 162 171   Cardiac Enzymes:  Recent Labs  03/01/16 0151 03/01/16 0754 03/01/16 1239  TROPONINI 0.15* 0.15* 0.15*    Thyroid Function Tests:  Recent Labs  03/01/16 0151  TSH 2.928    Imaging: Dg Chest 1 View  Result Date: 03/02/2016 CLINICAL DATA:  Left side pleural effusion. Post Thorocentesis on the left. Hx of CAD, HTN, NSTEMI, Pneumonia, DM2 EXAM: CHEST  1 VIEW COMPARISON:  Earlier film of the same day FINDINGS: Moderate left lateral and  apical pneumothorax, without midline shift. Resolution of pleural effusion. Degree of left lung inflation is slightly improved from prior exam. Probable layering right pleural effusion as before with stable opacities in the mid and lower right lung. Heart size upper limits normal.  Previous CABG. IMPRESSION: 1. Moderate left pleural effusion post thoracentesis, with no significant residual effusion, and overall slightly improved aeration of the left lung. Electronically Signed   By: Corlis Leak  Hassell M.D.   On: 03/02/2016 16:30   Dg Chest 2 View  Result Date: 03/02/2016 CLINICAL DATA:  Left-sided pleural effusion EXAM: CHEST  2 VIEW COMPARISON:  03/01/2016 FINDINGS: Cardiac shadow is stable. Postoperative changes are again seen. A large left pleural effusion is again noted. It is at least partially mobile. The small right effusion also appears somewhat mobile. IMPRESSION: Bilateral pleural effusions left greater than right with mobile components bilaterally. Degree  of central vascular congestion appears somewhat worse than that seen on the prior exam. Electronically Signed   By: Alcide Clever M.D.   On: 03/02/2016 12:59   Dg Chest Port 1 View  Result Date: 03/03/2016 CLINICAL DATA:  Short of breath EXAM: PORTABLE CHEST 1 VIEW COMPARISON:  03/02/2016 FINDINGS: Sternotomy wires overlie normal cardiac silhouette. Large LEFT pneumothorax again demonstrated. The LEFT upper pleural edge measures 48 mm mid chest wall increased from 27 mm. The lateral pleural edge measures 47 mm compared to 44 remeasured. Minimal expansion of the LEFT lung. Interval increase in LEFT effusion compared to 1 day prior. No clear expansion of the LEFT lung. IMPRESSION: 1. Large LEFT pneumothorax measures slightly larger than comparison exam. Note mediastinal shift. 2. No expansion of the LEFT lung. 3. Interval reaccumulation of LEFT pleural fluid. 4. Stable RIGHT effusion These results will be called to the ordering clinician or representative by  the Radiologist Assistant, and communication documented in the PACS or zVision Dashboard. Electronically Signed   By: Genevive Bi M.D.   On: 03/03/2016 08:59   Dg Chest Port 1 View  Result Date: 03/02/2016 CLINICAL DATA:  Follow-up pneumothorax EXAM: PORTABLE CHEST 1 VIEW COMPARISON:  9/7/ 17 at 16:25 FINDINGS: Cardiomediastinal silhouette is stable. Status post CABG again noted. Again noted moderate left upper and left lateral pneumothorax with atelectasis in left midlung in left lower lobe. The lateral component measures 55 mm thickness with slight progression from prior exam when measures 50 mm thickness. Stable right pleural effusion with right basilar atelectasis. IMPRESSION: Again noted moderate left upper and left lateral pneumothorax with atelectasis in left midlung in left lower lobe. The lateral component measures 55 mm thickness with slight progression from prior exam when measured 50 mm thickness. Stable right pleural effusion with right basilar atelectasis. Electronically Signed   By: Natasha Mead M.D.   On: 03/02/2016 18:36   Dg Chest Port 1 View  Result Date: 03/01/2016 CLINICAL DATA:  Short of breath EXAM: PORTABLE CHEST 1 VIEW COMPARISON:  02/29/2016 FINDINGS: Two sternotomy wires overlies normal cardiac silhouette. Bilateral pleural effusions greater on the LEFT. No pneumothorax. Central venous congestion. IMPRESSION: 1. No significant change. 2. Central venous congestion and bilateral pleural effusion. Moderate to large effusion on the LEFT. Electronically Signed   By: Genevive Bi M.D.   On: 03/01/2016 12:07   US Thoracentesis Asp Pleural Space W/img Guide  Result Date: 03/02/2016 INDICATION: Shortness of breath. Chronic kidney disease stage 4. Large left pleural effusion. Request for therapeutic thoracentesis. EXAM: ULTRASOUND GUIDED LEFT THORACENTESIS MEDICATIONS: 1% Lidocaine. COMPLICATIONS: None immediate. PROCEDURE: An ultrasound guided thoracentesis was thoroughly discussed  with the patient and questions answered. The benefits, risks, alternatives and complications were also discussed. The patient understands and wishes to proceed with the procedure. Written consent was obtained. Ultrasound was performed to localize and mark an adequate pocket of fluid in the left chest. The area was then prepped and draped in the normal sterile fashion. 1% Lidocaine was used for local anesthesia. Under ultrasound guidance a 6 Fr Safe-T-Centesis catheter was introduced. Thoracentesis was performed. The catheter was removed and a dressing applied. FINDINGS: A total of approximately 1.7 liters of clear light orange fluid was removed. IMPRESSION: Successful ultrasound guided left thoracentesis yielding 1.7 liters of pleural fluid. Read by:  Corrin Parker, PA-C Electronically Signed   By: Corlis Leak M.D.   On: 03/02/2016 16:10    Cardiac Studies:  ECG: ST first degree RBBB LAFB  Telemetry:  afib rate 84    Echo: pending   Medications:   . acidophilus  1 capsule Oral Daily  . aspirin EC  81 mg Oral Daily  . cholecalciferol  1,000 Units Oral Daily  . enoxaparin (LOVENOX) injection  30 mg Subcutaneous Q24H  . furosemide  120 mg Intravenous BID  . mouth rinse  15 mL Mouth Rinse BID  . metoprolol  50 mg Oral Daily  . potassium chloride  40 mEq Oral BID  . sodium bicarbonate  325 mg Oral Daily  . sodium chloride flush  3 mL Intravenous Q12H  . vancomycin  125 mg Oral TID AC & HS   Followed by  . [START ON 03/06/2016] vancomycin  125 mg Oral BID   Followed by  . [START ON 03/11/2016] vancomycin  125 mg Oral Daily   Followed by  . [START ON 03/16/2016] vancomycin  125 mg Oral Q48H  . vancomycin  250 mg Oral Once  . vitamin C  500 mg Oral Daily        Assessment/Plan:   Volume overload:  More related to low albumin and CRF EF is normal by echo and unable to assess diastolic function due to afib.  Continue lasix   Left effusion post thoracentesis discussed with dr Dorris Fetch ?  pneumo and trapped lung Not ideal candidate for chest tube   For nutrition would start jevity feeds as he clearly will not improve with PO intake IM to discuss With wife but she is ok with this and nutrition left guidelines for this   Continue oral vancomycin for Cdiff  Follow Hct transfuse if Hb less than 8   No further cardiology recs will sign off  Charlton Haws 03/03/2016, 10:40 AM

## 2016-03-03 NOTE — Sedation Documentation (Signed)
Chest tube in placed and taped well. Sats ok. Pt only got IV Fentanyl due to already being slight lethargic

## 2016-03-03 NOTE — Progress Notes (Signed)
Patient ID: [name] [name], male   DOB: 12-17-1927, 82 y.o.   MRN: [id number] S:lethargic but arousable O:BP 90/60   Pulse 84   Temp 98 F (36.7 C) (Axillary)   Resp 17   Ht 5\' 9"  (1.753 m)   Wt 70.5 kg (155 lb 6.4 oz)   SpO2 94%   BMI 22.95 kg/m   Intake/Output Summary (Last 24 hours) at 03/03/16 1300 Last data filed at 03/03/16 1206  Gross per 24 hour  Intake              668 ml  Output             1500 ml  Net             -832 ml   Intake/Output: I/O last 3 completed shifts: In: 528 [P.O.:420; I.V.:108] Out: 2000 [Urine:2000]  Intake/Output this shift:  Total I/O In: 668 [P.O.:420; IV Piggyback:248] Out: -  Weight change: -6.169 kg (-13 lb 9.6 oz) WUJ:WJXBJYNWG chronically ill-appearing WM. Lethargic but arousable CVS:no rub Resp:occ rhonchi NFA:OZHYQM Ext:2+ lower extremity edema.   Recent Labs Lab 03/01/16 0151 03/02/16 0342 03/03/16 0521  NA 140 141 141  K 3.5 3.2* 3.2*  CL 103 108 106  CO2 23 24 25   GLUCOSE 116* 72 92  BUN 52* 50* 50*  CREATININE 4.20* 3.92* 4.02*  ALBUMIN 2.1* 1.8* 1.8*  CALCIUM 7.7* 7.7* 7.9*  PHOS  --  4.8* 5.0*  AST 29  --   --   ALT 18  --   --    Liver Function Tests:  Recent Labs Lab 03/01/16 0151 03/02/16 0342 03/03/16 0521  AST 29  --   --   ALT 18  --   --   ALKPHOS 88  --   --   BILITOT 0.8  --   --   PROT 5.6*  --   --   ALBUMIN 2.1* 1.8* 1.8*   No results for input(s): LIPASE, AMYLASE in the last 168 hours.  Recent Labs Lab 03/01/16 1035  AMMONIA 23   CBC:  Recent Labs Lab 03/01/16 0151 03/02/16 0342 03/03/16 0521  WBC 7.1 5.9 5.5  NEUTROABS 4.9  --   --   HGB 9.6* 8.3* 9.6*  HCT 30.8* 26.8* 30.2*  MCV 93.3 93.7 93.2  PLT 182 162 171   Cardiac Enzymes:  Recent Labs Lab 03/01/16 0151 03/01/16 0754 03/01/16 1239  TROPONINI 0.15* 0.15* 0.15*   CBG: No results for input(s): GLUCAP in the last 168 hours.  Iron Studies:  Recent Labs  03/01/16 1730  IRON 60  TIBC 116*  FERRITIN  594*   Studies/Results: Dg Chest 1 View  Result Date: 03/02/2016 CLINICAL DATA:  Left side pleural effusion. Post Thorocentesis on the left. Hx of CAD, HTN, NSTEMI, Pneumonia, DM2 EXAM: CHEST  1 VIEW COMPARISON:  Earlier film of the same day FINDINGS: Moderate left lateral and apical pneumothorax, without midline shift. Resolution of pleural effusion. Degree of left lung inflation is slightly improved from prior exam. Probable layering right pleural effusion as before with stable opacities in the mid and lower right lung. Heart size upper limits normal.  Previous CABG. IMPRESSION: 1. Moderate left pleural effusion post thoracentesis, with no significant residual effusion, and overall slightly improved aeration of the left lung. Electronically Signed   By: Corlis Leak M.D.   On: 03/02/2016 16:30   Dg Chest 2 View  Result Date: 03/02/2016 CLINICAL DATA:  Left-sided pleural effusion EXAM: CHEST  2  VIEW COMPARISON:  03/01/2016 FINDINGS: Cardiac shadow is stable. Postoperative changes are again seen. A large left pleural effusion is again noted. It is at least partially mobile. The small right effusion also appears somewhat mobile. IMPRESSION: Bilateral pleural effusions left greater than right with mobile components bilaterally. Degree of central vascular congestion appears somewhat worse than that seen on the prior exam. Electronically Signed   By: Alcide CleverMark  Lukens M.D.   On: 03/02/2016 12:59   Dg Chest Port 1 View  Result Date: 03/03/2016 CLINICAL DATA:  Short of breath EXAM: PORTABLE CHEST 1 VIEW COMPARISON:  03/02/2016 FINDINGS: Sternotomy wires overlie normal cardiac silhouette. Large LEFT pneumothorax again demonstrated. The LEFT upper pleural edge measures 48 mm mid chest wall increased from 27 mm. The lateral pleural edge measures 47 mm compared to 44 remeasured. Minimal expansion of the LEFT lung. Interval increase in LEFT effusion compared to 1 day prior. No clear expansion of the LEFT lung. IMPRESSION:  1. Large LEFT pneumothorax measures slightly larger than comparison exam. Note mediastinal shift. 2. No expansion of the LEFT lung. 3. Interval reaccumulation of LEFT pleural fluid. 4. Stable RIGHT effusion These results will be called to the ordering clinician or representative by the Radiologist Assistant, and communication documented in the PACS or zVision Dashboard. Electronically Signed   By: Genevive BiStewart  Edmunds M.D.   On: 03/03/2016 08:59   Dg Chest Port 1 View  Result Date: 03/02/2016 CLINICAL DATA:  Follow-up pneumothorax EXAM: PORTABLE CHEST 1 VIEW COMPARISON:  9/7/ 17 at 16:25 FINDINGS: Cardiomediastinal silhouette is stable. Status post CABG again noted. Again noted moderate left upper and left lateral pneumothorax with atelectasis in left midlung in left lower lobe. The lateral component measures 55 mm thickness with slight progression from prior exam when measures 50 mm thickness. Stable right pleural effusion with right basilar atelectasis. IMPRESSION: Again noted moderate left upper and left lateral pneumothorax with atelectasis in left midlung in left lower lobe. The lateral component measures 55 mm thickness with slight progression from prior exam when measured 50 mm thickness. Stable right pleural effusion with right basilar atelectasis. Electronically Signed   By: Natasha MeadLiviu  Pop M.D.   On: 03/02/2016 18:36   [place] Thoracentesis Asp Pleural Space W/img Guide  Result Date: 03/02/2016 INDICATION: Shortness of breath. Chronic kidney disease stage 4. Large left pleural effusion. Request for therapeutic thoracentesis. EXAM: ULTRASOUND GUIDED LEFT THORACENTESIS MEDICATIONS: 1% Lidocaine. COMPLICATIONS: None immediate. PROCEDURE: An ultrasound guided thoracentesis was thoroughly discussed with the patient and questions answered. The benefits, risks, alternatives and complications were also discussed. The patient understands and wishes to proceed with the procedure. Written consent was obtained. Ultrasound was  performed to localize and mark an adequate pocket of fluid in the left chest. The area was then prepped and draped in the normal sterile fashion. 1% Lidocaine was used for local anesthesia. Under ultrasound guidance a 6 Fr Safe-T-Centesis catheter was introduced. Thoracentesis was performed. The catheter was removed and a dressing applied. FINDINGS: A total of approximately 1.7 liters of clear light orange fluid was removed. IMPRESSION: Successful ultrasound guided left thoracentesis yielding 1.7 liters of pleural fluid. Read by:  Corrin ParkerWendy Blair, PA-C Electronically Signed   By: Corlis Leak  Hassell M.D.   On: 03/02/2016 16:10   . acidophilus  1 capsule Oral Daily  . aspirin EC  81 mg Oral Daily  . cholecalciferol  1,000 Units Oral Daily  . [START ON 03/04/2016] enoxaparin (LOVENOX) injection  30 mg Subcutaneous Q24H  . feeding supplement (ENSURE ENLIVE)  237 mL Oral BID BM  . feeding supplement (PRO-STAT SUGAR FREE 64)  30 mL Oral BID  . furosemide  120 mg Intravenous BID  . mouth rinse  15 mL Mouth Rinse BID  . metoprolol  50 mg Oral Daily  . potassium chloride  40 mEq Oral BID  . sodium bicarbonate  325 mg Oral Daily  . sodium chloride flush  3 mL Intravenous Q12H  . vancomycin  125 mg Oral TID AC & HS   Followed by  . [START ON 03/06/2016] vancomycin  125 mg Oral BID   Followed by  . [START ON 03/11/2016] vancomycin  125 mg Oral Daily   Followed by  . [START ON 03/16/2016] vancomycin  125 mg Oral Q48H  . vancomycin  250 mg Oral Once  . vitamin C  500 mg Oral Daily    BMET    Component Value Date/Time   NA 141 03/03/2016 0521   K 3.2 (L) 03/03/2016 0521   CL 106 03/03/2016 0521   CO2 25 03/03/2016 0521   GLUCOSE 92 03/03/2016 0521   BUN 50 (H) 03/03/2016 0521   CREATININE 4.02 (H) 03/03/2016 0521   CALCIUM 7.9 (L) 03/03/2016 0521   CALCIUM 8.2 (L) 05/16/2008 0330   GFRNONAA 12 (L) 03/03/2016 0521   GFRAA 14 (L) 03/03/2016 0521   CBC    Component Value Date/Time   WBC 5.5 03/03/2016 0521    RBC 3.24 (L) 03/03/2016 0521   HGB 9.6 (L) 03/03/2016 0521   HCT 30.2 (L) 03/03/2016 0521   PLT 171 03/03/2016 0521   MCV 93.2 03/03/2016 0521   MCH 29.6 03/03/2016 0521   MCHC 31.8 03/03/2016 0521   RDW 16.3 (H) 03/03/2016 0521   LYMPHSABS 1.5 03/01/2016 0151   MONOABS 0.6 03/01/2016 0151   EOSABS 0.1 03/01/2016 0151   BASOSABS 0.0 03/01/2016 0151     Assessment/Plan: 1. AKI/CKD- not much off his baseline. Pt is a poor candidate for HD and agree with palliative care consult, although family does not appear to be realistic of his outcome/prognosis. He does have Nephrology follow up with [facility] and a transfer back to [place] for second opinion may be beneficial to family.  1. Scr improved somewhat but not a significant change in overall GFR. 2. He has advanced CKD at baseline and clinically his condition has deteriorated.  He is not a candidate for dialysis 2. AMS- unclear etiology, doubt uremia as his Scr has been much worse in the recent past. W/u per primary svc 3. NSTEMI- per cardiology 1. Awaiting ECHO results 4. CHF- agree with IV Lasix but will need to continue to follow Scr as he is not HD candidate. 5. Pleural effusion- s/p thoracentesis and CT surgery consulted for Chest tube due to large left pneumothorax and no expansion of leftlung. Also with re-accumulation of pleural effusion. 6. Anemia of chronic disease- will check iron studies and consider ESA 7. C diff- on po vanco 8. Deconditioning- was in SNF and according to family was about to participate with PT when he became ill. 9. Severe protein malnutrition.  Agree with feeding tube 10. Disposition- overall prognosis is poor. Agree with palliative care consult.  Nothing further to add.  Will sign off.  Please call with questions or concerns. Julien Nordmann Kaladin Noseworthy

## 2016-03-03 NOTE — Care Management Important Message (Signed)
Important Message  Patient Details  Name: [name] KeelsRichard [name] MRN: [id number] Date of Birth: 06/16/1928   Medicare Important Message Given:  Yes    Gala LewandowskyGraves-Bigelow, Jasmane Brockway Kaye, RN 03/03/2016, 2:10 PM

## 2016-03-03 NOTE — Consult Note (Signed)
PULMONARY / CRITICAL CARE MEDICINE   Name: [name] [name] MRN: [id number] DOB: 1928-02-20    ADMISSION DATE:  03/01/2016 CONSULTATION DATE:  03/03/16  REFERRING MD:  Dr. Erlinda Hong  CHIEF COMPLAINT:  Failure to Thrive   HISTORY OF PRESENT ILLNESS:   82 y/o M with extensive PMH to include arthritis, bilateral inguinal hernia, HTN, HLD, CAD s/p NSTEMI / CABG x3 (1997), stents in 2010, malnutrition (dating back to 2015), DM II, depression, CKD 5 (not on HD / previously declined, followed by Dr. Aldona Bar at [facility], baseline sr cr 3.8-5) and recent hospitalization at [facility] for one month for C-Diff colitis.  Hospital course complicated by acidosis, pleural effusion and encephalopathy.  Post discharge, he was transferred 9/1 to a SNF in [place].  On 9/6, he was accepted in transfer by Cardiology with volume overload, elevated BNP, troponin 0.1, AF with RVR.    Upon evaluation, he was found to be severely deconditioned, malnourished, altered with concerns of acute on chronic diastolic HF exacerbated by AF with RVR.  Palliative Care was called on admission.  The family has insisted he remain a full code.  He was treated with IV lasix, heparin gtt and lopressor.  Nephrology was consulted on admit and declared that he is a poor candidate for HD and agreed with palliative care consult.    Initial chest XRAY demonstrated a moderate to large left pleural effusion.  IR was consulted 9/7 for thoracentesis with 1.7L fluid removed.  Post thoracentesis completion CXR showed a small pneumothorax.  Follow up CXR on 9/8 showed enlarging pneumothorax and re-accumulation of pleural fluid.  Further, CVTS was consulted and they recommended placement of a chest tube noting that given the appearance of the lung on prior CT with calcified pleura the end-point would be absence of air leak and not complete re-expansion of the lung.    The patients family met with Palliative Care and refused any palliative interventions.   Their end goals would be to continue interventions, push him to rehab and get him back home.  Pt remains a full code.  Staff concerned with noted decline in mental status, increased O2 needs that he may be transitioning toward death.  Subsequently, PCCM consulted for evaluation.    PAST MEDICAL HISTORY :  He  has a past medical history of Arthritis; Bilateral inguinal hernia (BIH), left scrotal (11/28/2011); C. difficile colitis; CAD; HYPERLIPIDEMIA; HYPERTENSION; Malnutrition ([place]); NSTEMI (non-ST elevated myocardial infarction) ([place]); Pneumonia (1943); RENAL INSUFFICIENCY; and Type II diabetes mellitus ([place]).  PAST SURGICAL HISTORY: He  has a past surgical history that includes Appendectomy; Tonsillectomy; Cataract extraction w/ intraocular lens  implant, bilateral (Bilateral); Coronary angioplasty with stent (2010); Coronary artery bypass graft (~ 1999); and Scrotal surgery (Left).  Allergies  Allergen Reactions  . Statins Other (See Comments)    Per MAR at [facility]    No current facility-administered medications on file prior to encounter.    Current Outpatient Prescriptions on File Prior to Encounter  Medication Sig  . escitalopram (LEXAPRO) 10 MG tablet Take 10 mg by mouth daily.   . furosemide (LASIX) 40 MG tablet Take 40 mg by mouth daily as needed for fluid.   . Multiple Vitamins-Minerals (EYE VITAMINS) CAPS Take 1 capsule by mouth daily.   Marland Kitchen NITROSTAT 0.4 MG SL tablet DISSOLVE ONE TABLET UNDER THE TONGUE EVERY 5 MINUTES AS NEEDED FOR CHEST PAIN.  DO NOT EXCEED A TOTAL OF 3 DOSES IN 15 MINUTES (Patient taking differently: Place  0.4 mg under the tongue every 5 (five) minutes as needed for chest pain (Do not exceed a total of 3 doses in 15 minutes). )    FAMILY HISTORY:  His indicated that his mother is deceased. He indicated that his father is deceased. He indicated that his maternal grandmother is deceased. He indicated that his maternal grandfather is deceased. He indicated  that his paternal grandmother is deceased. He indicated that his paternal grandfather is deceased.    SOCIAL HISTORY: He  reports that he has never smoked. He has never used smokeless tobacco. He reports that he does not drink alcohol or use drugs.  REVIEW OF SYSTEMS:   Unable to complete with patient. Information obtained from staff, prior medical documentation and family.    SUBJECTIVE:    VITAL SIGNS: BP 90/60   Pulse 84   Temp 98 F (36.7 C) (Axillary)   Resp 17   Ht 5' 9"  (1.753 m)   Wt 155 lb 6.4 oz (70.5 kg)   SpO2 94%   BMI 22.95 kg/m   HEMODYNAMICS:    VENTILATOR SETTINGS:    INTAKE / OUTPUT: I/O last 3 completed shifts: In: 528 [P.O.:420; I.V.:108] Out: 2000 [Urine:2000]  PHYSICAL EXAMINATION: General:  Cachectic male in NAD Neuro:  Awake, alert, oriented to self, generalized weakness  HEENT:  MM pink/dry,no jvd, temporal wasting  Cardiovascular:  s1s2 distant tones  Lungs:  Even/non-labored on 2L per [place], lungs bilaterally with basilar crackles, L chest tube to suction with serosanguinous drainage Abdomen:  Soft,non-tender, bsx4 active  Musculoskeletal:  No acute deformities  Skin:  Warm/dry, no edema   LABS:  BMET  Recent Labs Lab 03/01/16 0151 03/02/16 0342 03/03/16 0521  NA 140 141 141  K 3.5 3.2* 3.2*  CL 103 108 106  CO2 23 24 25   BUN 52* 50* 50*  CREATININE 4.20* 3.92* 4.02*  GLUCOSE 116* 72 92    Electrolytes  Recent Labs Lab 03/01/16 0151 03/02/16 0342 03/03/16 0521  CALCIUM 7.7* 7.7* 7.9*  MG  --  1.8  --   PHOS  --  4.8* 5.0*    CBC  Recent Labs Lab 03/01/16 0151 03/02/16 0342 03/03/16 0521  WBC 7.1 5.9 5.5  HGB 9.6* 8.3* 9.6*  HCT 30.8* 26.8* 30.2*  PLT 182 162 171    Coag's  Recent Labs Lab 03/01/16 0151  INR 1.52    Sepsis Markers No results for input(s): LATICACIDVEN, PROCALCITON, O2SATVEN in the last 168 hours.  ABG  Recent Labs Lab 03/01/16 1150 03/02/16 2115  PHART 7.378 7.408  PCO2ART  41.8 40.3  PO2ART 86.8 70.2*    Liver Enzymes  Recent Labs Lab 03/01/16 0151 03/02/16 0342 03/03/16 0521  AST 29  --   --   ALT 18  --   --   ALKPHOS 88  --   --   BILITOT 0.8  --   --   ALBUMIN 2.1* 1.8* 1.8*    Cardiac Enzymes  Recent Labs Lab 03/01/16 0151 03/01/16 0754 03/01/16 1239  TROPONINI 0.15* 0.15* 0.15*    Glucose No results for input(s): GLUCAP in the last 168 hours.  Imaging Dg Chest 1 View  Result Date: 03/02/2016 CLINICAL DATA:  Left side pleural effusion. Post Thorocentesis on the left. Hx of CAD, HTN, NSTEMI, Pneumonia, DM2 EXAM: CHEST  1 VIEW COMPARISON:  Earlier film of the same day FINDINGS: Moderate left lateral and apical pneumothorax, without midline shift. Resolution of pleural effusion. Degree of left lung inflation  is slightly improved from prior exam. Probable layering right pleural effusion as before with stable opacities in the mid and lower right lung. Heart size upper limits normal.  Previous CABG. IMPRESSION: 1. Moderate left pleural effusion post thoracentesis, with no significant residual effusion, and overall slightly improved aeration of the left lung. Electronically Signed   By: Lucrezia Europe M.D.   On: 03/02/2016 16:30   Dg Chest Port 1 View  Result Date: 03/03/2016 CLINICAL DATA:  Short of breath EXAM: PORTABLE CHEST 1 VIEW COMPARISON:  03/02/2016 FINDINGS: Sternotomy wires overlie normal cardiac silhouette. Large LEFT pneumothorax again demonstrated. The LEFT upper pleural edge measures 48 mm mid chest wall increased from 27 mm. The lateral pleural edge measures 47 mm compared to 44 remeasured. Minimal expansion of the LEFT lung. Interval increase in LEFT effusion compared to 1 day prior. No clear expansion of the LEFT lung. IMPRESSION: 1. Large LEFT pneumothorax measures slightly larger than comparison exam. Note mediastinal shift. 2. No expansion of the LEFT lung. 3. Interval reaccumulation of LEFT pleural fluid. 4. Stable RIGHT effusion  These results will be called to the ordering clinician or representative by the Radiologist Assistant, and communication documented in the PACS or zVision Dashboard. Electronically Signed   By: Suzy Bouchard M.D.   On: 03/03/2016 08:59   Dg Chest Port 1 View  Result Date: 03/02/2016 CLINICAL DATA:  Follow-up pneumothorax EXAM: PORTABLE CHEST 1 VIEW COMPARISON:  9/7/ 17 at 16:25 FINDINGS: Cardiomediastinal silhouette is stable. Status post CABG again noted. Again noted moderate left upper and left lateral pneumothorax with atelectasis in left midlung in left lower lobe. The lateral component measures 55 mm thickness with slight progression from prior exam when measures 50 mm thickness. Stable right pleural effusion with right basilar atelectasis. IMPRESSION: Again noted moderate left upper and left lateral pneumothorax with atelectasis in left midlung in left lower lobe. The lateral component measures 55 mm thickness with slight progression from prior exam when measured 50 mm thickness. Stable right pleural effusion with right basilar atelectasis. Electronically Signed   By: Lahoma Crocker M.D.   On: 03/02/2016 18:36   US Thoracentesis Asp Pleural Space W/img Guide  Result Date: 03/02/2016 INDICATION: Shortness of breath. Chronic kidney disease stage 4. Large left pleural effusion. Request for therapeutic thoracentesis. EXAM: ULTRASOUND GUIDED LEFT THORACENTESIS MEDICATIONS: 1% Lidocaine. COMPLICATIONS: None immediate. PROCEDURE: An ultrasound guided thoracentesis was thoroughly discussed with the patient and questions answered. The benefits, risks, alternatives and complications were also discussed. The patient understands and wishes to proceed with the procedure. Written consent was obtained. Ultrasound was performed to localize and mark an adequate pocket of fluid in the left chest. The area was then prepped and draped in the normal sterile fashion. 1% Lidocaine was used for local anesthesia. Under ultrasound  guidance a 6 Fr Safe-T-Centesis catheter was introduced. Thoracentesis was performed. The catheter was removed and a dressing applied. FINDINGS: A total of approximately 1.7 liters of clear light orange fluid was removed. IMPRESSION: Successful ultrasound guided left thoracentesis yielding 1.7 liters of pleural fluid. Read by:  Gareth Eagle, PA-C Electronically Signed   By: Lucrezia Europe M.D.   On: 03/02/2016 16:10     STUDIES:  9/7  Pleural Fluid >>   CULTURES: RPR 9/7 >> non-reactive  ANTIBIOTICS: Vanco PO (c-diff) 9/8 >>   SIGNIFICANT EVENTS: 9/06  Admit to Cardiology with decompensated CHF, AF with RVR 9/07  IR thora, small ptx post (? Ex-vacuo) 9/08  Increased size of ptx,  CVTS rec's for small bore chest tube per IR   DISCUSSION: 82 y/o frail elderly male with multiple medical problems and failure to thrive admitted per Cardiology for decompensated CHF, AF with RVR. Found to have effusion s/p thora per IR with small ptx post.    ASSESSMENT / PLAN:  Left Pneumothorax - s/p thoracentesis for large chronic left pleural effusion  Left Pleural Effusion - no studies sent for analysis, appears chronic on CT  Lethargy - in setting of failure to thrive  Failure to Thrive  Atrial Fibrillation with RVR - now rate controlled Decompensated Combined Systolic / Diastolic CHF  Severe Protein Calorie Malnutrition C-Diff Colitis   Plan: Continue aggressive medical care > IVF, antibiotics, nutritional support, pain control  Vancomycin per primary for C-Diff  CT management per IR, CVTS  Follow CXR intermittently  Assess CXR post CT placement  CT to suction > goal for absence of air leak and not complete re-expansion of the lung. O2 as needed to support sats > 92%  Incentive spirometry / pulmonary hygiene   FAMILY  - Updates:  Family updated at bedside extensively per Dr. Titus Mould.  They continue to desire "aggressive medical care".  Reviewed concept of intubation / CPR with family and that  it performed it would not improve his quality of life or make him better.  Specifically, this type of care is medically futile in his circumstance and would only add to his suffering.  Wife and son want "to talk about it with the family".    - Inter-disciplinary family meet or Palliative Care meeting due by: ongoing    Noe Gens, NP-C [facility] Pgr: [phone] or if no answer [phone] 03/03/2016, 1:48 PM   STAFF NOTE: I, Merrie Roof, MD FACP have personally reviewed patient's available data, including medical history, events of note, physical examination and test results as part of my evaluation. I have discussed with resident/NP and other care providers such as pharmacist, RN and RRT. In addition, I personally evaluated patient and elicited key findings of: awake, follows commands, some confusion, s/p Chest tube pigtail for PTX, my suspicion is that this effusion is old and no lung injury took place but lack of re expansion after thora, I am not clear if lung will re expand all the way, he is severe malnourished with Mult orgain failure and refused HD in past and now is NOT a candidate for HD, would repeat pcxr now, keep pigtail to suction, prognosis is poor for survival, ETT is not needed and also would be futile and medically ineffective and not offered to family, family wants to talk to their other son about code status, ACLS , heroics of any kind would be medically ineffective and futile, fortunately abg wnl and no distress or need for further support, continue lasix tro neg balance as able and follow renal fxn and lytes, will revisit in am for chest tube management, no role to move him to sdu or icu  Lavon Paganini. Titus Mould, MD, [place] Pgr: [facility] 03/03/2016 3:46 PM

## 2016-03-03 NOTE — Consult Note (Signed)
Chief Complaint: Patient was seen in consultation today for left chest tube placement at the request of Dr Andrey Spearman  Referring Physician(s): Dr Andrey Spearman  Supervising Physician: Gilmer Mor  Patient Status: Inpatient  History of Present Illness: [name] [name] is a 82 y.o. male   Long Hx CAD/Cabg A fib; CHF and stage IV renal disease Malnutrition  Chronic left pleural effusion Left thoracentesis 9/7 with resulting small PTX todays CXR shows larger PTX  IMPRESSION: 1. Large LEFT pneumothorax measures slightly larger than comparison exam. Note mediastinal shift. 2. No expansion of the LEFT lung. 3. Interval reaccumulation of LEFT pleural fluid. 4. Stable RIGHT effusion  Asymptomatic  Pt is in no distress Dr Dorris Fetch consulted on pt and had recommended IR chest tube placement for now Dr Loreta Ave has reviewed imaging and chart--- approves procedure  Past Medical History:  Diagnosis Date  . Arthritis   . Bilateral inguinal hernia (BIH), left scrotal 11/28/2011  . C. difficile colitis   . CAD    CABG x 3 in 1997 by Dr. Laneta Simmers; PCI in 2009  . HYPERLIPIDEMIA   . HYPERTENSION   . Malnutrition (HCC)   . NSTEMI (non-ST elevated myocardial infarction) (HCC)   . Pneumonia 1943  . RENAL INSUFFICIENCY   . Type II diabetes mellitus (HCC)     Past Surgical History:  Procedure Laterality Date  . APPENDECTOMY    . CATARACT EXTRACTION W/ INTRAOCULAR LENS  IMPLANT, BILATERAL Bilateral   . CORONARY ANGIOPLASTY WITH STENT PLACEMENT  2010  . CORONARY ARTERY BYPASS GRAFT  ~ 1999   "triple"  . SCROTAL SURGERY Left   . TONSILLECTOMY      Allergies: Statins  Medications: Prior to Admission medications   Medication Sig Start Date End Date Taking? Authorizing Provider  acetaminophen (TYLENOL) 650 MG CR tablet Take 650 mg by mouth every 4 (four) hours as needed for pain.   Yes Historical Provider, MD  escitalopram (LEXAPRO) 10 MG tablet Take 10 mg by mouth  daily.  11/18/15  Yes Historical Provider, MD  furosemide (LASIX) 40 MG tablet Take 40 mg by mouth daily as needed for fluid.  10/20/15 10/19/16 Yes Historical Provider, MD  metoprolol succinate (TOPROL-XL) 50 MG 24 hr tablet Take 50 mg by mouth daily. Take with or immediately following a meal.   Yes Historical Provider, MD  mirtazapine (REMERON) 15 MG tablet Take 15 mg by mouth at bedtime.   Yes Historical Provider, MD  Multiple Vitamins-Minerals (EYE VITAMINS) CAPS Take 1 capsule by mouth daily.    Yes Historical Provider, MD  multivitamin (RENA-VIT) TABS tablet Take 1 tablet by mouth daily.   Yes Historical Provider, MD  Probiotic Product (ALIGN) 4 MG CAPS Take 4 mg by mouth daily.   Yes Historical Provider, MD  risperiDONE (RISPERDAL) 0.25 MG tablet Take 0.25 mg by mouth at bedtime.   Yes Historical Provider, MD  sodium bicarbonate 650 MG tablet Take 1,300 mg by mouth 2 (two) times daily.   Yes Historical Provider, MD  vancomycin (VANCOCIN) 250 MG capsule Take 500 mg by mouth every 6 (six) hours. 02/26/16  Yes Historical Provider, MD  NITROSTAT 0.4 MG SL tablet DISSOLVE ONE TABLET UNDER THE TONGUE EVERY 5 MINUTES AS NEEDED FOR CHEST PAIN.  DO NOT EXCEED A TOTAL OF 3 DOSES IN 15 MINUTES Patient taking differently: Place 0.4 mg under the tongue every 5 (five) minutes as needed for chest pain (Do not exceed a total of 3 doses in 15 minutes).  11/14/13   [name], MD     History reviewed. No pertinent family history.  Social History   Social History  . Marital status: Married    Spouse name: N/A  . Number of children: N/A  . Years of education: N/A   Social History Main Topics  . Smoking status: Never Smoker  . Smokeless tobacco: Never Used  . Alcohol use No  . Drug use: No  . Sexual activity: Not Currently   Other Topics Concern  . None   Social History Narrative  . None    Review of Systems: A 12 point ROS discussed and pertinent positives are indicated in the HPI above.  All  other systems are negative.  Review of Systems  Constitutional: Positive for fatigue and unexpected weight change. Negative for activity change and fever.  Respiratory: Negative for cough, chest tightness and shortness of breath.   Cardiovascular: Negative for chest pain.  Neurological: Positive for weakness.  Psychiatric/Behavioral: Negative for behavioral problems and confusion.    Vital Signs: BP 111/75 (BP Location: Left Arm)   Pulse (!) 126   Temp 98 F (36.7 C) (Axillary)   Resp 16   Ht 5\' 9"  (1.753 m)   Wt 155 lb 6.4 oz (70.5 kg)   SpO2 95%   BMI 22.95 kg/m   Physical Exam  Constitutional:  Thin; frail Ill appearing  Cardiovascular: Normal rate and regular rhythm.   Pulmonary/Chest: Effort normal. He has wheezes.  Abdominal: Soft. Bowel sounds are normal.  Musculoskeletal: Normal range of motion.  Neurological: He is alert.  Skin: Skin is warm and dry.  Psychiatric:  Consented with wife at bedside  Nursing note and vitals reviewed.   Mallampati Score:  MD Evaluation Airway: WNL Heart: WNL Abdomen: WNL Chest/ Lungs: WNL ASA  Classification: 3 Mallampati/Airway Score: One  Imaging: Dg Chest 1 View  Result Date: 03/02/2016 CLINICAL DATA:  Left side pleural effusion. Post Thorocentesis on the left. Hx of CAD, HTN, NSTEMI, Pneumonia, DM2 EXAM: CHEST  1 VIEW COMPARISON:  Earlier film of the same day FINDINGS: Moderate left lateral and apical pneumothorax, without midline shift. Resolution of pleural effusion. Degree of left lung inflation is slightly improved from prior exam. Probable layering right pleural effusion as before with stable opacities in the mid and lower right lung. Heart size upper limits normal.  Previous CABG. IMPRESSION: 1. Moderate left pleural effusion post thoracentesis, with no significant residual effusion, and overall slightly improved aeration of the left lung. Electronically Signed   By: Corlis Leak M.D.   On: 03/02/2016 16:30   Dg Chest 2  View  Result Date: 03/02/2016 CLINICAL DATA:  Left-sided pleural effusion EXAM: CHEST  2 VIEW COMPARISON:  03/01/2016 FINDINGS: Cardiac shadow is stable. Postoperative changes are again seen. A large left pleural effusion is again noted. It is at least partially mobile. The small right effusion also appears somewhat mobile. IMPRESSION: Bilateral pleural effusions left greater than right with mobile components bilaterally. Degree of central vascular congestion appears somewhat worse than that seen on the prior exam. Electronically Signed   By: Alcide Clever M.D.   On: 03/02/2016 12:59   Dg Chest Port 1 View  Result Date: 03/03/2016 CLINICAL DATA:  Short of breath EXAM: PORTABLE CHEST 1 VIEW COMPARISON:  03/02/2016 FINDINGS: Sternotomy wires overlie normal cardiac silhouette. Large LEFT pneumothorax again demonstrated. The LEFT upper pleural edge measures 48 mm mid chest wall increased from 27 mm. The lateral pleural edge measures 47 mm compared  to 44 remeasured. Minimal expansion of the LEFT lung. Interval increase in LEFT effusion compared to 1 day prior. No clear expansion of the LEFT lung. IMPRESSION: 1. Large LEFT pneumothorax measures slightly larger than comparison exam. Note mediastinal shift. 2. No expansion of the LEFT lung. 3. Interval reaccumulation of LEFT pleural fluid. 4. Stable RIGHT effusion These results will be called to the ordering clinician or representative by the Radiologist Assistant, and communication documented in the PACS or zVision Dashboard. Electronically Signed   By: Genevive BiStewart  Edmunds M.D.   On: 03/03/2016 08:59   Dg Chest Port 1 View  Result Date: 03/02/2016 CLINICAL DATA:  Follow-up pneumothorax EXAM: PORTABLE CHEST 1 VIEW COMPARISON:  9/7/ 17 at 16:25 FINDINGS: Cardiomediastinal silhouette is stable. Status post CABG again noted. Again noted moderate left upper and left lateral pneumothorax with atelectasis in left midlung in left lower lobe. The lateral component measures 55 mm  thickness with slight progression from prior exam when measures 50 mm thickness. Stable right pleural effusion with right basilar atelectasis. IMPRESSION: Again noted moderate left upper and left lateral pneumothorax with atelectasis in left midlung in left lower lobe. The lateral component measures 55 mm thickness with slight progression from prior exam when measured 50 mm thickness. Stable right pleural effusion with right basilar atelectasis. Electronically Signed   By: Natasha MeadLiviu  Pop M.D.   On: 03/02/2016 18:36   Dg Chest Port 1 View  Result Date: 03/01/2016 CLINICAL DATA:  Short of breath EXAM: PORTABLE CHEST 1 VIEW COMPARISON:  02/29/2016 FINDINGS: Two sternotomy wires overlies normal cardiac silhouette. Bilateral pleural effusions greater on the LEFT. No pneumothorax. Central venous congestion. IMPRESSION: 1. No significant change. 2. Central venous congestion and bilateral pleural effusion. Moderate to large effusion on the LEFT. Electronically Signed   By: Genevive BiStewart  Edmunds M.D.   On: 03/01/2016 12:07   [place] Thoracentesis Asp Pleural Space W/img Guide  Result Date: 03/02/2016 INDICATION: Shortness of breath. Chronic kidney disease stage 4. Large left pleural effusion. Request for therapeutic thoracentesis. EXAM: ULTRASOUND GUIDED LEFT THORACENTESIS MEDICATIONS: 1% Lidocaine. COMPLICATIONS: None immediate. PROCEDURE: An ultrasound guided thoracentesis was thoroughly discussed with the patient and questions answered. The benefits, risks, alternatives and complications were also discussed. The patient understands and wishes to proceed with the procedure. Written consent was obtained. Ultrasound was performed to localize and mark an adequate pocket of fluid in the left chest. The area was then prepped and draped in the normal sterile fashion. 1% Lidocaine was used for local anesthesia. Under ultrasound guidance a 6 Fr Safe-T-Centesis catheter was introduced. Thoracentesis was performed. The catheter was removed  and a dressing applied. FINDINGS: A total of approximately 1.7 liters of clear light orange fluid was removed. IMPRESSION: Successful ultrasound guided left thoracentesis yielding 1.7 liters of pleural fluid. Read by:  Corrin ParkerWendy Blair, PA-C Electronically Signed   By: Corlis Leak  Hassell M.D.   On: 03/02/2016 16:10    Labs:  CBC:  Recent Labs  03/01/16 0151 03/02/16 0342 03/03/16 0521  WBC 7.1 5.9 5.5  HGB 9.6* 8.3* 9.6*  HCT 30.8* 26.8* 30.2*  PLT 182 162 171    COAGS:  Recent Labs  03/01/16 0151  INR 1.52    BMP:  Recent Labs  03/01/16 0151 03/02/16 0342 03/03/16 0521  NA 140 141 141  K 3.5 3.2* 3.2*  CL 103 108 106  CO2 23 24 25   GLUCOSE 116* 72 92  BUN 52* 50* 50*  CALCIUM 7.7* 7.7* 7.9*  CREATININE 4.20* 3.92* 4.02*  GFRNONAA 12* 13* 12*  GFRAA 13* 15* 14*    LIVER FUNCTION TESTS:  Recent Labs  03/01/16 0151 03/02/16 0342 03/03/16 0521  BILITOT 0.8  --   --   AST 29  --   --   ALT 18  --   --   ALKPHOS 88  --   --   PROT 5.6*  --   --   ALBUMIN 2.1* 1.8* 1.8*    TUMOR MARKERS: No results for input(s): AFPTM, CEA, CA199, CHROMGRNA in the last 8760 hours.  Assessment and Plan:  Left chronic pleural effusion Left pneumothorax post thoracentesis 9/7 Plan for Left chest tube placement Risks and Benefits discussed with the patient including bleeding, infection, damage to adjacent structures, bowel perforation/fistula connection, and sepsis. All of the patient's questions were answered, patient is agreeable to proceed. Consent signed and in chart.  Thank you for this interesting consult.  I greatly enjoyed meeting Cara Aguino and look forward to participating in their care.  A copy of this report was sent to the requesting provider on this date.  Electronically Signed: Lizette Pazos A 03/03/2016, 11:21 AM   I spent a total of 40 Minutes    in face to face in clinical consultation, greater than 50% of which was counseling/coordinating care for left  chest tube placement

## 2016-03-03 NOTE — Telephone Encounter (Signed)
Attempt to return call x 2-invalid number, # has been disconnected.

## 2016-03-03 NOTE — Progress Notes (Signed)
PROGRESS NOTE  [name] [name] XBM:[id number] DOB: 1928/02/16 DOA: 03/01/2016 PCP: Lindwood Qua, MD  HPI/Recap of past 24 hours:   Had pneumothorax after thoracentesis on 9/7, repeat cxr enlarging pneumothorax with mediastinal shift, currently on 2liter oxygen, bp low normal, patient seems drowsy, wife at bedside   Assessment/Plan: Principal Problem:   Acute diastolic (congestive) heart failure (HCC) Active Problems:   Essential hypertension   Encounter for palliative care   Goals of care, counseling/discussion   Chronic kidney disease (CKD), stage IV (severe) (HCC)   Acute confusional state   Acute heart disease   Pressure ulcer   NSTEMI (non-ST elevated myocardial infarction) (HCC)   Type II diabetes mellitus (HCC)   Malnutrition (HCC)   C. difficile colitis   Anemia    Pneumothorax:  I have discussed with case with Interventional radiology Dr Maryann Alar,  Consensus to proceed with thoracic surgery consult. Thoracic surgery recommended IR for chest tube placement Critical care also consulted.  Acute diastolic heart failure in the setting of NSTEMI, afib with RVR and protein malnutrition.   -Echo with normal lvef, not able to evaluate diastolic function due to afib - Evaluated by the heart failure team who recommend he is at end-of-life and not candidate for advanced therapies Likely he'll live 3-6 months. Did recommend diuresing and trying to improve his nutrition and strength as well as palliative care consult -cInitially he was on a Lasix drip, now iv lasix, -continue BB -cardiology signed off on 9/8  NSTEMI. Patient was admitted to cardiology service, TRH take over on 9/7. Cardiology H&P notes indicate NSTEMI likely type 2 event in setting of HF and afib with RVR. EKG with afib and HB. Troponin stable.  Patient denies pain. Patient started on a heparin drip on admission. Chart review indicates intermittent episodes of 10-20 beat runs of Vtach.  -cardiology recommended  palliative care consult and signed off.   Acute on chronic kidney disease stage III to 4. Creatinine 3.92 trending down from 4.2 on admission. This appears to be closer to his baseline. Evaluated by nephrology who opined current function not far from his baseline per his note. Note also indicates he is a poor candidate for hemodialysis and recommend palliative care consult, nephrology signed off.   Acute vs subacute confusional state. Likely related to all above.   -B12 and folate and rpr wnl -if fails to continue to improve consider imaging -Advance diet per speech therapy recommendation, continue aspiration precaution.  Anemia. Likely of chronic disease/mlanutrition-+/-chronic GI loss.  No signs of obvious bleeding. -fobt pending -anemia panel b12/folate/iron unremarkable --monitor -consider transfusion when indicated  C. difficile. Patient in the hospital for the entire month of August for same. Was discharged to rehabilitation on oral bank. Ears stable. -Continue by mouth Vanco -precautions  Severe protein malnutrition/ deconditioning. Patient was in a SNF prior to this admission. Chart review indicates patient has had slow decline since 2015 with issues pertaining to mount nutrition and worsening chronic kidney disease. However was reported that he participated in physical therapy at his recent SNF Speech therapy evaluation today. Of note heart failure team opines edema more likely related to low protein versus #1. Albumin 2.1 of note Palliative Care consulted and note indicates family with realistic expectations. -Nutritional consult -Physical therapy consult -Advance diet per recommendations of speech therapy   Hypertension. History of same. Blood pressure quite soft but improving during this hospitalization. Medications include metoprolol Lasix. -Continue Lasix drip as noted above -Beta blocker per cardiology -Monitor  A.  fib with RVR.  [place] vasc score 5. On admission  patient started on heparin drip and metoprolol. EKG today with sinus tachycardia with 1st degree AV block and Right BBB and left anterior fasicular block.  Heart rate 101 -he was started on heparin drip since admission, heparin d/ced by cardiology on 9/7 -continue beta blocker -cardiology signed off, palliative care consult pending   DVT prophylaxis: was on heparin, now on lovenox Code Status: full. Several providers and Palliative care have discussed poor prognosis with family. Family wish for patient to remain full code Family Communication: wife at bedside Consultants:   Nephrology coladonato ,signed off  Cardiology nishan, signed off  Thoracic surgery, signed off  Interventional radiology  Palliative care ( consulted on 9/6, requested reconsult on 9/8)  Critical care consulted on 9/8 due to patient is at risk of respiratory failure and hemodynamic instability and family insists on being full code.  Procedures:  thoracentesis, chest tube placement  Antimicrobials: oral vancomycin per pharmacy    Objective: BP 111/75 (BP Location: Left Arm)   Pulse (!) 126   Temp 98 F (36.7 C) (Axillary)   Resp 16   Ht 5\' 9"  (1.753 m)   Wt 70.5 kg (155 lb 6.4 oz)   SpO2 95%   BMI 22.95 kg/m   Intake/Output Summary (Last 24 hours) at 03/03/16 1009 Last data filed at 03/03/16 0914  Gross per 24 hour  Intake              488 ml  Output             1500 ml  Net            -1012 ml   Filed Weights   03/01/16 0429 03/02/16 0430 03/03/16 0500  Weight: 78.9 kg (173 lb 14.4 oz) 76.7 kg (169 lb) 70.5 kg (155 lb 6.4 oz)    Exam:   General:  Chronically ill, very frail  Cardiovascular: IRRR  Respiratory: diminished on the left, good air movement on the right  Abdomen: Soft/ND/NT, positive BS  Musculoskeletal: 3+pitting Edema bilateral lower extremities  Neuro: drowsy, not oriented to time or place  Data Reviewed: Basic Metabolic Panel:  Recent Labs Lab 03/01/16 0151  03/02/16 0342 03/03/16 0521  NA 140 141 141  K 3.5 3.2* 3.2*  CL 103 108 106  CO2 23 24 25   GLUCOSE 116* 72 92  BUN 52* 50* 50*  CREATININE 4.20* 3.92* 4.02*  CALCIUM 7.7* 7.7* 7.9*  MG  --  1.8  --   PHOS  --  4.8* 5.0*   Liver Function Tests:  Recent Labs Lab 03/01/16 0151 03/02/16 0342 03/03/16 0521  AST 29  --   --   ALT 18  --   --   ALKPHOS 88  --   --   BILITOT 0.8  --   --   PROT 5.6*  --   --   ALBUMIN 2.1* 1.8* 1.8*   No results for input(s): LIPASE, AMYLASE in the last 168 hours.  Recent Labs Lab 03/01/16 1035  AMMONIA 23   CBC:  Recent Labs Lab 03/01/16 0151 03/02/16 0342 03/03/16 0521  WBC 7.1 5.9 5.5  NEUTROABS 4.9  --   --   HGB 9.6* 8.3* 9.6*  HCT 30.8* 26.8* 30.2*  MCV 93.3 93.7 93.2  PLT 182 162 171   Cardiac Enzymes:    Recent Labs Lab 03/01/16 0151 03/01/16 0754 03/01/16 1239  TROPONINI 0.15* 0.15* 0.15*   BNP (  last 3 results) No results for input(s): BNP in the last 8760 hours.  ProBNP (last 3 results) No results for input(s): PROBNP in the last 8760 hours.  CBG: No results for input(s): GLUCAP in the last 168 hours.  Recent Results (from the past 240 hour(s))  MRSA PCR Screening     Status: None   Collection Time: 03/01/16  1:00 AM  Result Value Ref Range Status   MRSA by PCR NEGATIVE NEGATIVE Final    Comment:        The GeneXpert MRSA Assay (FDA approved for NASAL specimens only), is one component of a comprehensive MRSA colonization surveillance program. It is not intended to diagnose MRSA infection nor to guide or monitor treatment for MRSA infections.      Studies: Dg Chest 1 View  Result Date: 03/02/2016 CLINICAL DATA:  Left side pleural effusion. Post Thorocentesis on the left. Hx of CAD, HTN, NSTEMI, Pneumonia, DM2 EXAM: CHEST  1 VIEW COMPARISON:  Earlier film of the same day FINDINGS: Moderate left lateral and apical pneumothorax, without midline shift. Resolution of pleural effusion. Degree of left  lung inflation is slightly improved from prior exam. Probable layering right pleural effusion as before with stable opacities in the mid and lower right lung. Heart size upper limits normal.  Previous CABG. IMPRESSION: 1. Moderate left pleural effusion post thoracentesis, with no significant residual effusion, and overall slightly improved aeration of the left lung. Electronically Signed   By: Corlis Leak  Hassell M.D.   On: 03/02/2016 16:30   Dg Chest 2 View  Result Date: 03/02/2016 CLINICAL DATA:  Left-sided pleural effusion EXAM: CHEST  2 VIEW COMPARISON:  03/01/2016 FINDINGS: Cardiac shadow is stable. Postoperative changes are again seen. A large left pleural effusion is again noted. It is at least partially mobile. The small right effusion also appears somewhat mobile. IMPRESSION: Bilateral pleural effusions left greater than right with mobile components bilaterally. Degree of central vascular congestion appears somewhat worse than that seen on the prior exam. Electronically Signed   By: Alcide CleverMark  Lukens M.D.   On: 03/02/2016 12:59   Dg Chest Port 1 View  Result Date: 03/03/2016 CLINICAL DATA:  Short of breath EXAM: PORTABLE CHEST 1 VIEW COMPARISON:  03/02/2016 FINDINGS: Sternotomy wires overlie normal cardiac silhouette. Large LEFT pneumothorax again demonstrated. The LEFT upper pleural edge measures 48 mm mid chest wall increased from 27 mm. The lateral pleural edge measures 47 mm compared to 44 remeasured. Minimal expansion of the LEFT lung. Interval increase in LEFT effusion compared to 1 day prior. No clear expansion of the LEFT lung. IMPRESSION: 1. Large LEFT pneumothorax measures slightly larger than comparison exam. Note mediastinal shift. 2. No expansion of the LEFT lung. 3. Interval reaccumulation of LEFT pleural fluid. 4. Stable RIGHT effusion These results will be called to the ordering clinician or representative by the Radiologist Assistant, and communication documented in the PACS or zVision Dashboard.  Electronically Signed   By: Genevive BiStewart  Edmunds M.D.   On: 03/03/2016 08:59   Dg Chest Port 1 View  Result Date: 03/02/2016 CLINICAL DATA:  Follow-up pneumothorax EXAM: PORTABLE CHEST 1 VIEW COMPARISON:  9/7/ 17 at 16:25 FINDINGS: Cardiomediastinal silhouette is stable. Status post CABG again noted. Again noted moderate left upper and left lateral pneumothorax with atelectasis in left midlung in left lower lobe. The lateral component measures 55 mm thickness with slight progression from prior exam when measures 50 mm thickness. Stable right pleural effusion with right basilar atelectasis. IMPRESSION: Again noted moderate  left upper and left lateral pneumothorax with atelectasis in left midlung in left lower lobe. The lateral component measures 55 mm thickness with slight progression from prior exam when measured 50 mm thickness. Stable right pleural effusion with right basilar atelectasis. Electronically Signed   By: Natasha Mead M.D.   On: 03/02/2016 18:36   US Thoracentesis Asp Pleural Space W/img Guide  Result Date: 03/02/2016 INDICATION: Shortness of breath. Chronic kidney disease stage 4. Large left pleural effusion. Request for therapeutic thoracentesis. EXAM: ULTRASOUND GUIDED LEFT THORACENTESIS MEDICATIONS: 1% Lidocaine. COMPLICATIONS: None immediate. PROCEDURE: An ultrasound guided thoracentesis was thoroughly discussed with the patient and questions answered. The benefits, risks, alternatives and complications were also discussed. The patient understands and wishes to proceed with the procedure. Written consent was obtained. Ultrasound was performed to localize and mark an adequate pocket of fluid in the left chest. The area was then prepped and draped in the normal sterile fashion. 1% Lidocaine was used for local anesthesia. Under ultrasound guidance a 6 Fr Safe-T-Centesis catheter was introduced. Thoracentesis was performed. The catheter was removed and a dressing applied. FINDINGS: A total of  approximately 1.7 liters of clear light orange fluid was removed. IMPRESSION: Successful ultrasound guided left thoracentesis yielding 1.7 liters of pleural fluid. Read by:  Corrin Parker, PA-C Electronically Signed   By: Corlis Leak M.D.   On: 03/02/2016 16:10    Scheduled Meds: . acidophilus  1 capsule Oral Daily  . aspirin EC  81 mg Oral Daily  . cholecalciferol  1,000 Units Oral Daily  . enoxaparin (LOVENOX) injection  30 mg Subcutaneous Q24H  . furosemide  120 mg Intravenous BID  . mouth rinse  15 mL Mouth Rinse BID  . metoprolol  50 mg Oral Daily  . potassium chloride  40 mEq Oral BID  . sodium bicarbonate  325 mg Oral Daily  . sodium chloride flush  3 mL Intravenous Q12H  . vancomycin  125 mg Oral TID AC & HS   Followed by  . [START ON 03/06/2016] vancomycin  125 mg Oral BID   Followed by  . [START ON 03/11/2016] vancomycin  125 mg Oral Daily   Followed by  . [START ON 03/16/2016] vancomycin  125 mg Oral Q48H  . vancomycin  250 mg Oral Once  . vitamin C  500 mg Oral Daily    Continuous Infusions:    Time spent:  Leesa Leifheit MD, PhD  [facility] Pager [phone]. If 7PM-7AM, please contact night-coverage at [email or web address], password [facility] 03/03/2016, 10:09 AM  LOS: 2 days

## 2016-03-03 NOTE — Progress Notes (Signed)
Called X-ray to confirm that someone was coming o get patients 0600 chest xray. They confirmed that the xray tech was making her rounds and would be up there shortly.

## 2016-03-03 NOTE — Progress Notes (Signed)
      [address]       [place] [id number]             [phone]      CTSP for left pneumothorax  82 yo man with multiple medical issues including CAD, hx of CABG (BKB), atrial fib, CHF, stage IV CKD, c difficile colitis, severe protein calorie malnutrition and left pleural effusion. He has had a left effusion dating back to at least April of this year (no CXR prior to that). Admitted yesterday with altered mental status. He has been bed bound for over a month.  Yesterday he had left thoracentesis. 1.7 liters of fluid drained. No change symptomatically after drainage. Post drainage xray showed a large "pneumothorax", likely a space. This AM a follow up CXR showed a larger space/ pneumo.   He denies CP or SOB. C/o " my butt hurts."  BP 111/75 (BP Location: Left Arm)   Pulse (!) 126   Temp 98 F (36.7 C) (Axillary)   Resp 16   Ht 5\' 9"  (1.753 m)   Wt 155 lb 6.4 oz (70.5 kg)   SpO2 95%   BMI 22.95 kg/m  Alert and answers questions Right lung clear. Absent BS on left. Trachea midline, no subcutaneous air.  PORTABLE CHEST 1 VIEW  COMPARISON:  03/02/2016  FINDINGS: Sternotomy wires overlie normal cardiac silhouette. Large LEFT pneumothorax again demonstrated. The LEFT upper pleural edge measures 48 mm mid chest wall increased from 27 mm. The lateral pleural edge measures 47 mm compared to 44 remeasured. Minimal expansion of the LEFT lung. Interval increase in LEFT effusion compared to 1 day prior. No clear expansion of the LEFT lung.  IMPRESSION: 1. Large LEFT pneumothorax measures slightly larger than comparison exam. Note mediastinal shift. 2. No expansion of the LEFT lung. 3. Interval reaccumulation of LEFT pleural fluid. 4. Stable RIGHT effusion These results will be called to the ordering clinician or representative by the Radiologist Assistant, and communication documented in the PACS or zVision Dashboard.   Electronically Signed   By:  Genevive BiStewart  Edmunds M.D.   On: 03/03/2016 08:59   IMPRESSION:   [name] [name] is an 82 yo man with multiple medical issues including a chronic left effusion. He had thoracentesis yesterday which drained 1.7 L of fluid but the lung did not reexpand. Today's film shows a slightly larger space although some of the difference may be due to angle.  I  Think the safest option is to have IR place a pigtail catheter just in case there is an actual air leak, although I suspect this is just a trapped lung from a chronic effusion. I doubt the lung will completely reexpand.   He is not a candidate for surgical decortication.  I spoke with Dr. Loreta AveWagner from IR- he will arrange pigtail placement  [name], MD [facility] [phone]

## 2016-03-03 NOTE — Care Management Note (Addendum)
Case Management Note  Patient Details  Name: [name] [name] MRN: [id number] Date of Birth: 02-24-1928  Subjective/Objective:  Pt presented for AMS with Fluid volume overload. [facility]. Wife at bedside. Palliative Care consulted with pt.                   Action/Plan: CM will continue to monitor for disposition needs.   Expected Discharge Date:                  Expected Discharge Plan:  Skilled Nursing Facility  In-House Referral:  Clinical Social Work  Discharge planning Services  CM Consult  Post Acute Care Choice:   Long Term Acute Care (LTAC) Choice offered to:   Patient, Adult Children  DME Arranged:   N/A DME Agency:   N/A  HH Arranged:   N/A HH Agency:   N/A  Status of Service:  Completed.  If discussed at Long Length of Stay Meetings, dates discussed:  03/18/2016  Additional Comments: 1644  03/09/2016 Tomi BambergerBrenda Graves-Bigelow, RN,BSN [phone] CM was able to speak with son Loraine LericheMark to coordinate a family meeting. Scheduled for 3:30 pm. CM did discuss the plan for LTAC- Choice Provided-Pt and son Agreeable to [facility]. CM did make referral with [facility] Liaison. Tour of [facility] was provided to patient. Son felt like LTAC would be appropriate for the  Patients goals. MD in agreement LTAC and will place d/c order and summary. Plan for patient to transition to [facility] this evening. No further needs from CM at this time.   1210 03/06/2016 Tomi BambergerBrenda Graves-Bigelow, RN,BSN [phone] CM did send ltac referral to both agencies [facility]. [facility] liaisons [facility] Chest tube pulled today. CM did make several calls to son and no answer. Awaiting call back. CM did discuss Ltac with son on 03-06-16. He stated he would have to know more about the facility. CM did leave Loraine LericheMark  detailed information about Ltac in the voice mail. Palliative has stated that family wants aggressive care at this time. Son Loraine LericheMark voiced concerns that he wants to see his father like  he was in the SNF- able to work with PT and he was able to eat. CM will continue to follow the case.    1159 03-06-16 Tomi BambergerBrenda Graves-Bigelow, RN,BSN [phone]  CM did finally reach son Loraine LericheMark. Loraine LericheMark had questions in regards to care that only MD would be able to answer. He wanted to know how long CT would be in and how long treatment would be for the c diff. CM did ask for family meeting and Loraine LericheMark stated he would need to speak with MD first. CM did place call to MD Roda ShuttersXu and she wants to meet in person to discuss plan of care. Son was agreeable to consult Ltac to see if insurance will cover. He asked that CM to text him with MD information so he could contact her. CM to text Loraine LericheMark that MD wants to meet in person.    1137 03-06-16 Tomi BambergerBrenda Graves-Bigelow, RN,BSN [phone] CM did hear back from PA and he stated to send Ltac referral out. CM will continue to monitor.   1108 03-06-16 Tomi BambergerBrenda Graves-Bigelow, RN,BSN [phone] Important Message left in the room. CM did try to reach out to wife in regards to message and pt's plan of care. Per MD Roda ShuttersXu she had tried over the weekend and was not able to reach any family. Pt continues on IV Lasix, IV Cardizem and IV  Heparin gtt. Chest Tube in place. Pt/ family was consulted by Palliative Care Meeting- per notes family wants to be aggressive at this time. Pt continues to be a full code.  Per MD pt has poor po intake and would benefit from a peg tube/Ltach which was discussed in the progression meeting. CM did reach out to physician advisor. CM will continue to monitor.  Gala Lewandowsky, RN 03/03/2016, 2:10 PM

## 2016-03-03 NOTE — Progress Notes (Addendum)
Chest xray result in, worsening pneumo, mediastinal shift, and effusion. Pt sating 97% on 2L. K 3.2, Dr. Roda ShuttersXu made aware. Will call radiology to relay results. Astrid DivinePam Turpan paged. Awaiting call back

## 2016-03-03 NOTE — Telephone Encounter (Signed)
Please call,she wants Dr Grimesland LionsHochrien to know what is going on with [name] [name]. [facility]. She would love to talk to the nurse.

## 2016-03-03 NOTE — Progress Notes (Signed)
Pt bp 90/60, HR 96 somnolent, Given 1 tab norco 5-325 for sacral pain, no pain at this time. MD made aware. Will continue to monitor.

## 2016-03-04 LAB — HEPATIC FUNCTION PANEL
ALBUMIN: 1.8 g/dL — AB (ref 3.5–5.0)
ALT: 14 U/L — ABNORMAL LOW (ref 17–63)
AST: 20 U/L (ref 15–41)
Alkaline Phosphatase: 78 U/L (ref 38–126)
BILIRUBIN DIRECT: 0.2 mg/dL (ref 0.1–0.5)
Indirect Bilirubin: 0.5 mg/dL (ref 0.3–0.9)
Total Bilirubin: 0.7 mg/dL (ref 0.3–1.2)
Total Protein: 4.8 g/dL — ABNORMAL LOW (ref 6.5–8.1)

## 2016-03-04 LAB — CBC
HCT: 30.4 % — ABNORMAL LOW (ref 39.0–52.0)
Hemoglobin: 9.4 g/dL — ABNORMAL LOW (ref 13.0–17.0)
MCH: 29.6 pg (ref 26.0–34.0)
MCHC: 30.9 g/dL (ref 30.0–36.0)
MCV: 95.6 fL (ref 78.0–100.0)
PLATELETS: 164 10*3/uL (ref 150–400)
RBC: 3.18 MIL/uL — ABNORMAL LOW (ref 4.22–5.81)
RDW: 16.3 % — AB (ref 11.5–15.5)
WBC: 8 10*3/uL (ref 4.0–10.5)

## 2016-03-04 LAB — RENAL FUNCTION PANEL
ALBUMIN: 1.8 g/dL — AB (ref 3.5–5.0)
Anion gap: 5 (ref 5–15)
BUN: 51 mg/dL — AB (ref 6–20)
CALCIUM: 8 mg/dL — AB (ref 8.9–10.3)
CO2: 29 mmol/L (ref 22–32)
CREATININE: 4.08 mg/dL — AB (ref 0.61–1.24)
Chloride: 107 mmol/L (ref 101–111)
GFR calc Af Amer: 14 mL/min — ABNORMAL LOW (ref >60)
GFR, EST NON AFRICAN AMERICAN: 12 mL/min — AB (ref >60)
Glucose, Bld: 150 mg/dL — ABNORMAL HIGH (ref 65–99)
PHOSPHORUS: 5.5 mg/dL — AB (ref 2.5–4.6)
Potassium: 4.1 mmol/L (ref 3.5–5.1)
SODIUM: 141 mmol/L (ref 135–145)

## 2016-03-04 LAB — LIPASE, BLOOD: Lipase: 27 U/L (ref 11–51)

## 2016-03-04 MED ORDER — METOPROLOL TARTRATE 12.5 MG HALF TABLET
12.5000 mg | ORAL_TABLET | Freq: Two times a day (BID) | ORAL | Status: DC
  Administered 2016-03-04 – 2016-03-07 (×6): 12.5 mg via ORAL
  Filled 2016-03-04 (×6): qty 1

## 2016-03-04 NOTE — Progress Notes (Addendum)
Palliative medicine has been reviewing chart since initial consult on 03-01-16.   Discussed with Dr Roda ShuttersXu this am. CCM recommendations noted, Dr Roda ShuttersXu requests repeat palliative intervention given patient's ongoing decline.   Call placed and unable to reach son Clyda GreenerMark Smalling this am. He has our card and knows how to reach palliative service. We remain available to meet again for a family meeting for further discussions if the family decides on a more palliative based approach to the patient's care. Appreciate chaplain follow up.   Rosalin HawkingZeba Rosslyn Pasion MD [facility] palliative [facility] team [phone]

## 2016-03-04 NOTE — Progress Notes (Signed)
Referring Physician(s): Dr Andrey SpearmanSteve Hendrickson  Supervising Physician: Gilmer MorWagner, Jaime  Patient Status:  Inpatient  Chief Complaint:  Left chest tube placement 9/8 Ptx post thoracentesis 9/7  Subjective:  Asymptomatic Says he is comfortable Denies SOB or pain Chest tube in place ++air leak noted 800 cc blood tinged fluid in Pleurvac  Allergies: Statins  Medications: Prior to Admission medications   Medication Sig Start Date End Date Taking? Authorizing Provider  acetaminophen (TYLENOL) 650 MG CR tablet Take 650 mg by mouth every 4 (four) hours as needed for pain.   Yes Historical Provider, MD  escitalopram (LEXAPRO) 10 MG tablet Take 10 mg by mouth daily.  11/18/15  Yes Historical Provider, MD  furosemide (LASIX) 40 MG tablet Take 40 mg by mouth daily as needed for fluid.  10/20/15 10/19/16 Yes Historical Provider, MD  metoprolol succinate (TOPROL-XL) 50 MG 24 hr tablet Take 50 mg by mouth daily. Take with or immediately following a meal.   Yes Historical Provider, MD  mirtazapine (REMERON) 15 MG tablet Take 15 mg by mouth at bedtime.   Yes Historical Provider, MD  Multiple Vitamins-Minerals (EYE VITAMINS) CAPS Take 1 capsule by mouth daily.    Yes Historical Provider, MD  multivitamin (RENA-VIT) TABS tablet Take 1 tablet by mouth daily.   Yes Historical Provider, MD  Probiotic Product (ALIGN) 4 MG CAPS Take 4 mg by mouth daily.   Yes Historical Provider, MD  risperiDONE (RISPERDAL) 0.25 MG tablet Take 0.25 mg by mouth at bedtime.   Yes Historical Provider, MD  sodium bicarbonate 650 MG tablet Take 1,300 mg by mouth 2 (two) times daily.   Yes Historical Provider, MD  vancomycin (VANCOCIN) 250 MG capsule Take 500 mg by mouth every 6 (six) hours. 02/26/16  Yes Historical Provider, MD  NITROSTAT 0.4 MG SL tablet DISSOLVE ONE TABLET UNDER THE TONGUE EVERY 5 MINUTES AS NEEDED FOR CHEST PAIN.  DO NOT EXCEED A TOTAL OF 3 DOSES IN 15 MINUTES Patient taking differently: Place 0.4 mg under  the tongue every 5 (five) minutes as needed for chest pain (Do not exceed a total of 3 doses in 15 minutes).  11/14/13   Rollene RotundaJames Hochrein, MD     Vital Signs: BP 114/72   Pulse (!) 102   Temp 98.2 F (36.8 C) (Axillary)   Resp 11   Ht 5\' 9"  (1.753 m)   Wt 150 lb (68 kg)   SpO2 98%   BMI 22.15 kg/m   Physical Exam  Pulmonary/Chest: Effort normal. No respiratory distress. He has wheezes.  Neurological: He is alert.  Skin: Skin is warm and dry.  Left chest tube site clean and dry NT No bleeding 800 cc blood tinged fluid in Pleurvac ++ air leak  Nursing note and vitals reviewed.   Imaging: Dg Chest 1 View  Result Date: 03/02/2016 CLINICAL DATA:  Left side pleural effusion. Post Thorocentesis on the left. Hx of CAD, HTN, NSTEMI, Pneumonia, DM2 EXAM: CHEST  1 VIEW COMPARISON:  Earlier film of the same day FINDINGS: Moderate left lateral and apical pneumothorax, without midline shift. Resolution of pleural effusion. Degree of left lung inflation is slightly improved from prior exam. Probable layering right pleural effusion as before with stable opacities in the mid and lower right lung. Heart size upper limits normal.  Previous CABG. IMPRESSION: 1. Moderate left pleural effusion post thoracentesis, with no significant residual effusion, and overall slightly improved aeration of the left lung. Electronically Signed   By: Corlis Leak  Hassell  M.D.   On: 03/02/2016 16:30   Dg Chest 2 View  Result Date: 03/02/2016 CLINICAL DATA:  Left-sided pleural effusion EXAM: CHEST  2 VIEW COMPARISON:  03/01/2016 FINDINGS: Cardiac shadow is stable. Postoperative changes are again seen. A large left pleural effusion is again noted. It is at least partially mobile. The small right effusion also appears somewhat mobile. IMPRESSION: Bilateral pleural effusions left greater than right with mobile components bilaterally. Degree of central vascular congestion appears somewhat worse than that seen on the prior exam.  Electronically Signed   By: Alcide Clever M.D.   On: 03/02/2016 12:59   Ir Chest Fluoro  Result Date: 03/03/2016 INDICATION: 82 year old male with a history of pneumothorax EXAM: CHEST FLUOROSCOPY MEDICATIONS: The patient is currently admitted to the hospital and receiving intravenous antibiotics. The antibiotics were administered within an appropriate time frame prior to the initiation of the procedure. ANESTHESIA/SEDATION: Fentanyl 25 mcg IV; Versed 0 mg IV COMPLICATIONS: None PROCEDURE: Informed written consent was obtained from the patient after a thorough discussion of the procedural risks, benefits and alternatives. All questions were addressed. Maximal Sterile Barrier Technique was utilized including caps, mask, sterile gowns, sterile gloves, sterile drape, hand hygiene and skin antiseptic. A timeout was performed prior to the initiation of the procedure. Patient positioned supine position on the fluoroscopy table. Left chest was prepped and draped in the usual sterile fashion. Scout image was acquired. The skin and subcutaneous tissues at the anterior axillary line just below the nipple was generously infiltrated 1% lidocaine for local anesthesia. A Yueh needle was advanced under aspiration into the left chest. Catheter was advanced from the needle. A 035 wire was placed through the needle into the chest under fluoroscopy. The catheter was removed. Small incision was made on the wire. It is dilation of the soft tissues was performed and then a 10 French drain was placed with modified Seldinger technique. Pigtail was formed can the drain was sutured in position. Final image was stored. The catheter was then attached to a water seal chamber on suction. Patient tolerated the procedure well and remained hemodynamically stable throughout. No complications were encountered and no significant blood loss encountered. IMPRESSION: Status post left thoracostomy tube placement. Signed, Yvone Neu. Loreta Ave, DO [facility] Electronically Signed   By: Gilmer Mor D.O.   On: 03/03/2016 15:21   Dg Chest Port 1 View  Result Date: 03/03/2016 CLINICAL DATA:  Follow-up left pneumothorax EXAM: PORTABLE CHEST 1 VIEW COMPARISON:  Chest radiograph from earlier today. FINDINGS: Interval placement of left basilar chest tube with the distal pigtail portion overlying the medial basilar left pleural space. Sternotomy wires appear aligned and intact. CABG clips overlie the mediastinum. Stable cardiomediastinal silhouette with mild cardiomegaly. Moderate left pneumothorax is decreased. No right pneumothorax. No mediastinal shift. Small right pleural effusion, increased. No left pleural effusion. Stable mild pulmonary edema. Improved aeration in the left lung with persistent patchy opacity in the mid to lower left lung. IMPRESSION: 1. Persistent moderate left pneumothorax, decreased. No mediastinal shift. 2. Improved aeration of the left lung with persistent nonspecific patchy opacity in the mid to lower left lung. 3. Small right pleural effusion, increased. 4. Stable mild congestive heart failure. Electronically Signed   By: Delbert Phenix M.D.   On: 03/03/2016 16:44   Dg Chest Port 1 View  Result Date: 03/03/2016 CLINICAL DATA:  Short of breath EXAM: PORTABLE CHEST 1 VIEW COMPARISON:  03/02/2016 FINDINGS: Sternotomy wires overlie normal cardiac silhouette.  Large LEFT pneumothorax again demonstrated. The LEFT upper pleural edge measures 48 mm mid chest wall increased from 27 mm. The lateral pleural edge measures 47 mm compared to 44 remeasured. Minimal expansion of the LEFT lung. Interval increase in LEFT effusion compared to 1 day prior. No clear expansion of the LEFT lung. IMPRESSION: 1. Large LEFT pneumothorax measures slightly larger than comparison exam. Note mediastinal shift. 2. No expansion of the LEFT lung. 3. Interval reaccumulation of LEFT pleural fluid. 4. Stable RIGHT effusion  These results will be called to the ordering clinician or representative by the Radiologist Assistant, and communication documented in the PACS or zVision Dashboard. Electronically Signed   By: Genevive Bi M.D.   On: 03/03/2016 08:59   Dg Chest Port 1 View  Result Date: 03/02/2016 CLINICAL DATA:  Follow-up pneumothorax EXAM: PORTABLE CHEST 1 VIEW COMPARISON:  9/7/ 17 at 16:25 FINDINGS: Cardiomediastinal silhouette is stable. Status post CABG again noted. Again noted moderate left upper and left lateral pneumothorax with atelectasis in left midlung in left lower lobe. The lateral component measures 55 mm thickness with slight progression from prior exam when measures 50 mm thickness. Stable right pleural effusion with right basilar atelectasis. IMPRESSION: Again noted moderate left upper and left lateral pneumothorax with atelectasis in left midlung in left lower lobe. The lateral component measures 55 mm thickness with slight progression from prior exam when measured 50 mm thickness. Stable right pleural effusion with right basilar atelectasis. Electronically Signed   By: Natasha Mead M.D.   On: 03/02/2016 18:36   Dg Chest Port 1 View  Result Date: 03/01/2016 CLINICAL DATA:  Short of breath EXAM: PORTABLE CHEST 1 VIEW COMPARISON:  02/29/2016 FINDINGS: Two sternotomy wires overlies normal cardiac silhouette. Bilateral pleural effusions greater on the LEFT. No pneumothorax. Central venous congestion. IMPRESSION: 1. No significant change. 2. Central venous congestion and bilateral pleural effusion. Moderate to large effusion on the LEFT. Electronically Signed   By: Genevive Bi M.D.   On: 03/01/2016 12:07   US Thoracentesis Asp Pleural Space W/img Guide  Result Date: 03/02/2016 INDICATION: Shortness of breath. Chronic kidney disease stage 4. Large left pleural effusion. Request for therapeutic thoracentesis. EXAM: ULTRASOUND GUIDED LEFT THORACENTESIS MEDICATIONS: 1% Lidocaine. COMPLICATIONS: None  immediate. PROCEDURE: An ultrasound guided thoracentesis was thoroughly discussed with the patient and questions answered. The benefits, risks, alternatives and complications were also discussed. The patient understands and wishes to proceed with the procedure. Written consent was obtained. Ultrasound was performed to localize and mark an adequate pocket of fluid in the left chest. The area was then prepped and draped in the normal sterile fashion. 1% Lidocaine was used for local anesthesia. Under ultrasound guidance a 6 Fr Safe-T-Centesis catheter was introduced. Thoracentesis was performed. The catheter was removed and a dressing applied. FINDINGS: A total of approximately 1.7 liters of clear light orange fluid was removed. IMPRESSION: Successful ultrasound guided left thoracentesis yielding 1.7 liters of pleural fluid. Read by:  Corrin Parker, PA-C Electronically Signed   By: Corlis Leak M.D.   On: 03/02/2016 16:10    Labs:  CBC:  Recent Labs  03/01/16 0151 03/02/16 0342 03/03/16 0521 03/04/16 0504  WBC 7.1 5.9 5.5 8.0  HGB 9.6* 8.3* 9.6* 9.4*  HCT 30.8* 26.8* 30.2* 30.4*  PLT 182 162 171 164    COAGS:  Recent Labs  03/01/16 0151  INR 1.52    BMP:  Recent Labs  03/01/16 0151 03/02/16 0342 03/03/16 0521 03/04/16 0504  NA 140 141  141 141  K 3.5 3.2* 3.2* 4.1  CL 103 108 106 107  CO2 23 24 25 29   GLUCOSE 116* 72 92 150*  BUN 52* 50* 50* 51*  CALCIUM 7.7* 7.7* 7.9* 8.0*  CREATININE 4.20* 3.92* 4.02* 4.08*  GFRNONAA 12* 13* 12* 12*  GFRAA 13* 15* 14* 14*    LIVER FUNCTION TESTS:  Recent Labs  03/01/16 0151 03/02/16 0342 03/03/16 0521 03/04/16 0504  BILITOT 0.8  --   --  0.7  AST 29  --   --  20  ALT 18  --   --  14*  ALKPHOS 88  --   --  78  PROT 5.6*  --   --  4.8*  ALBUMIN 2.1* 1.8* 1.8* 1.8*  1.8*    Assessment and Plan:  L chest tube in place ++ air leak Will follow output and air leak CXR for am Plan per TCTS  Electronically  Signed: Dareen Gutzwiller A 03/04/2016, 12:09 PM   I spent a total of 15 Minutes at the the patient's bedside AND on the patient's hospital floor or unit, greater than 50% of which was counseling/coordinating care for left chest tube placement

## 2016-03-04 NOTE — Progress Notes (Signed)
Speech Language Pathology Treatment: Dysphagia  Patient Details Name: [name] KeelsRichard [name] MRN: [id number] DOB: [birth year] Today's Date: 03/04/2016 Time: [id number] SLP Time Calculation (min) (ACUTE ONLY): 17 min  Assessment / Plan / Recommendation Clinical Impression  ST follow up at request of nursing due to reports of oral holding particularly when taking medications.  Chart review revealed that the patient has been afebrile, lungs have been clear but diminished and the patient's intake has been poor.  Patient is status post placement of a chest tube.  Most recent chest xray is showing moderate left pneumothorax, patchy non specific opacity mid to LLL and small right pleural effusion.  Meal observation was completed using the patient's lunch tray.  The patient was agreeable for intake but definitely did not want to eat a lot.  He was only willing to take a few bites of food and drink about 1/4 of his ensure.  The patient was noted to demonstrate extremely prolonged oral prep and mastication of chopped meats as well as oral residue given even soft solids (i.e. Noodles with gravy).  Cough was seen given thin liquids via straw sips.  Cough was not seen given one small sip at a time.  Recommend downgrade the patient's diet to dysphagia 1 with thin liquids.  The patient will require complete assistance for all intake.  The patient should not use straws and should be given one small sip at a time.  Meds should be given whole in puree unless issues are noted and then they should be crushed in purees.  ST will continue to follow for therapeutic diet tolerance and possible advancement of diet.     HPI HPI:  82 year old gentleman with a past medical history significant for stage IV chronic kidney disease, hypertension, dyslipidemia, diabetes mellitus, coronary artery disease status post CABG 3 in 1997, stents in 2010, history of bilateral inguinal hernia. Patient has had subacute-chronic decline essentially since 2015  with issues pertaining to malnutrition and worsening chronic kidney disease. More recently, patient was hospitalized at [facility] from August 2-09/06/2015 for Clostridium difficile infection, metabolic acidosis, pleural effusion, encephalopathy. The patient was deemed to have improved enough to be transitioned to short-term rehabilitation from [facility] on 02-25-16. It is reported that the patient seemed to have been participating in physical therapy there and was compliant with his medications there however developed confusion and was admitted overnight to [facility] on 03-01-16. Patient has been admitted with working diagnosis of diastolic congestive heart failure exacerbation, possible non-ST segment elevation MI, atrial fibrillation, altered mental status and fluid overload.      SLP Plan  Continue with current plan of care     Recommendations  Diet recommendations: Dysphagia 1 (puree);Thin liquid Liquids provided via: Cup;No straw Medication Administration: Whole meds with puree Supervision: Trained caregiver to feed patient Compensations: Minimize environmental distractions;Slow rate;Small sips/bites (check mouth for pocketing) Postural Changes and/or Swallow Maneuvers: Seated upright 90 degrees;Upright 30-60 min after meal            Oral Care Recommendations: Oral care BID Follow up Recommendations: 24 hour supervision/assistance;Skilled Nursing facility Plan: Continue with current plan of care     GO               Dimas AguasMelissa Marieta Markov, MA, CCC-SLP Acute Rehab SLP [phone] Fleet ContrasGoodman, Stran Raper N 03/04/2016, 2:39 PM

## 2016-03-04 NOTE — Progress Notes (Addendum)
Pt agitated, uncooperative, refusing Lovenox. States he is not in pain but is upset about yesterday, 'no one gave me pain medication, can you tell me why no one gave me pain medication". explained to pt that he did received pain medication for sacral and buttocks pain 8/8 throughout day, pt in denial. alert to self, disoriented to time and situation. Will continue to monitor. Pt states they are in no pain at this time. Pt wants to rest.  Dr. Roda ShuttersXu made aware.

## 2016-03-04 NOTE — Progress Notes (Addendum)
PROGRESS NOTE  [name] [name] ZOX:[id number] DOB: January 12, 1928 DOA: 03/01/2016 PCP: Lindwood Qua, MD  HPI/Recap of past 24 hours:  Chest tube in place, patient is very frail, c/o sacral pain, only oriented to person Remain in afib, bp low normal Seems to have less lower extremity edema  Assessment/Plan: Principal Problem:   Acute diastolic (congestive) heart failure (HCC) Active Problems:   Essential hypertension   Encounter for palliative care   Goals of care, counseling/discussion   Chronic kidney disease (CKD), stage IV (severe) (HCC)   Acute confusional state   Acute heart disease   Pressure ulcer   NSTEMI (non-ST elevated myocardial infarction) (HCC)   Type II diabetes mellitus (HCC)   Malnutrition (HCC)   C. difficile colitis   Anemia    Pneumothorax:  . Thoracic surgery recommended IR for chest tube placement -IR and Critical care input appreciated  Acute diastolic heart failure in the setting of NSTEMI, afib with RVR and protein malnutrition.   -Echo with normal lvef, not able to evaluate diastolic function due to afib - Evaluated by the heart failure team who recommend he is at end-of-life and not candidate for advanced therapies Likely he'll live 3-6 months. Did recommend diuresing and trying to improve his nutrition and strength as well as palliative care consult - he was on a Lasix drip, now iv lasix, -continue BB if bp allows -cardiology signed off on 9/8  NSTEMI. Patient was admitted to cardiology service, TRH take over on 9/7. Cardiology H&P notes indicate NSTEMI likely type 2 event in setting of HF and afib with RVR. EKG with afib and HB. Troponin stable.  Patient denies pain. Patient started on a heparin drip on admission. Chart review indicates intermittent episodes of 10-20 beat runs of Vtach.  -cardiology recommended palliative care consult and signed off.   A. fib with RVR.  [place] vasc score 5. On admission patient started on heparin drip and  metoprolol. EKG today with sinus tachycardia with 1st degree AV block and Right BBB and left anterior fasicular block.  Heart rate 101 -he was started on heparin drip since admission, heparin d/ced by cardiology on 9/7 -continue beta blocker if bp allows -cardiology signed off, palliative care consulted, family want to continue aggressive measures, will need to talk to cardiology about anticoagualtion, patient does has anemia    Acute on chronic kidney disease stage III to 4. Creatinine 3.92 trending down from 4.2 on admission. This appears to be closer to his baseline. Evaluated by nephrology who opined current function not far from his baseline per his note. Note also indicates he is a poor candidate for hemodialysis and recommend palliative care consult, nephrology signed off.   Acute vs subacute confusional state. Likely related to all above.   -B12 and folate and rpr wnl -if fails to continue to improve consider imaging -Advance diet per speech therapy recommendation, continue aspiration precaution.  Anemia. Likely of chronic disease/mlanutrition-+/-chronic GI loss.  No signs of obvious bleeding. -fobt pending -anemia panel b12/folate/iron unremarkable --monitor -consider transfusion when indicated  C. difficile. Patient in the hospital for the entire month of August for same. Was discharged to rehabilitation on oral bank. Ears stable. -Continue by mouth Vanco -precautions  Severe protein malnutrition/ deconditioning. Patient was in a SNF prior to this admission. Chart review indicates patient has had slow decline since 2015 with issues pertaining to mount nutrition and worsening chronic kidney disease. However was reported that he participated in physical therapy at his recent SNF Speech therapy  evaluation today. Of note heart failure team opines edema more likely related to low protein versus #1. Albumin 2.1 of note Palliative Care consulted and note indicates family with  realistic expectations. -Nutritional consult -Physical therapy consult -Advance diet per recommendations of speech therapy   Hypertension. History of same. Blood pressure quite soft but improving during this hospitalization. Medications include metoprolol Lasix. -Continue Lasix drip as noted above -Beta blocker per cardiology -Monitor    DVT prophylaxis: was on heparin, now on lovenox Code Status: full. Several providers and Palliative care have discussed poor prognosis with family. Family wish for patient to remain full code Family Communication: i spent more than on 9/9 to talk to patient's son mark and Jt in conference room, explained the over all poor prognosis, son insist on continue aggressive treatment he will make decision about alternative nutrition, I have encourage son to get in touch with  palliative team  to arrange family meeting  Consultants:   Nephrology coladonato ,signed off  Cardiology nishan, signed off  Thoracic surgery, signed off  Interventional radiology  Palliative care ( consulted on 9/6, requested reconsult on 9/8)  Critical care consulted on 9/8 due to patient is at risk of respiratory failure and hemodynamic instability and family insists on being full code.  Procedures:  thoracentesis, chest tube placement  Antimicrobials: oral vancomycin per pharmacy    Objective: BP 96/69   Pulse (!) 102   Temp 97.5 F (36.4 C) (Axillary)   Resp 13   Ht 5\' 9"  (1.753 m)   Wt 68 kg (150 lb)   SpO2 100%   BMI 22.15 kg/m   Intake/Output Summary (Last 24 hours) at 03/04/16 0946 Last data filed at 03/04/16 0901  Gross per 24 hour  Intake             1594 ml  Output             1550 ml  Net               44 ml   Filed Weights   03/02/16 0430 03/03/16 0500 03/04/16 0600  Weight: 76.7 kg (169 lb) 70.5 kg (155 lb 6.4 oz) 68 kg (150 lb)    Exam:   General:  Chronically ill, very frail  Cardiovascular: IRRR  Respiratory:  diminished on the left, good air movement on the right  Abdomen: Soft/ND/NT, positive BS  Musculoskeletal: pitting Edema bilateral lower extremities seems slowing improving  Neuro: drowsy, not oriented to time or place  Data Reviewed: Basic Metabolic Panel:  Recent Labs Lab 03/01/16 0151 03/02/16 0342 03/03/16 0521 03/04/16 0504  NA 140 141 141 141  K 3.5 3.2* 3.2* 4.1  CL 103 108 106 107  CO2 23 24 25 29   GLUCOSE 116* 72 92 150*  BUN 52* 50* 50* 51*  CREATININE 4.20* 3.92* 4.02* 4.08*  CALCIUM 7.7* 7.7* 7.9* 8.0*  MG  --  1.8  --   --   PHOS  --  4.8* 5.0* 5.5*   Liver Function Tests:  Recent Labs Lab 03/01/16 0151 03/02/16 0342 03/03/16 0521 03/04/16 0504  AST 29  --   --  20  ALT 18  --   --  14*  ALKPHOS 88  --   --  78  BILITOT 0.8  --   --  0.7  PROT 5.6*  --   --  4.8*  ALBUMIN 2.1* 1.8* 1.8* 1.8*  1.8*    Recent Labs Lab 03/04/16 1610  LIPASE 27    Recent Labs Lab 03/01/16 1035  AMMONIA 23   CBC:  Recent Labs Lab 03/01/16 0151 03/02/16 0342 03/03/16 0521 03/04/16 0504  WBC 7.1 5.9 5.5 8.0  NEUTROABS 4.9  --   --   --   HGB 9.6* 8.3* 9.6* 9.4*  HCT 30.8* 26.8* 30.2* 30.4*  MCV 93.3 93.7 93.2 95.6  PLT 182 162 171 164   Cardiac Enzymes:    Recent Labs Lab 03/01/16 0151 03/01/16 0754 03/01/16 1239  TROPONINI 0.15* 0.15* 0.15*   BNP (last 3 results) No results for input(s): BNP in the last 8760 hours.  ProBNP (last 3 results) No results for input(s): PROBNP in the last 8760 hours.  CBG: No results for input(s): GLUCAP in the last 168 hours.  Recent Results (from the past 240 hour(s))  MRSA PCR Screening     Status: None   Collection Time: 03/01/16  1:00 AM  Result Value Ref Range Status   MRSA by PCR NEGATIVE NEGATIVE Final    Comment:        The GeneXpert MRSA Assay (FDA approved for NASAL specimens only), is one component of a comprehensive MRSA colonization surveillance program. It is not intended to diagnose  MRSA infection nor to guide or monitor treatment for MRSA infections.      Studies: Ir Chest Fluoro  Result Date: 03/03/2016 INDICATION: 82 year old male with a history of pneumothorax EXAM: CHEST FLUOROSCOPY MEDICATIONS: The patient is currently admitted to the hospital and receiving intravenous antibiotics. The antibiotics were administered within an appropriate time frame prior to the initiation of the procedure. ANESTHESIA/SEDATION: Fentanyl 25 mcg IV; Versed 0 mg IV COMPLICATIONS: None PROCEDURE: Informed written consent was obtained from the patient after a thorough discussion of the procedural risks, benefits and alternatives. All questions were addressed. Maximal Sterile Barrier Technique was utilized including caps, mask, sterile gowns, sterile gloves, sterile drape, hand hygiene and skin antiseptic. A timeout was performed prior to the initiation of the procedure. Patient positioned supine position on the fluoroscopy table. Left chest was prepped and draped in the usual sterile fashion. Scout image was acquired. The skin and subcutaneous tissues at the anterior axillary line just below the nipple was generously infiltrated 1% lidocaine for local anesthesia. A Yueh needle was advanced under aspiration into the left chest. Catheter was advanced from the needle. A 035 wire was placed through the needle into the chest under fluoroscopy. The catheter was removed. Small incision was made on the wire. It is dilation of the soft tissues was performed and then a 10 French drain was placed with modified Seldinger technique. Pigtail was formed can the drain was sutured in position. Final image was stored. The catheter was then attached to a water seal chamber on suction. Patient tolerated the procedure well and remained hemodynamically stable throughout. No complications were encountered and no significant blood loss encountered. IMPRESSION: Status post left thoracostomy tube placement. Signed, Yvone Neu.  Loreta Ave, DO [facility] Radiology Electronically Signed   By: Gilmer Mor D.O.   On: 03/03/2016 15:21   Dg Chest Port 1 View  Result Date: 03/03/2016 CLINICAL DATA:  Follow-up left pneumothorax EXAM: PORTABLE CHEST 1 VIEW COMPARISON:  Chest radiograph from earlier today. FINDINGS: Interval placement of left basilar chest tube with the distal pigtail portion overlying the medial basilar left pleural space. Sternotomy wires appear aligned and intact. CABG clips overlie the mediastinum. Stable cardiomediastinal silhouette with mild cardiomegaly. Moderate left pneumothorax is decreased.  No right pneumothorax. No mediastinal shift. Small right pleural effusion, increased. No left pleural effusion. Stable mild pulmonary edema. Improved aeration in the left lung with persistent patchy opacity in the mid to lower left lung. IMPRESSION: 1. Persistent moderate left pneumothorax, decreased. No mediastinal shift. 2. Improved aeration of the left lung with persistent nonspecific patchy opacity in the mid to lower left lung. 3. Small right pleural effusion, increased. 4. Stable mild congestive heart failure. Electronically Signed   By: Delbert PhenixJason A Poff M.D.   On: 03/03/2016 16:44    Scheduled Meds: . acidophilus  1 capsule Oral Daily  . aspirin EC  81 mg Oral Daily  . cholecalciferol  1,000 Units Oral Daily  . enoxaparin (LOVENOX) injection  30 mg Subcutaneous Q24H  . feeding supplement (ENSURE ENLIVE)  237 mL Oral BID BM  . feeding supplement (PRO-STAT SUGAR FREE 64)  30 mL Oral BID  . furosemide  120 mg Intravenous BID  . mouth rinse  15 mL Mouth Rinse BID  . metoprolol  50 mg Oral Daily  . potassium chloride  40 mEq Oral BID  . sodium bicarbonate  325 mg Oral Daily  . sodium chloride flush  3 mL Intravenous Q12H  . vancomycin  125 mg Oral TID AC & HS   Followed by  . [START ON 03/06/2016] vancomycin  125 mg Oral BID   Followed by  . [START ON 03/11/2016]  vancomycin  125 mg Oral Daily   Followed by  . [START ON 03/16/2016] vancomycin  125 mg Oral Q48H  . vancomycin  250 mg Oral Once  . vitamin C  500 mg Oral Daily    Continuous Infusions:    Time spent: one hour  Alexsus Papadopoulos MD, PhD  [facility] Pager [phone]. If 7PM-7AM, please contact night-coverage at [email or web address], password [email or web address] 03/04/2016, 9:46 AM  LOS: 3 days

## 2016-03-04 NOTE — Progress Notes (Signed)
Report received in patient's room via Alvino ChapelJo RN using SBAR format, reviewed orders, labs, VS, meds, tests and patient's general condition, assumed care of patient.

## 2016-03-04 NOTE — Progress Notes (Signed)
Spoke with Dr. Roda ShuttersXu, communicated to pass on about fecal occult collection. Pt has had no BMs today. Will pass on to night shift.

## 2016-03-04 NOTE — Progress Notes (Signed)
PULMONARY / CRITICAL CARE MEDICINE   Name: [name] [name] MRN: [id number] DOB: 03-22-1928    ADMISSION DATE:  03/01/2016 CONSULTATION DATE:  03/03/16  REFERRING MD:  Dr. Erlinda Hong  CHIEF COMPLAINT:  Failure to Thrive   HISTORY OF PRESENT ILLNESS:   82 y/o M with extensive PMH to include arthritis, bilateral inguinal hernia, HTN, HLD, CAD s/p NSTEMI / CABG x3 (1997), stents in 2010, malnutrition (dating back to 2015), DM II, depression, CKD 5 (not on HD / previously declined, followed by Dr. Aldona Bar at [facility], baseline sr cr 3.8-5) and recent hospitalization at [facility] for one month for C-Diff colitis.  Hospital course complicated by acidosis, pleural effusion and encephalopathy.  Post discharge, he was transferred 9/1 to a SNF in [place].  On 9/6, he was accepted in transfer by Cardiology with volume overload, elevated BNP, troponin 0.1, AF with RVR.    Upon evaluation, he was found to be severely deconditioned, malnourished, altered with concerns of acute on chronic diastolic HF exacerbated by AF with RVR.  Palliative Care was called on admission.  The family has insisted he remain a full code.  He was treated with IV lasix, heparin gtt and lopressor.  Nephrology was consulted on admit and declared that he is a poor candidate for HD and agreed with palliative care consult.    Initial chest XRAY demonstrated a moderate to large left pleural effusion.  IR was consulted 9/7 for thoracentesis with 1.7L fluid removed.  Post thoracentesis completion CXR showed a small pneumothorax.  Follow up CXR on 9/8 showed enlarging pneumothorax and re-accumulation of pleural fluid.  Further, CVTS was consulted and they recommended placement of a chest tube noting that given the appearance of the lung on prior CT with calcified pleura the end-point would be absence of air leak and not complete re-expansion of the lung.    The patients family met with Palliative Care and refused any palliative  interventions.  SUBJECTIVE: Nurse in room. Patient c/o some pain at chest tube site and decubitus- being addressed. No dyspnea.   VITAL SIGNS: BP 96/69   Pulse (!) 102   Temp 97.5 F (36.4 C) (Axillary)   Resp 13   Ht 5' 9"  (1.753 m)   Wt 68 kg (150 lb)   SpO2 100%   BMI 22.15 kg/m   HEMODYNAMICS:    VENTILATOR SETTINGS:    INTAKE / OUTPUT: I/O last 3 completed shifts: In: 1900 [P.O.:1590; IV Piggyback:310] Out: 3329 [Urine:1600; Chest Tube:850]  PHYSICAL EXAMINATION: General:  Cachectic male in NAD lying supine, head elevated, air mattress. Neuro:  Awake, alert, oriented to self, generalized weakness  HEENT:  MM pink/dry,no jvd, temporal wasting  Cardiovascular:  s1s2 distant tones  Lungs:  Even/non-labored on 2L per [place], lungs bilaterally with basilar crackles, L chest tube to suction 20 with serosanguinous drainage, not bubbling Abdomen:  Soft,non-tender, bsx4 active  Musculoskeletal:  No acute deformities  Skin:  Warm/dry, no edema   LABS:  BMET  Recent Labs Lab 03/02/16 0342 03/03/16 0521 03/04/16 0504  NA 141 141 141  K 3.2* 3.2* 4.1  CL 108 106 107  CO2 24 25 29   BUN 50* 50* 51*  CREATININE 3.92* 4.02* 4.08*  GLUCOSE 72 92 150*    Electrolytes  Recent Labs Lab 03/02/16 0342 03/03/16 0521 03/04/16 0504  CALCIUM 7.7* 7.9* 8.0*  MG 1.8  --   --   PHOS 4.8* 5.0* 5.5*    CBC  Recent Labs Lab 03/02/16  2800 03/03/16 0521 03/04/16 0504  WBC 5.9 5.5 8.0  HGB 8.3* 9.6* 9.4*  HCT 26.8* 30.2* 30.4*  PLT 162 171 164    Coag's  Recent Labs Lab 03/01/16 0151  INR 1.52    Sepsis Markers No results for input(s): LATICACIDVEN, PROCALCITON, O2SATVEN in the last 168 hours.  ABG  Recent Labs Lab 03/01/16 1150 03/02/16 2115  PHART 7.378 7.408  PCO2ART 41.8 40.3  PO2ART 86.8 70.2*    Liver Enzymes  Recent Labs Lab 03/01/16 0151 03/02/16 0342 03/03/16 0521 03/04/16 0504  AST 29  --   --  20  ALT 18  --   --  14*  ALKPHOS  88  --   --  78  BILITOT 0.8  --   --  0.7  ALBUMIN 2.1* 1.8* 1.8* 1.8*  1.8*    Cardiac Enzymes  Recent Labs Lab 03/01/16 0151 03/01/16 0754 03/01/16 1239  TROPONINI 0.15* 0.15* 0.15*    Glucose No results for input(s): GLUCAP in the last 168 hours.  Imaging Ir Chest Fluoro  Result Date: 03/03/2016 INDICATION: 82 year old male with a history of pneumothorax EXAM: CHEST FLUOROSCOPY MEDICATIONS: The patient is currently admitted to the hospital and receiving intravenous antibiotics. The antibiotics were administered within an appropriate time frame prior to the initiation of the procedure. ANESTHESIA/SEDATION: Fentanyl 25 mcg IV; Versed 0 mg IV COMPLICATIONS: None PROCEDURE: Informed written consent was obtained from the patient after a thorough discussion of the procedural risks, benefits and alternatives. All questions were addressed. Maximal Sterile Barrier Technique was utilized including caps, mask, sterile gowns, sterile gloves, sterile drape, hand hygiene and skin antiseptic. A timeout was performed prior to the initiation of the procedure. Patient positioned supine position on the fluoroscopy table. Left chest was prepped and draped in the usual sterile fashion. Scout image was acquired. The skin and subcutaneous tissues at the anterior axillary line just below the nipple was generously infiltrated 1% lidocaine for local anesthesia. A Yueh needle was advanced under aspiration into the left chest. Catheter was advanced from the needle. A 035 wire was placed through the needle into the chest under fluoroscopy. The catheter was removed. Small incision was made on the wire. It is dilation of the soft tissues was performed and then a 10 French drain was placed with modified Seldinger technique. Pigtail was formed can the drain was sutured in position. Final image was stored. The catheter was then attached to a water seal chamber on suction. Patient tolerated the procedure well and remained  hemodynamically stable throughout. No complications were encountered and no significant blood loss encountered. IMPRESSION: Status post left thoracostomy tube placement. Signed, Dulcy Fanny. Earleen Newport, DO [facility] Radiology Electronically Signed   By: Corrie Mckusick D.O.   On: 03/03/2016 15:21   Dg Chest Port 1 View  Result Date: 03/03/2016 CLINICAL DATA:  Follow-up left pneumothorax EXAM: PORTABLE CHEST 1 VIEW COMPARISON:  Chest radiograph from earlier today. FINDINGS: Interval placement of left basilar chest tube with the distal pigtail portion overlying the medial basilar left pleural space. Sternotomy wires appear aligned and intact. CABG clips overlie the mediastinum. Stable cardiomediastinal silhouette with mild cardiomegaly. Moderate left pneumothorax is decreased. No right pneumothorax. No mediastinal shift. Small right pleural effusion, increased. No left pleural effusion. Stable mild pulmonary edema. Improved aeration in the left lung with persistent patchy opacity in the mid to lower left lung. IMPRESSION: 1. Persistent moderate left pneumothorax, decreased. No mediastinal shift. 2. Improved aeration of  the left lung with persistent nonspecific patchy opacity in the mid to lower left lung. 3. Small right pleural effusion, increased. 4. Stable mild congestive heart failure. Electronically Signed   By: Ilona Sorrel M.D.   On: 03/03/2016 16:44     STUDIES:  9/7  Pleural Fluid >> lab entries not found  CULTURES: RPR 9/7 >> non-reactive  ANTIBIOTICS: Vanco PO (c-diff) 9/8 >>   SIGNIFICANT EVENTS: 9/06  Admit to Cardiology with decompensated CHF, AF with RVR 9/07  IR thora, small ptx post (? Ex-vacuo) 9/08  Increased size of ptx, CVTS rec's for small bore chest tube per IR   DISCUSSION: 82 y/o frail elderly male with multiple medical problems and failure to thrive admitted per Cardiology for decompensated CHF, AF with RVR. Found to have effusion  s/p thora per IR with small ptx post.    ASSESSMENT / PLAN:  Left Pneumothorax - s/p thoracentesis for large chronic left pleural effusion  Left Pleural Effusion - no studies sent for analysis, appears chronic on CT  Lethargy - in setting of failure to thrive  Failure to Thrive  Atrial Fibrillation with RVR - now rate controlled Decompensated Combined Systolic / Diastolic CHF  Severe Protein Calorie Malnutrition C-Diff Colitis   Plan: Continue aggressive medical care > IVF, antibiotics, nutritional support, pain control  Vancomycin per primary for C-Diff  CT management per IR, CVTS  Follow CXR intermittently - ordered for 9/10 Assess CXR post CT placement  CT to suction > goal for absence of air leak and not complete re-expansion of the lung. O2 as needed to support sats > 92%  Incentive spirometry / pulmonary hygiene   FAMILY  - Updates:  Family updated at bedside extensively per Dr. Titus Mould.  They continue to desire "aggressive medical care".  Reviewed concept of intubation / CPR with family and that it performed it would not improve his quality of life or make him better.  Specifically, this type of care is medically futile in his circumstance and would only add to his suffering.  Wife and son want "to talk about it with the family".    - Inter-disciplinary family meet or Palliative Care meeting due by: ongoing   STAFF NOTE: From 9/8- Chest tube pigtail for PTX,our suspicion is that this effusion is old and no lung injury took place but lack of re expansion after thora,  not clear if lung will re expand all the way, he is severe malnourished with Mult orgain failure and refused HD in past and now is NOT a candidate for HD, would repeat pcxr now, keep pigtail to suction, prognosis is poor for survival, ETT is not needed and also would be futile and medically ineffective and not offered to family, family wants to talk to their other son about code status, ACLS , heroics of any kind  would be medically ineffective and futile, fortunately abg wnl and no distress or need for further support, continue lasix tro neg balance as able and follow renal fxn and lytes, will revisit in am for chest tube management, no role to move him to sdu or icu  CD Annamaria Boots, MD [facility] Pulmonary & Critical Care p [phone] After 3:00 PM  Please call [phone] 03/04/2016 9:08 AM

## 2016-03-04 NOTE — Progress Notes (Signed)
Notified from CCMD 6 beat run of V-tach at 9:25am, MD made aware.

## 2016-03-05 ENCOUNTER — Inpatient Hospital Stay (HOSPITAL_COMMUNITY): Payer: Medicare Other

## 2016-03-05 DIAGNOSIS — I4891 Unspecified atrial fibrillation: Secondary | ICD-10-CM

## 2016-03-05 LAB — RENAL FUNCTION PANEL
Albumin: 1.9 g/dL — ABNORMAL LOW (ref 3.5–5.0)
Anion gap: 9 (ref 5–15)
BUN: 57 mg/dL — AB (ref 6–20)
CALCIUM: 8.1 mg/dL — AB (ref 8.9–10.3)
CHLORIDE: 107 mmol/L (ref 101–111)
CO2: 26 mmol/L (ref 22–32)
CREATININE: 4.09 mg/dL — AB (ref 0.61–1.24)
GFR, EST AFRICAN AMERICAN: 14 mL/min — AB (ref >60)
GFR, EST NON AFRICAN AMERICAN: 12 mL/min — AB (ref >60)
Glucose, Bld: 113 mg/dL — ABNORMAL HIGH (ref 65–99)
Phosphorus: 4.6 mg/dL (ref 2.5–4.6)
Potassium: 4.6 mmol/L (ref 3.5–5.1)
SODIUM: 142 mmol/L (ref 135–145)

## 2016-03-05 LAB — MAGNESIUM: MAGNESIUM: 1.8 mg/dL (ref 1.7–2.4)

## 2016-03-05 LAB — HEPARIN LEVEL (UNFRACTIONATED): HEPARIN UNFRACTIONATED: 0.4 [IU]/mL (ref 0.30–0.70)

## 2016-03-05 MED ORDER — HEPARIN (PORCINE) IN NACL 100-0.45 UNIT/ML-% IJ SOLN
1000.0000 [IU]/h | INTRAMUSCULAR | Status: DC
  Administered 2016-03-05 – 2016-03-07 (×3): 1000 [IU]/h via INTRAVENOUS
  Filled 2016-03-05 (×4): qty 250

## 2016-03-05 MED ORDER — DILTIAZEM HCL-DEXTROSE 100-5 MG/100ML-% IV SOLN (PREMIX)
5.0000 mg/h | INTRAVENOUS | Status: DC
  Administered 2016-03-05 – 2016-03-07 (×3): 5 mg/h via INTRAVENOUS
  Filled 2016-03-05 (×4): qty 100

## 2016-03-05 NOTE — Progress Notes (Signed)
Referring Physician(s): Dr Janeece FittingF Xu Dr Sunday CornS Hendrickson  Supervising Physician: Gilmer MorWagner, Jaime  Patient Status:  Inpatient  Chief Complaint:  Left chest tube placement 9/8 PTX post thora 9/7 Chronic Left pleural effusion  Subjective:  Asymptomatic No complaints Frail; weak; thin Non verbal today---although does nod yes/no CXR today IMPRESSION: Aortic atherosclerosis. Stable large left-sided pneumothorax with chest tube in place. Increased left pleural effusion is noted. Diffuse right lung densities are noted concerning for edema or possibly pneumonia with associated pleural effusion.  Allergies: Statins  Medications: Prior to Admission medications   Medication Sig Start Date End Date Taking? Authorizing Provider  acetaminophen (TYLENOL) 650 MG CR tablet Take 650 mg by mouth every 4 (four) hours as needed for pain.   Yes Historical Provider, MD  escitalopram (LEXAPRO) 10 MG tablet Take 10 mg by mouth daily.  11/18/15  Yes Historical Provider, MD  furosemide (LASIX) 40 MG tablet Take 40 mg by mouth daily as needed for fluid.  10/20/15 10/19/16 Yes Historical Provider, MD  metoprolol succinate (TOPROL-XL) 50 MG 24 hr tablet Take 50 mg by mouth daily. Take with or immediately following a meal.   Yes Historical Provider, MD  mirtazapine (REMERON) 15 MG tablet Take 15 mg by mouth at bedtime.   Yes Historical Provider, MD  Multiple Vitamins-Minerals (EYE VITAMINS) CAPS Take 1 capsule by mouth daily.    Yes Historical Provider, MD  multivitamin (RENA-VIT) TABS tablet Take 1 tablet by mouth daily.   Yes Historical Provider, MD  Probiotic Product (ALIGN) 4 MG CAPS Take 4 mg by mouth daily.   Yes Historical Provider, MD  risperiDONE (RISPERDAL) 0.25 MG tablet Take 0.25 mg by mouth at bedtime.   Yes Historical Provider, MD  sodium bicarbonate 650 MG tablet Take 1,300 mg by mouth 2 (two) times daily.   Yes Historical Provider, MD  vancomycin (VANCOCIN) 250 MG capsule Take 500 mg by mouth  every 6 (six) hours. 02/26/16  Yes Historical Provider, MD  NITROSTAT 0.4 MG SL tablet DISSOLVE ONE TABLET UNDER THE TONGUE EVERY 5 MINUTES AS NEEDED FOR CHEST PAIN.  DO NOT EXCEED A TOTAL OF 3 DOSES IN 15 MINUTES Patient taking differently: Place 0.4 mg under the tongue every 5 (five) minutes as needed for chest pain (Do not exceed a total of 3 doses in 15 minutes).  11/14/13   Rollene RotundaJames Hochrein, MD     Vital Signs: BP 105/74 (BP Location: Left Arm)   Pulse 100   Temp 97.5 F (36.4 C) (Oral)   Resp 13   Ht 5\' 9"  (1.753 m)   Wt 150 lb (68 kg)   SpO2 100%   BMI 22.15 kg/m   Physical Exam  Pulmonary/Chest: No respiratory distress. He has wheezes.  Skin: Skin is warm and dry.  Left chest tube site clean and dry NT No bleeding 1L in pleurvac today (up from 800 yesterday) Serous fluid ++ air leak  Nursing note and vitals reviewed.   Imaging: Dg Chest 1 View  Result Date: 03/02/2016 CLINICAL DATA:  Left side pleural effusion. Post Thorocentesis on the left. Hx of CAD, HTN, NSTEMI, Pneumonia, DM2 EXAM: CHEST  1 VIEW COMPARISON:  Earlier film of the same day FINDINGS: Moderate left lateral and apical pneumothorax, without midline shift. Resolution of pleural effusion. Degree of left lung inflation is slightly improved from prior exam. Probable layering right pleural effusion as before with stable opacities in the mid and lower right lung. Heart size upper limits normal.  Previous  CABG. IMPRESSION: 1. Moderate left pleural effusion post thoracentesis, with no significant residual effusion, and overall slightly improved aeration of the left lung. Electronically Signed   By: Corlis Leak M.D.   On: 03/02/2016 16:30   Dg Chest 2 View  Result Date: 03/02/2016 CLINICAL DATA:  Left-sided pleural effusion EXAM: CHEST  2 VIEW COMPARISON:  03/01/2016 FINDINGS: Cardiac shadow is stable. Postoperative changes are again seen. A large left pleural effusion is again noted. It is at least partially mobile. The  small right effusion also appears somewhat mobile. IMPRESSION: Bilateral pleural effusions left greater than right with mobile components bilaterally. Degree of central vascular congestion appears somewhat worse than that seen on the prior exam. Electronically Signed   By: Alcide Clever M.D.   On: 03/02/2016 12:59   Ir Chest Fluoro  Result Date: 03/03/2016 INDICATION: 82 year old male with a history of pneumothorax EXAM: CHEST FLUOROSCOPY MEDICATIONS: The patient is currently admitted to the hospital and receiving intravenous antibiotics. The antibiotics were administered within an appropriate time frame prior to the initiation of the procedure. ANESTHESIA/SEDATION: Fentanyl 25 mcg IV; Versed 0 mg IV COMPLICATIONS: None PROCEDURE: Informed written consent was obtained from the patient after a thorough discussion of the procedural risks, benefits and alternatives. All questions were addressed. Maximal Sterile Barrier Technique was utilized including caps, mask, sterile gowns, sterile gloves, sterile drape, hand hygiene and skin antiseptic. A timeout was performed prior to the initiation of the procedure. Patient positioned supine position on the fluoroscopy table. Left chest was prepped and draped in the usual sterile fashion. Scout image was acquired. The skin and subcutaneous tissues at the anterior axillary line just below the nipple was generously infiltrated 1% lidocaine for local anesthesia. A Yueh needle was advanced under aspiration into the left chest. Catheter was advanced from the needle. A 035 wire was placed through the needle into the chest under fluoroscopy. The catheter was removed. Small incision was made on the wire. It is dilation of the soft tissues was performed and then a 10 French drain was placed with modified Seldinger technique. Pigtail was formed can the drain was sutured in position. Final image was stored. The catheter was then attached to a water seal chamber on suction. Patient  tolerated the procedure well and remained hemodynamically stable throughout. No complications were encountered and no significant blood loss encountered. IMPRESSION: Status post left thoracostomy tube placement. Signed, Yvone Neu. Loreta Ave, DO [facility] Electronically Signed   By: Gilmer Mor D.O.   On: 03/03/2016 15:21   Dg Chest Port 1 View  Result Date: 03/05/2016 CLINICAL DATA:  Left pneumothorax. EXAM: PORTABLE CHEST 1 VIEW COMPARISON:  Radiograph of March 03, 2016. FINDINGS: Stable cardiomediastinal silhouette. Status post coronary artery bypass graft. Atherosclerosis of thoracic aorta is noted. Large left-sided pneumothorax is again noted which is unchanged compared to prior exam. Left-sided chest tube is again noted. Stable right diffuse lung interstitial densities are noted concerning for edema. Stable right pleural effusion is noted. Increased left pleural effusion is noted. IMPRESSION: Aortic atherosclerosis. Stable large left-sided pneumothorax with chest tube in place. Increased left pleural effusion is noted. Diffuse right lung densities are noted concerning for edema or possibly pneumonia with associated pleural effusion. Electronically Signed   By: Lupita Raider, M.D.   On: 03/05/2016 09:47   Dg Chest Port 1 View  Result Date: 03/03/2016 CLINICAL DATA:  Follow-up left pneumothorax EXAM: PORTABLE CHEST 1 VIEW COMPARISON:  Chest radiograph from earlier  today. FINDINGS: Interval placement of left basilar chest tube with the distal pigtail portion overlying the medial basilar left pleural space. Sternotomy wires appear aligned and intact. CABG clips overlie the mediastinum. Stable cardiomediastinal silhouette with mild cardiomegaly. Moderate left pneumothorax is decreased. No right pneumothorax. No mediastinal shift. Small right pleural effusion, increased. No left pleural effusion. Stable mild pulmonary edema. Improved aeration in  the left lung with persistent patchy opacity in the mid to lower left lung. IMPRESSION: 1. Persistent moderate left pneumothorax, decreased. No mediastinal shift. 2. Improved aeration of the left lung with persistent nonspecific patchy opacity in the mid to lower left lung. 3. Small right pleural effusion, increased. 4. Stable mild congestive heart failure. Electronically Signed   By: Delbert Phenix M.D.   On: 03/03/2016 16:44   Dg Chest Port 1 View  Result Date: 03/03/2016 CLINICAL DATA:  Short of breath EXAM: PORTABLE CHEST 1 VIEW COMPARISON:  03/02/2016 FINDINGS: Sternotomy wires overlie normal cardiac silhouette. Large LEFT pneumothorax again demonstrated. The LEFT upper pleural edge measures 48 mm mid chest wall increased from 27 mm. The lateral pleural edge measures 47 mm compared to 44 remeasured. Minimal expansion of the LEFT lung. Interval increase in LEFT effusion compared to 1 day prior. No clear expansion of the LEFT lung. IMPRESSION: 1. Large LEFT pneumothorax measures slightly larger than comparison exam. Note mediastinal shift. 2. No expansion of the LEFT lung. 3. Interval reaccumulation of LEFT pleural fluid. 4. Stable RIGHT effusion These results will be called to the ordering clinician or representative by the Radiologist Assistant, and communication documented in the PACS or zVision Dashboard. Electronically Signed   By: Genevive Bi M.D.   On: 03/03/2016 08:59   Dg Chest Port 1 View  Result Date: 03/02/2016 CLINICAL DATA:  Follow-up pneumothorax EXAM: PORTABLE CHEST 1 VIEW COMPARISON:  9/7/ 17 at 16:25 FINDINGS: Cardiomediastinal silhouette is stable. Status post CABG again noted. Again noted moderate left upper and left lateral pneumothorax with atelectasis in left midlung in left lower lobe. The lateral component measures 55 mm thickness with slight progression from prior exam when measures 50 mm thickness. Stable right pleural effusion with right basilar atelectasis. IMPRESSION: Again  noted moderate left upper and left lateral pneumothorax with atelectasis in left midlung in left lower lobe. The lateral component measures 55 mm thickness with slight progression from prior exam when measured 50 mm thickness. Stable right pleural effusion with right basilar atelectasis. Electronically Signed   By: Natasha Mead M.D.   On: 03/02/2016 18:36   Dg Chest Port 1 View  Result Date: 03/01/2016 CLINICAL DATA:  Short of breath EXAM: PORTABLE CHEST 1 VIEW COMPARISON:  02/29/2016 FINDINGS: Two sternotomy wires overlies normal cardiac silhouette. Bilateral pleural effusions greater on the LEFT. No pneumothorax. Central venous congestion. IMPRESSION: 1. No significant change. 2. Central venous congestion and bilateral pleural effusion. Moderate to large effusion on the LEFT. Electronically Signed   By: Genevive Bi M.D.   On: 03/01/2016 12:07   US Thoracentesis Asp Pleural Space W/img Guide  Result Date: 03/02/2016 INDICATION: Shortness of breath. Chronic kidney disease stage 4. Large left pleural effusion. Request for therapeutic thoracentesis. EXAM: ULTRASOUND GUIDED LEFT THORACENTESIS MEDICATIONS: 1% Lidocaine. COMPLICATIONS: None immediate. PROCEDURE: An ultrasound guided thoracentesis was thoroughly discussed with the patient and questions answered. The benefits, risks, alternatives and complications were also discussed. The patient understands and wishes to proceed with the procedure. Written consent was obtained. Ultrasound was performed to localize and mark an adequate pocket of  fluid in the left chest. The area was then prepped and draped in the normal sterile fashion. 1% Lidocaine was used for local anesthesia. Under ultrasound guidance a 6 Fr Safe-T-Centesis catheter was introduced. Thoracentesis was performed. The catheter was removed and a dressing applied. FINDINGS: A total of approximately 1.7 liters of clear light orange fluid was removed. IMPRESSION: Successful ultrasound guided left  thoracentesis yielding 1.7 liters of pleural fluid. Read by:  Corrin Parker, PA-C Electronically Signed   By: Corlis Leak M.D.   On: 03/02/2016 16:10    Labs:  CBC:  Recent Labs  03/01/16 0151 03/02/16 0342 03/03/16 0521 03/04/16 0504  WBC 7.1 5.9 5.5 8.0  HGB 9.6* 8.3* 9.6* 9.4*  HCT 30.8* 26.8* 30.2* 30.4*  PLT 182 162 171 164    COAGS:  Recent Labs  03/01/16 0151  INR 1.52    BMP:  Recent Labs  03/02/16 0342 03/03/16 0521 03/04/16 0504 03/05/16 0621  NA 141 141 141 142  K 3.2* 3.2* 4.1 4.6  CL 108 106 107 107  CO2 24 25 29 26   GLUCOSE 72 92 150* 113*  BUN 50* 50* 51* 57*  CALCIUM 7.7* 7.9* 8.0* 8.1*  CREATININE 3.92* 4.02* 4.08* 4.09*  GFRNONAA 13* 12* 12* 12*  GFRAA 15* 14* 14* 14*    LIVER FUNCTION TESTS:  Recent Labs  03/01/16 0151 03/02/16 0342 03/03/16 0521 03/04/16 0504 03/05/16 0621  BILITOT 0.8  --   --  0.7  --   AST 29  --   --  20  --   ALT 18  --   --  14*  --   ALKPHOS 88  --   --  78  --   PROT 5.6*  --   --  4.8*  --   ALBUMIN 2.1* 1.8* 1.8* 1.8*  1.8* 1.9*    Assessment and Plan:  Large chronic left pleural effusion Left chest tube in place 1 L in Pleurvac Will follow   Electronically Signed: Arayah Krouse A 03/05/2016, 10:21 AM   I spent a total of 15 Minutes at the the patient's bedside AND on the patient's hospital floor or unit, greater than 50% of which was counseling/coordinating care for left chest tube

## 2016-03-05 NOTE — Progress Notes (Addendum)
PROGRESS NOTE  [name] KeelsRichard [name] NGE:[id number] DOB: 26-Nov-1927 DOA: 03/01/2016 PCP: Lindwood QuaHOFFMAN,BYRON, MD  HPI/Recap of past 24 hours:  Chest tube in place,  patient is very frail, nonverbal today, nod yes /no accasionally Denies pain currently,  only oriented to person Remain in afib, bp low normal  less lower extremity edema  Assessment/Plan: Principal Problem:   Acute diastolic (congestive) heart failure (HCC) Active Problems:   Essential hypertension   Encounter for palliative care   Goals of care, counseling/discussion   Chronic kidney disease (CKD), stage IV (severe) (HCC)   Acute confusional state   Acute heart disease   Pressure ulcer   NSTEMI (non-ST elevated myocardial infarction) (HCC)   Type II diabetes mellitus (HCC)   Malnutrition (HCC)   C. difficile colitis   Anemia    Pneumothorax:  . Thoracic surgery recommended IR for chest tube placement -IR and Critical care input appreciated  Acute diastolic heart failure in the setting of NSTEMI, afib with RVR and protein malnutrition.   -Echo with normal lvef, not able to evaluate diastolic function due to afib - Evaluated by the heart failure team who recommend he is at end-of-life and not candidate for advanced therapies Likely he'll live 3-6 months. Did recommend diuresing and trying to improve his nutrition and strength as well as palliative care consult - he was on a Lasix drip, now iv lasix, -continue BB if bp allows -cardiology signed off on 9/8, reconsulted on 9/10  NSTEMI. Patient was admitted to cardiology service, TRH take over on 9/7. Cardiology H&P notes indicate NSTEMI likely type 2 event in setting of HF and afib with RVR. EKG with afib and HB. Troponin stable.  Patient denies pain. Patient started on a heparin drip on admission. Chart review indicates intermittent episodes of 10-20 beat runs of Vtach.  -cardiology recommended palliative care consult and signed off. reconculted on 9/10   A. fib with  RVR.  [place] vasc score 5. On admission patient started on heparin drip and metoprolol. EKG today with sinus tachycardia with 1st degree AV block and Right BBB and left anterior fasicular block.  Heart rate 101 -he was started on heparin drip since admission, heparin d/ced by cardiology on 9/7 -continue beta blocker if bp allows -cardiology signed off, palliative care consulted, family want to continue aggressive measures, will need to talk to cardiology about anticoagualtion, patient does has anemia -cardiology reconsulted on 9/10, input appreciated    Acute on chronic kidney disease stage III to 4. Creatinine 3.92 trending down from 4.2 on admission. This appears to be closer to his baseline. Evaluated by nephrology who opined current function not far from his baseline per his note. Note also indicates he is a poor candidate for hemodialysis and recommend palliative care consult, nephrology signed off.   Acute vs subacute confusional state. Likely related to all above.   -B12 and folate and rpr wnl -if fails to continue to improve consider imaging -Advance diet per speech therapy recommendation, continue aspiration precaution.  Anemia. Likely of chronic disease/mlanutrition-+/-chronic GI loss.  No signs of obvious bleeding. -fobt pending -anemia panel b12/folate/iron unremarkable --monitor -consider transfusion when indicated  C. difficile. Patient in the hospital for the entire month of August for same. Was discharged to rehabilitation on oral bank. Ears stable. -Continue by mouth Vanco -precautions  Severe protein malnutrition/ deconditioning. Patient was in a SNF prior to this admission. Chart review indicates patient has had slow decline since 2015 with issues pertaining to mount nutrition and worsening  chronic kidney disease. However was reported that he participated in physical therapy at his recent SNF Speech therapy evaluation today. Of note heart failure team opines edema more  likely related to low protein versus #1. Albumin 2.1 of note Palliative Care consulted and note indicates family with realistic expectations. -Nutritional consult -Physical therapy consult -Advance diet per recommendations of speech therapy   Hypertension. History of same. Blood pressure quite soft but improving during this hospitalization. Medications include metoprolol Lasix. -Continue Lasix drip as noted above -Beta blocker per cardiology -Monitor    DVT prophylaxis: was on heparin, now on lovenox Code Status: full. Several providers and Palliative care have discussed poor prognosis with family. Family wish for patient to remain full code Family Communication: i spent more than on 9/9 to talk to patient's son mark and Lyndal in conference room, explained the over all poor prognosis, son insist on continue aggressive treatment he will make decision about alternative nutrition, I have encourage son to get in touch with  palliative team  to arrange family meeting  Consultants:   Nephrology coladonato ,signed off  Cardiology nishan, signed off, reconsulted on 9/10  Thoracic surgery, signed off  Interventional radiology  Palliative care ( consulted on 9/6, requested reconsult on 9/8)  Critical care consulted on 9/8 due to patient is at risk of respiratory failure and hemodynamic instability and family insists on being full code.  Procedures:  thoracentesis, chest tube placement  Antimicrobials: oral vancomycin per pharmacy    Objective: BP 111/75   Pulse (!) 111   Temp 97.3 F (36.3 C)   Resp 11   Ht 5\' 9"  (1.753 m)   Wt 68 kg (150 lb)   SpO2 100%   BMI 22.15 kg/m   Intake/Output Summary (Last 24 hours) at 03/05/16 0817 Last data filed at 03/05/16 0400  Gross per 24 hour  Intake              994 ml  Output             2050 ml  Net            -1056 ml   Filed Weights   03/03/16 0500 03/04/16 0600 03/05/16 0406  Weight: 70.5 kg (155 lb 6.4 oz) 68 kg  (150 lb) 68 kg (150 lb)    Exam:   General:  Chronically ill, very frail, today nonverbal  Cardiovascular: IRRR  Respiratory: diminished on the left, good air movement on the right  Abdomen: Soft/ND/NT, positive BS  Musculoskeletal: pitting Edema bilateral lower extremities seems slowing improving  Neuro: drowsy, not oriented to time or place  Data Reviewed: Basic Metabolic Panel:  Recent Labs Lab 03/01/16 0151 03/02/16 0342 03/03/16 0521 03/04/16 0504 03/05/16 0621  NA 140 141 141 141 142  K 3.5 3.2* 3.2* 4.1 4.6  CL 103 108 106 107 107  CO2 23 24 25 29 26   GLUCOSE 116* 72 92 150* 113*  BUN 52* 50* 50* 51* 57*  CREATININE 4.20* 3.92* 4.02* 4.08* 4.09*  CALCIUM 7.7* 7.7* 7.9* 8.0* 8.1*  MG  --  1.8  --   --  1.8  PHOS  --  4.8* 5.0* 5.5* 4.6   Liver Function Tests:  Recent Labs Lab 03/01/16 0151 03/02/16 0342 03/03/16 0521 03/04/16 0504 03/05/16 0621  AST 29  --   --  20  --   ALT 18  --   --  14*  --   ALKPHOS 88  --   --  78  --   BILITOT 0.8  --   --  0.7  --   PROT 5.6*  --   --  4.8*  --   ALBUMIN 2.1* 1.8* 1.8* 1.8*  1.8* 1.9*    Recent Labs Lab 03/04/16 0504  LIPASE 27    Recent Labs Lab 03/01/16 1035  AMMONIA 23   CBC:  Recent Labs Lab 03/01/16 0151 03/02/16 0342 03/03/16 0521 03/04/16 0504  WBC 7.1 5.9 5.5 8.0  NEUTROABS 4.9  --   --   --   HGB 9.6* 8.3* 9.6* 9.4*  HCT 30.8* 26.8* 30.2* 30.4*  MCV 93.3 93.7 93.2 95.6  PLT 182 162 171 164   Cardiac Enzymes:    Recent Labs Lab 03/01/16 0151 03/01/16 0754 03/01/16 1239  TROPONINI 0.15* 0.15* 0.15*   BNP (last 3 results) No results for input(s): BNP in the last 8760 hours.  ProBNP (last 3 results) No results for input(s): PROBNP in the last 8760 hours.  CBG: No results for input(s): GLUCAP in the last 168 hours.  Recent Results (from the past 240 hour(s))  MRSA PCR Screening     Status: None   Collection Time: 03/01/16  1:00 AM  Result Value Ref Range Status     MRSA by PCR NEGATIVE NEGATIVE Final    Comment:        The GeneXpert MRSA Assay (FDA approved for NASAL specimens only), is one component of a comprehensive MRSA colonization surveillance program. It is not intended to diagnose MRSA infection nor to guide or monitor treatment for MRSA infections.      Studies: No results found.  Scheduled Meds: . acidophilus  1 capsule Oral Daily  . aspirin EC  81 mg Oral Daily  . cholecalciferol  1,000 Units Oral Daily  . enoxaparin (LOVENOX) injection  30 mg Subcutaneous Q24H  . feeding supplement (ENSURE ENLIVE)  237 mL Oral BID BM  . feeding supplement (PRO-STAT SUGAR FREE 64)  30 mL Oral BID  . furosemide  120 mg Intravenous BID  . mouth rinse  15 mL Mouth Rinse BID  . metoprolol tartrate  12.5 mg Oral BID  . potassium chloride  40 mEq Oral BID  . sodium bicarbonate  325 mg Oral Daily  . sodium chloride flush  3 mL Intravenous Q12H  . vancomycin  125 mg Oral TID AC & HS   Followed by  . [START ON 03/06/2016] vancomycin  125 mg Oral BID   Followed by  . [START ON 03/11/2016] vancomycin  125 mg Oral Daily   Followed by  . [START ON 03/16/2016] vancomycin  125 mg Oral Q48H  . vancomycin  250 mg Oral Once  . vitamin C  500 mg Oral Daily    Continuous Infusions:    Time spent:  Xavior Niazi MD, PhD  [facility] Pager [phone]. If 7PM-7AM, please contact night-coverage at [email or web address], password [facility] 03/05/2016, 8:17 AM  LOS: 4 days

## 2016-03-05 NOTE — Progress Notes (Signed)
ANTICOAGULATION CONSULT NOTE - Follow Up Consult  Pharmacy Consult for heparin Indication: atrial fibrillation   Labs:  Recent Labs  03/03/16 0521 03/04/16 0504 03/05/16 0621 03/05/16 2155  HGB 9.6* 9.4*  --   --   HCT 30.2* 30.4*  --   --   PLT 171 164  --   --   HEPARINUNFRC <0.10*  --   --  0.40  CREATININE 4.02* 4.08* 4.09*  --      Assessment/Plan:  82yo male therapeutic on heparin after resumed. Will continue gtt at current rate and confirm stable with am labs.   Vernard GamblesVeronda Jazlene Bares, PharmD, BCPS  03/05/2016,11:18 PM

## 2016-03-05 NOTE — Progress Notes (Signed)
ANTICOAGULATION CONSULT NOTE - Initial Consult  Pharmacy Consult for Heparin WITHOUT BOLUS Indication: atrial fibrillation  Allergies  Allergen Reactions  . Statins Other (See Comments)    [facility]    Patient Measurements: Height: 5\' 9"  (175.3 cm) Weight: 150 lb (68 kg) IBW/kg (Calculated) : 70.7  Vital Signs: Temp: 97.3 F (36.3 C) (09/10 1132) Temp Source: Oral (09/10 1132) BP: 103/78 (09/10 1132) Pulse Rate: 122 (09/10 1132)  Labs:  Recent Labs  03/03/16 0521 03/04/16 0504 03/05/16 0621  HGB 9.6* 9.4*  --   HCT 30.2* 30.4*  --   PLT 171 164  --   HEPARINUNFRC <0.10*  --   --   CREATININE 4.02* 4.08* 4.09*    Estimated Creatinine Clearance: 12.5 mL/min (by C-G formula based on SCr of 4.09 mg/dL).   Medical History: Past Medical History:  Diagnosis Date  . Arthritis   . Bilateral inguinal hernia (BIH), left scrotal 11/28/2011  . C. difficile colitis   . CAD    CABG x 3 in 1997 by Dr. Laneta SimmersBartle; PCI in 2009  . HYPERLIPIDEMIA   . HYPERTENSION   . Malnutrition (HCC)   . NSTEMI (non-ST elevated myocardial infarction) (HCC)   . Pneumonia 1943  . RENAL INSUFFICIENCY   . Type II diabetes mellitus [facility](HCC)     Assessment: 82 yo male, admitted for AMS on 9/7, originally anticoagulated with heparin for NSTEMI/Afib, but was discontinued and changed to lovenox at DVT prophylactic dose as pt was not a candidate for advanced therapies due to poor prognosis. Family wishes for aggressive medical management. Pharmacy consulted for heparin for afib.   Of note, pt rec'd lovenox 30 mg subcut today at 1130. Will not bolus heparin.  Pt was previously on the low-end of therapeutic heparin levels on 900 units/hr.   Hgb low stable, pltc low but wnl, SCr ~4 stable.    Goal of Therapy:  Heparin level 0.3-0.7 units/ml Monitor platelets by anticoagulation protocol: Yes   Plan:  Start heparin infusion at 1000 units/hr Check anti-Xa level in 8 hours and daily  while on heparin Continue to monitor H&H and platelets  Allena Katzaroline E Chynah Orihuela, Pharm.D. PGY1 Pharmacy Resident 9/10/20171:39 PM Pager [phone]

## 2016-03-05 NOTE — Plan of Care (Addendum)
Problem: Pain Managment: Goal: General experience of comfort will improve Outcome: Progressing Pt turned Q30 via air mattress, repositioned per request with pillow wedge support as needed due to sacral pain. Area clean dry, intact, foam applied with barrier cream. Pt refuses pain medication at this time.   Problem: Fluid Volume: Goal: Ability to maintain a balanced intake and output will improve Outcome: Progressing Pt encouraged to use incentive spirometer. Needs assistance and cues. Pt able to achieve x1, x9. Will continue to reinforce, as well as encourage family to do with pt.    Chest xray this am. Chest tube output since insertion of serious sanguinous fluid. Left lung clear but diminished in apex, diminished in base.   Pt sat up high fowlers to eat breakfast, ate about 50%, 50% of ensure, and prostat. Left sitting up for 30 minutes. Pt tolerated well. Will continue to encourage to eat.   Problem: Bowel/Gastric: Goal: Will not experience complications related to bowel motility Outcome: Progressing diarrhea resolved.

## 2016-03-05 NOTE — Progress Notes (Signed)
Interval Hx: Dr. Eden Emms signed off 9/8 as pt was made palliative care. Today, Dr. Roda Shutters asked [place] to see as family now wants aggressive measures. Specifically, she asked about management of diastolic heart failure and rapid atrial fibrillation. Had been on IV heparin.  SUBJECTIVE: Denies chest pain.   ROS: Other than pertinent positives in "Subjective", all others were reviewed and found to be negative.   Intake/Output Summary (Last 24 hours) at 03/05/16 1301 Last data filed at 03/05/16 1200  Gross per 24 hour  Intake              754 ml  Output             2150 ml  Net            -1396 ml    Current Facility-Administered Medications  Medication Dose Route Frequency Provider Last Rate Last Dose  . acetaminophen (TYLENOL) tablet 650 mg  650 mg Oral Q4H PRN Macario Golds, MD   650 mg at 03/02/16 1818  . acidophilus (RISAQUAD) capsule 1 capsule  1 capsule Oral Daily Macario Golds, MD   1 capsule at 03/05/16 0741  . aspirin EC tablet 81 mg  81 mg Oral Daily Macario Golds, MD   81 mg at 03/05/16 0741  . cholecalciferol (VITAMIN D) tablet 1,000 Units  1,000 Units Oral Daily Macario Golds, MD   1,000 Units at 03/05/16 0740  . enoxaparin (LOVENOX) injection 30 mg  30 mg Subcutaneous Q24H Ralene Muskrat, PA-C   30 mg at 03/05/16 1136  . feeding supplement (ENSURE ENLIVE) (ENSURE ENLIVE) liquid 237 mL  237 mL Oral BID BM [name], MD   237 mL at 03/05/16 1400  . feeding supplement (PRO-STAT SUGAR FREE 64) liquid 30 mL  30 mL Oral BID [name], MD   30 mL at 03/05/16 0741  . furosemide (LASIX) 120 mg in dextrose 5 % 50 mL IVPB  120 mg Intravenous BID Macario Golds, MD   120 mg at 03/05/16 0736  . HYDROcodone-acetaminophen (NORCO/VICODIN) 5-325 MG per tablet 1-2 tablet  1-2 tablet Oral Q6H PRN Leanne Chang, NP   1 tablet at 03/05/16 1142  . MEDLINE mouth rinse  15 mL Mouth Rinse BID [name], MD   15 mL at 03/05/16 1000  . metoprolol tartrate (LOPRESSOR) tablet 12.5 mg  12.5 mg Oral BID [name], MD    12.5 mg at 03/05/16 0740  . ondansetron (ZOFRAN) injection 4 mg  4 mg Intravenous Q6H PRN Marat Fudim, MD      . potassium chloride SA (K-DUR,KLOR-CON) CR tablet 40 mEq  40 mEq Oral BID [name], MD   40 mEq at 03/05/16 0738  . sodium bicarbonate tablet 325 mg  325 mg Oral Daily Macario Golds, MD   325 mg at 03/05/16 0740  . sodium chloride flush (NS) 0.9 % injection 3 mL  3 mL Intravenous Q12H Marat Fudim, MD   3 mL at 03/05/16 1000  . sodium chloride flush (NS) 0.9 % injection 3 mL  3 mL Intravenous PRN Marat Fudim, MD      . vancomycin (VANCOCIN) 50 mg/mL oral solution 125 mg  125 mg Oral TID AC & HS Marat Fudim, MD   125 mg at 03/05/16 0740   Followed by  . [START ON 03/06/2016] vancomycin (VANCOCIN) 50 mg/mL oral solution 125 mg  125 mg Oral BID Macario Golds, MD       Followed by  . [START  ON 03/11/2016] vancomycin (VANCOCIN) 50 mg/mL oral solution 125 mg  125 mg Oral Daily [name], MD       Followed by  . [START ON 03/16/2016] vancomycin (VANCOCIN) 50 mg/mL oral solution 125 mg  125 mg Oral Q48H Marat Fudim, MD      . vancomycin (VANCOCIN) 50 mg/mL oral solution 250 mg  250 mg Oral Once [name], MD      . vitamin C (ASCORBIC ACID) tablet 500 mg  500 mg Oral Daily Marat Fudim, MD   500 mg at 03/05/16 0741    Vitals:   03/05/16 0040 03/05/16 0406 03/05/16 0832 03/05/16 1132  BP: 104/80 111/75 105/74 103/78  Pulse: (!) 111 (!) 111 100 (!) 122  Resp: 18 11 13 13   Temp: 98 F (36.7 C) 97.3 F (36.3 C) 97.5 F (36.4 C) 97.3 F (36.3 C)  TempSrc:   Oral Oral  SpO2: 96% 100% 100% 100%  Weight:  150 lb (68 kg)    Height:        PHYSICAL EXAM General: chronically ill, frail Lungs: Diminished CV: Nondisplaced PMI.  Tachycardic, irregular rhythm, normal S1/S2, no S3, no murmur.  Trace pretibial edema.  Musc: Trival pretibial edema Neuro: drowsy   TELEMETRY: Reviewed telemetry pt in rapid atrial fibrillation  LABS: Basic Metabolic Panel:  Recent Labs  44/06/207/09/17 0504  03/05/16 0621  NA 141 142  K 4.1 4.6  CL 107 107  CO2 29 26  GLUCOSE 150* 113*  BUN 51* 57*  CREATININE 4.08* 4.09*  CALCIUM 8.0* 8.1*  MG  --  1.8  PHOS 5.5* 4.6   Liver Function Tests:  Recent Labs  03/04/16 0504 03/05/16 0621  AST 20  --   ALT 14*  --   ALKPHOS 78  --   BILITOT 0.7  --   PROT 4.8*  --   ALBUMIN 1.8*  1.8* 1.9*    Recent Labs  03/04/16 0504  LIPASE 27   CBC:  Recent Labs  03/03/16 0521 03/04/16 0504  WBC 5.5 8.0  HGB 9.6* 9.4*  HCT 30.2* 30.4*  MCV 93.2 95.6  PLT 171 164   Cardiac Enzymes: No results for input(s): CKTOTAL, CKMB, CKMBINDEX, TROPONINI in the last 72 hours. BNP: Invalid input(s): POCBNP D-Dimer: No results for input(s): DDIMER in the last 72 hours. Hemoglobin A1C: No results for input(s): HGBA1C in the last 72 hours. Fasting Lipid Panel: No results for input(s): CHOL, HDL, LDLCALC, TRIG, CHOLHDL, LDLDIRECT in the last 72 hours. Thyroid Function Tests: No results for input(s): TSH, T4TOTAL, T3FREE, THYROIDAB in the last 72 hours.  Invalid input(s): FREET3 Anemia Panel: No results for input(s): VITAMINB12, FOLATE, FERRITIN, TIBC, IRON, RETICCTPCT in the last 72 hours.  RADIOLOGY: Dg Chest 1 View  Result Date: 03/02/2016 CLINICAL DATA:  Left side pleural effusion. Post Thorocentesis on the left. Hx of CAD, HTN, NSTEMI, Pneumonia, DM2 EXAM: CHEST  1 VIEW COMPARISON:  Earlier film of the same day FINDINGS: Moderate left lateral and apical pneumothorax, without midline shift. Resolution of pleural effusion. Degree of left lung inflation is slightly improved from prior exam. Probable layering right pleural effusion as before with stable opacities in the mid and lower right lung. Heart size upper limits normal.  Previous CABG. IMPRESSION: 1. Moderate left pleural effusion post thoracentesis, with no significant residual effusion, and overall slightly improved aeration of the left lung. Electronically Signed   By: Corlis Leak  Hassell M.D.    On: 03/02/2016 16:30   Dg  Chest 2 View  Result Date: 03/02/2016 CLINICAL DATA:  Left-sided pleural effusion EXAM: CHEST  2 VIEW COMPARISON:  03/01/2016 FINDINGS: Cardiac shadow is stable. Postoperative changes are again seen. A large left pleural effusion is again noted. It is at least partially mobile. The small right effusion also appears somewhat mobile. IMPRESSION: Bilateral pleural effusions left greater than right with mobile components bilaterally. Degree of central vascular congestion appears somewhat worse than that seen on the prior exam. Electronically Signed   By: Alcide Clever M.D.   On: 03/02/2016 12:59   Ir Chest Fluoro  Result Date: 03/03/2016 INDICATION: 82 year old male with a history of pneumothorax EXAM: CHEST FLUOROSCOPY MEDICATIONS: The patient is currently admitted to the hospital and receiving intravenous antibiotics. The antibiotics were administered within an appropriate time frame prior to the initiation of the procedure. ANESTHESIA/SEDATION: Fentanyl 25 mcg IV; Versed 0 mg IV COMPLICATIONS: None PROCEDURE: Informed written consent was obtained from the patient after a thorough discussion of the procedural risks, benefits and alternatives. All questions were addressed. Maximal Sterile Barrier Technique was utilized including caps, mask, sterile gowns, sterile gloves, sterile drape, hand hygiene and skin antiseptic. A timeout was performed prior to the initiation of the procedure. Patient positioned supine position on the fluoroscopy table. Left chest was prepped and draped in the usual sterile fashion. Scout image was acquired. The skin and subcutaneous tissues at the anterior axillary line just below the nipple was generously infiltrated 1% lidocaine for local anesthesia. A Yueh needle was advanced under aspiration into the left chest. Catheter was advanced from the needle. A 035 wire was placed through the needle into the chest under fluoroscopy. The catheter was removed. Small  incision was made on the wire. It is dilation of the soft tissues was performed and then a 10 French drain was placed with modified Seldinger technique. Pigtail was formed can the drain was sutured in position. Final image was stored. The catheter was then attached to a water seal chamber on suction. Patient tolerated the procedure well and remained hemodynamically stable throughout. No complications were encountered and no significant blood loss encountered. IMPRESSION: Status post left thoracostomy tube placement. Signed, Yvone Neu. Loreta Ave, DO [facility] Electronically Signed   By: Gilmer Mor D.O.   On: 03/03/2016 15:21   Dg Chest Port 1 View  Result Date: 03/05/2016 CLINICAL DATA:  Left pneumothorax. EXAM: PORTABLE CHEST 1 VIEW COMPARISON:  Radiograph of March 03, 2016. FINDINGS: Stable cardiomediastinal silhouette. Status post coronary artery bypass graft. Atherosclerosis of thoracic aorta is noted. Large left-sided pneumothorax is again noted which is unchanged compared to prior exam. Left-sided chest tube is again noted. Stable right diffuse lung interstitial densities are noted concerning for edema. Stable right pleural effusion is noted. Increased left pleural effusion is noted. IMPRESSION: Aortic atherosclerosis. Stable large left-sided pneumothorax with chest tube in place. Increased left pleural effusion is noted. Diffuse right lung densities are noted concerning for edema or possibly pneumonia with associated pleural effusion. Electronically Signed   By: Lupita Raider, M.D.   On: 03/05/2016 09:47   Dg Chest Port 1 View  Result Date: 03/03/2016 CLINICAL DATA:  Follow-up left pneumothorax EXAM: PORTABLE CHEST 1 VIEW COMPARISON:  Chest radiograph from earlier today. FINDINGS: Interval placement of left basilar chest tube with the distal pigtail portion overlying the medial basilar left pleural space. Sternotomy wires appear aligned and  intact. CABG clips overlie the mediastinum. Stable cardiomediastinal silhouette with mild cardiomegaly. Moderate left  pneumothorax is decreased. No right pneumothorax. No mediastinal shift. Small right pleural effusion, increased. No left pleural effusion. Stable mild pulmonary edema. Improved aeration in the left lung with persistent patchy opacity in the mid to lower left lung. IMPRESSION: 1. Persistent moderate left pneumothorax, decreased. No mediastinal shift. 2. Improved aeration of the left lung with persistent nonspecific patchy opacity in the mid to lower left lung. 3. Small right pleural effusion, increased. 4. Stable mild congestive heart failure. Electronically Signed   By: Delbert Phenix M.D.   On: 03/03/2016 16:44   Dg Chest Port 1 View  Result Date: 03/03/2016 CLINICAL DATA:  Short of breath EXAM: PORTABLE CHEST 1 VIEW COMPARISON:  03/02/2016 FINDINGS: Sternotomy wires overlie normal cardiac silhouette. Large LEFT pneumothorax again demonstrated. The LEFT upper pleural edge measures 48 mm mid chest wall increased from 27 mm. The lateral pleural edge measures 47 mm compared to 44 remeasured. Minimal expansion of the LEFT lung. Interval increase in LEFT effusion compared to 1 day prior. No clear expansion of the LEFT lung. IMPRESSION: 1. Large LEFT pneumothorax measures slightly larger than comparison exam. Note mediastinal shift. 2. No expansion of the LEFT lung. 3. Interval reaccumulation of LEFT pleural fluid. 4. Stable RIGHT effusion These results will be called to the ordering clinician or representative by the Radiologist Assistant, and communication documented in the PACS or zVision Dashboard. Electronically Signed   By: Genevive Bi M.D.   On: 03/03/2016 08:59   Dg Chest Port 1 View  Result Date: 03/02/2016 CLINICAL DATA:  Follow-up pneumothorax EXAM: PORTABLE CHEST 1 VIEW COMPARISON:  9/7/ 17 at 16:25 FINDINGS: Cardiomediastinal silhouette is stable. Status post CABG again noted. Again  noted moderate left upper and left lateral pneumothorax with atelectasis in left midlung in left lower lobe. The lateral component measures 55 mm thickness with slight progression from prior exam when measures 50 mm thickness. Stable right pleural effusion with right basilar atelectasis. IMPRESSION: Again noted moderate left upper and left lateral pneumothorax with atelectasis in left midlung in left lower lobe. The lateral component measures 55 mm thickness with slight progression from prior exam when measured 50 mm thickness. Stable right pleural effusion with right basilar atelectasis. Electronically Signed   By: Natasha Mead M.D.   On: 03/02/2016 18:36   Dg Chest Port 1 View  Result Date: 03/01/2016 CLINICAL DATA:  Short of breath EXAM: PORTABLE CHEST 1 VIEW COMPARISON:  02/29/2016 FINDINGS: Two sternotomy wires overlies normal cardiac silhouette. Bilateral pleural effusions greater on the LEFT. No pneumothorax. Central venous congestion. IMPRESSION: 1. No significant change. 2. Central venous congestion and bilateral pleural effusion. Moderate to large effusion on the LEFT. Electronically Signed   By: Genevive Bi M.D.   On: 03/01/2016 12:07   US Thoracentesis Asp Pleural Space W/img Guide  Result Date: 03/02/2016 INDICATION: Shortness of breath. Chronic kidney disease stage 4. Large left pleural effusion. Request for therapeutic thoracentesis. EXAM: ULTRASOUND GUIDED LEFT THORACENTESIS MEDICATIONS: 1% Lidocaine. COMPLICATIONS: None immediate. PROCEDURE: An ultrasound guided thoracentesis was thoroughly discussed with the patient and questions answered. The benefits, risks, alternatives and complications were also discussed. The patient understands and wishes to proceed with the procedure. Written consent was obtained. Ultrasound was performed to localize and mark an adequate pocket of fluid in the left chest. The area was then prepped and draped in the normal sterile fashion. 1% Lidocaine was used for  local anesthesia. Under ultrasound guidance a 6 Fr Safe-T-Centesis catheter was introduced. Thoracentesis was performed. The catheter was  removed and a dressing applied. FINDINGS: A total of approximately 1.7 liters of clear light orange fluid was removed. IMPRESSION: Successful ultrasound guided left thoracentesis yielding 1.7 liters of pleural fluid. Read by:  Corrin Parker, PA-C Electronically Signed   By: Corlis Leak M.D.   On: 03/02/2016 16:10      ASSESSMENT AND PLAN: 1. Rapid atrial fibrillation: On metoprolol 12.5 mg BID. Will start IV diltiazem and IV heparin for anticoagulation. Hgb 9.4 yesterday, will need to monitor given reinstitution of heparin.  2. Acute on chronic diastolic heart failure: Due to rapid atrial fib. On Lasix 120 mg BID.  Dispo: I agree that palliative care is the best option for this patient.  Prentice Docker, M.D., F.A.C.C.

## 2016-03-05 NOTE — Progress Notes (Signed)
Updated report via Alvino ChapelJo RN in patient's room using SBAR format, updated on today's events, assumed care of patient.

## 2016-03-05 NOTE — Progress Notes (Signed)
Cardizem initiated at 5, heparin at 10. Pt appears to be more somnolent this afternoon, oriented to self, incomprehensible speech, localizes pain. Able to nod yes and no.  PO intake poor. BP 100/61, HR currently 105. Several episodes of increased heart rate into the 150s today. Pt repositioned on right side. Urine output this shift .

## 2016-03-06 ENCOUNTER — Inpatient Hospital Stay (HOSPITAL_COMMUNITY): Payer: Medicare Other

## 2016-03-06 DIAGNOSIS — J939 Pneumothorax, unspecified: Secondary | ICD-10-CM

## 2016-03-06 DIAGNOSIS — I10 Essential (primary) hypertension: Secondary | ICD-10-CM

## 2016-03-06 DIAGNOSIS — I5030 Unspecified diastolic (congestive) heart failure: Secondary | ICD-10-CM

## 2016-03-06 DIAGNOSIS — I509 Heart failure, unspecified: Secondary | ICD-10-CM

## 2016-03-06 DIAGNOSIS — S270XXD Traumatic pneumothorax, subsequent encounter: Secondary | ICD-10-CM

## 2016-03-06 DIAGNOSIS — Z515 Encounter for palliative care: Secondary | ICD-10-CM

## 2016-03-06 DIAGNOSIS — J9819 Other pulmonary collapse: Secondary | ICD-10-CM

## 2016-03-06 DIAGNOSIS — A047 Enterocolitis due to Clostridium difficile: Secondary | ICD-10-CM

## 2016-03-06 DIAGNOSIS — J90 Pleural effusion, not elsewhere classified: Secondary | ICD-10-CM

## 2016-03-06 LAB — RENAL FUNCTION PANEL
ALBUMIN: 1.9 g/dL — AB (ref 3.5–5.0)
ANION GAP: 8 (ref 5–15)
BUN: 64 mg/dL — AB (ref 6–20)
CHLORIDE: 109 mmol/L (ref 101–111)
CO2: 26 mmol/L (ref 22–32)
Calcium: 8 mg/dL — ABNORMAL LOW (ref 8.9–10.3)
Creatinine, Ser: 3.94 mg/dL — ABNORMAL HIGH (ref 0.61–1.24)
GFR calc Af Amer: 15 mL/min — ABNORMAL LOW (ref >60)
GFR calc non Af Amer: 13 mL/min — ABNORMAL LOW (ref >60)
GLUCOSE: 141 mg/dL — AB (ref 65–99)
PHOSPHORUS: 3.9 mg/dL (ref 2.5–4.6)
POTASSIUM: 5.4 mmol/L — AB (ref 3.5–5.1)
Sodium: 143 mmol/L (ref 135–145)

## 2016-03-06 LAB — HEPARIN LEVEL (UNFRACTIONATED): Heparin Unfractionated: 0.44 IU/mL (ref 0.30–0.70)

## 2016-03-06 LAB — CBC
HEMATOCRIT: 32.9 % — AB (ref 39.0–52.0)
HEMOGLOBIN: 9.8 g/dL — AB (ref 13.0–17.0)
MCH: 29.3 pg (ref 26.0–34.0)
MCHC: 29.8 g/dL — AB (ref 30.0–36.0)
MCV: 98.5 fL (ref 78.0–100.0)
Platelets: 168 10*3/uL (ref 150–400)
RBC: 3.34 MIL/uL — ABNORMAL LOW (ref 4.22–5.81)
RDW: 16.5 % — ABNORMAL HIGH (ref 11.5–15.5)
WBC: 9.4 10*3/uL (ref 4.0–10.5)

## 2016-03-06 LAB — GLUCOSE, CAPILLARY: GLUCOSE-CAPILLARY: 234 mg/dL — AB (ref 65–99)

## 2016-03-06 NOTE — Progress Notes (Signed)
PROGRESS NOTE  [name] [name]:[id number] DOB: [birth year] DOA: 03/01/2016 PCP: Lindwood Qua, MD  HPI/Recap of past 24 hours:  Chest tube in place,  patient is very frail, nonverbal today, nod yes /no accasionally Denies pain currently,  only oriented to person Remain in afib,heart rate better, bp low normal  lower extremity edema has almost resolved  Assessment/Plan: Principal Problem:   Acute diastolic (congestive) heart failure (HCC) Active Problems:   Essential hypertension   Encounter for palliative care   Goals of care, counseling/discussion   Chronic kidney disease (CKD), stage IV (severe) (HCC)   Acute confusional state   Acute heart disease   Pressure ulcer   NSTEMI (non-ST elevated myocardial infarction) (HCC)   Type II diabetes mellitus (HCC)   Malnutrition (HCC)   C. difficile colitis   Anemia   CHF (congestive heart failure) (HCC)   Pneumothorax    Pneumothorax:  - Thoracic surgery recommended IR for chest tube placement -IR/ Critical care/thoracic surgery  input appreciated -plan per IR, I have discussed the case with IR Dr Loreta Ave who states plan to remove chest tube once no airleak, then allow re accumulation of pleural fluids and avoid tapping pleural effusion in the future, unless patient is significant symptomatic from it. Pulmonary/critical care also recommend avoid future thoracentesis as well, please see pccm /IR and thoracic surgery note for details.  Acute diastolic heart failure in the setting of NSTEMI, afib with RVR and protein malnutrition.   -Echo with normal lvef, not able to evaluate diastolic function due to afib - Evaluated by the heart failure team who recommend he is at end-of-life and not candidate for advanced therapies Likely he'll live 3-6 months. Did recommend diuresing and trying to improve his nutrition and strength as well as palliative care consult - he was on a Lasix drip, now iv lasix, -continue BB if bp allows -cardiology  signed off on 9/8, reconsulted on 9/10  NSTEMI. Patient was admitted to cardiology service, TRH take over on 9/7. Cardiology H&P notes indicate NSTEMI likely type 2 event in setting of HF and afib with RVR. EKG with afib and HB. Troponin stable.  Patient denies pain. Patient started on a heparin drip on admission. Chart review indicates intermittent episodes of 10-20 beat runs of Vtach.  -cardiology recommended palliative care consult and signed off. reconculted on 9/10    A. fib with RVR.  [place] vasc score 5. On admission patient started on heparin drip and metoprolol. EKG today with sinus tachycardia with 1st degree AV block and Right BBB and left anterior fasicular block.  Heart rate 101 -he was started on heparin drip since admission, heparin d/ced by cardiology on 9/7 -continue beta blocker if bp allows -cardiology signed off, palliative care consulted, family want to continue aggressive measures, will need to talk to cardiology about anticoagualtion, patient does has anemia -cardiology reconsulted on 9/10, input appreciated -currently on lopressor, cardizem drip and heparin drip, meds per cardiology.  Acute on chronic kidney disease stage III to 4. Creatinine 3.92 trending down from 4.2 on admission. This appears to be closer to his baseline. Evaluated by nephrology who opined current function not far from his baseline per his note. Note also indicates he is a poor candidate for hemodialysis and recommend palliative care consult, nephrology signed off.   Acute vs subacute confusional state. Likely related to all above.   -B12 and folate and rpr wnl -if fails to continue to improve consider imaging -Advance diet per speech therapy recommendation, continue  aspiration precaution.  Anemia. Likely of chronic disease/mlanutrition-+/-chronic GI loss.  No signs of obvious bleeding. -fobt pending -anemia panel b12/folate/iron unremarkable --monitor -consider transfusion when  indicated  C. difficile. Patient in the hospital for the entire month of August for same. Was discharged to rehabilitation on oral bank. Ears stable. -Continue by mouth Vanco, slow taper, last dose 10/1. -precautions  Severe protein malnutrition/ deconditioning. Patient was in a SNF prior to this admission. Chart review indicates patient has had slow decline since 2015 with issues pertaining to mount nutrition and worsening chronic kidney disease. However was reported that he participated in physical therapy at his recent SNF Speech therapy evaluation today. Of note heart failure team opines edema more likely related to low protein versus #1. Albumin 2.1 of note Palliative Care consulted and note indicates family with unrealistic expectations. -Nutritional consult -Physical therapy consult -Advance diet per recommendations of speech therapy   Hypertension. History of same. Blood pressure quite soft but improving during this hospitalization. Medications include metoprolol Lasix. -Continue Lasix drip as noted above -Beta blocker per cardiology -Monitor  FTT , poor prognosis, palliative care consulted, goal of care discussion on going.  DVT prophylaxis:  heparin drip Code Status: full. Several providers and Palliative care have discussed poor prognosis with family. Family wish for patient to remain full code Family Communication: i spent more than on 9/9 to talk to patient's son mark and Rad in conference room, explained the over all poor prognosis, son insist on continue aggressive treatment he will make decision about alternative nutrition, I have encourage son to get in touch with  palliative team  to arrange family meeting  Consultants:   Nephrology coladonato ,signed off  Thoracic surgery, signed off  Critical care consulted on 9/8 due to patient is at risk of respiratory failure and hemodynamic instability and family insists on being full code. Critical care indicating  patient is not a candidate for intubation, critical care signed off on 9/11  Patient was admitted to cardiology service, transferred to hospitalist service after Cardiology signed off, cardiology reconsulted on 9/10, now active following  Interventional radiology, continue to follow               Palliative care ( consulted on 9/6, requested reconsult on 9/8)  Procedures:  thoracentesis, chest tube placement  Antimicrobials: oral vancomycin per pharmacy    Objective: BP 116/65   Pulse (!) 102   Temp 98.2 F (36.8 C) (Axillary)   Resp 12   Ht 5\' 9"  (1.753 m)   Wt 64 kg (141 lb)   SpO2 99%   BMI 20.82 kg/m   Intake/Output Summary (Last 24 hours) at 03/06/16 1225 Last data filed at 03/06/16 1004  Gross per 24 hour  Intake           701.08 ml  Output             1300 ml  Net          -598.92 ml   Filed Weights   03/04/16 0600 03/05/16 0406 03/06/16 0416  Weight: 68 kg (150 lb) 68 kg (150 lb) 64 kg (141 lb)    Exam:   General:  Chronically ill, very frail, today nonverbal  Cardiovascular: IRRR  Respiratory: diminished on the left, good air movement on the right  Abdomen: Soft/ND/NT, positive BS  Musculoskeletal: pitting Edema bilateral lower extremities has almost resolved on exam on 9/11.  Neuro: drowsy, not oriented to time or place  Data Reviewed: Basic  Metabolic Panel:  Recent Labs Lab 03/02/16 0342 03/03/16 0521 03/04/16 0504 03/05/16 0621 03/06/16 0321  NA 141 141 141 142 143  K 3.2* 3.2* 4.1 4.6 5.4*  CL 108 106 107 107 109  CO2 24 25 29 26 26   GLUCOSE 72 92 150* 113* 141*  BUN 50* 50* 51* 57* 64*  CREATININE 3.92* 4.02* 4.08* 4.09* 3.94*  CALCIUM 7.7* 7.9* 8.0* 8.1* 8.0*  MG 1.8  --   --  1.8  --   PHOS 4.8* 5.0* 5.5* 4.6 3.9   Liver Function Tests:  Recent Labs Lab 03/01/16 0151 03/02/16 0342 03/03/16 0521 03/04/16 0504 03/05/16 0621 03/06/16 0321  AST 29  --   --  20  --   --   ALT 18  --   --  14*  --   --   ALKPHOS 88  --    --  78  --   --   BILITOT 0.8  --   --  0.7  --   --   PROT 5.6*  --   --  4.8*  --   --   ALBUMIN 2.1* 1.8* 1.8* 1.8*  1.8* 1.9* 1.9*    Recent Labs Lab 03/04/16 0504  LIPASE 27    Recent Labs Lab 03/01/16 1035  AMMONIA 23   CBC:  Recent Labs Lab 03/01/16 0151 03/02/16 0342 03/03/16 0521 03/04/16 0504 03/06/16 0321  WBC 7.1 5.9 5.5 8.0 9.4  NEUTROABS 4.9  --   --   --   --   HGB 9.6* 8.3* 9.6* 9.4* 9.8*  HCT 30.8* 26.8* 30.2* 30.4* 32.9*  MCV 93.3 93.7 93.2 95.6 98.5  PLT 182 162 171 164 168   Cardiac Enzymes:    Recent Labs Lab 03/01/16 0151 03/01/16 0754 03/01/16 1239  TROPONINI 0.15* 0.15* 0.15*   BNP (last 3 results) No results for input(s): BNP in the last 8760 hours.  ProBNP (last 3 results) No results for input(s): PROBNP in the last 8760 hours.  CBG: No results for input(s): GLUCAP in the last 168 hours.  Recent Results (from the past 240 hour(s))  MRSA PCR Screening     Status: None   Collection Time: 03/01/16  1:00 AM  Result Value Ref Range Status   MRSA by PCR NEGATIVE NEGATIVE Final    Comment:        The GeneXpert MRSA Assay (FDA approved for NASAL specimens only), is one component of a comprehensive MRSA colonization surveillance program. It is not intended to diagnose MRSA infection nor to guide or monitor treatment for MRSA infections.      Studies: No results found.  Scheduled Meds: . acidophilus  1 capsule Oral Daily  . aspirin EC  81 mg Oral Daily  . cholecalciferol  1,000 Units Oral Daily  . feeding supplement (ENSURE ENLIVE)  237 mL Oral BID BM  . feeding supplement (PRO-STAT SUGAR FREE 64)  30 mL Oral BID  . furosemide  120 mg Intravenous BID  . mouth rinse  15 mL Mouth Rinse BID  . metoprolol tartrate  12.5 mg Oral BID  . sodium bicarbonate  325 mg Oral Daily  . sodium chloride flush  3 mL Intravenous Q12H  . vancomycin  125 mg Oral BID   Followed by  . [START ON 03/11/2016] vancomycin  125 mg Oral Daily    Followed by  . [START ON 03/16/2016] vancomycin  125 mg Oral Q48H  . vancomycin  250 mg Oral Once  .  vitamin C  500 mg Oral Daily    Continuous Infusions: . diltiazem (CARDIZEM) infusion 5 mg/hr (03/06/16 1200)  . heparin 1,000 Units/hr (03/06/16 1200)     Time spent:  Aryn Safran MD, PhD  [facility] Pager [phone]. If 7PM-7AM, please contact night-coverage at [email or web address], password [facility] 03/06/2016, 12:25 PM  LOS: 5 days

## 2016-03-06 NOTE — Progress Notes (Signed)
ANTICOAGULATION CONSULT NOTE  Pharmacy Consult for Heparin  Indication: atrial fibrillation  Assessment: 82 yo male, admitted for AMS on 9/7, originally anticoagulated with heparin for NSTEMI/Afib, but was discontinued and changed to lovenox at DVT prophylactic dose as pt was not a candidate for advanced therapies due to poor prognosis. Family now wishes for aggressive medical management. Pharmacy consulted to restart heparin for afib.   Heparin level remains at goal on 1000 units/hr. No bleeding issues have been noted. Hgb low stable, pltc low but wnl, SCr ~4 stable.   This patients CHA2DS2-VASc Score and unadjusted Ischemic Stroke Rate (% per year) is equal to 7.2 % stroke rate/year from a score of 5  Continues in AFib today.  Above score calculated as 1 point each if present [CHF, HTN, DM, Vascular=MI/PAD/Aortic Plaque, Age if 65-74, or Male] Above score calculated as 2 points each if present [Age > 75, or Stroke/TIA/TE]   Goal of Therapy:  Heparin level 0.3-0.7 units/ml Monitor platelets by anticoagulation protocol: Yes   Plan:  Continue heparin infusion at 1000 units/hr Check anti-Xa level daily while on heparin Continue to monitor H&H and platelets   Allergies  Allergen Reactions  . Statins Other (See Comments)    [facility]    Patient Measurements: Height: 5\' 9"  (175.3 cm) Weight: 141 lb (64 kg) IBW/kg (Calculated) : 70.7  Vital Signs: Temp: 98.2 F (36.8 C) (09/11 0804) Temp Source: Axillary (09/11 0804) BP: 104/67 (09/11 0804) Pulse Rate: 99 (09/11 0804)  Labs:  Recent Labs  03/04/16 0504 03/05/16 0621 03/05/16 2155 03/06/16 0321  HGB 9.4*  --   --  9.8*  HCT 30.4*  --   --  32.9*  PLT 164  --   --  168  HEPARINUNFRC  --   --  0.40 0.44  CREATININE 4.08* 4.09*  --  3.94*    Estimated Creatinine Clearance: 12.2 mL/min (by C-G formula based on SCr of 3.94 mg/dL).   Medical History: Past Medical History:  Diagnosis Date  .  Arthritis   . Bilateral inguinal hernia (BIH), left scrotal 11/28/2011  . C. difficile colitis   . CAD    CABG x 3 in 1997 by Dr. Laneta SimmersBartle; PCI in 2009  . HYPERLIPIDEMIA   . HYPERTENSION   . Malnutrition (HCC)   . NSTEMI (non-ST elevated myocardial infarction) (HCC)   . Pneumonia 1943  . RENAL INSUFFICIENCY   . Type II diabetes mellitus (HCC)     Sheppard CoilFrank Danylle Ouk PharmD., BCPS Clinical Pharmacist Pager [phone] 03/06/2016 8:54 AM

## 2016-03-06 NOTE — Progress Notes (Signed)
PULMONARY / CRITICAL CARE MEDICINE   Name: [name] [name] MRN: [id number] DOB: Oct 31, 1927    ADMISSION DATE:  03/01/2016 CONSULTATION DATE:  03/03/16  REFERRING MD:  Dr. Erlinda Hong  CHIEF COMPLAINT:  Failure to Thrive   HISTORY OF PRESENT ILLNESS:   82 y/o M with extensive PMH to include arthritis, bilateral inguinal hernia, HTN, HLD, CAD s/p NSTEMI / CABG x3 (1997), stents in 2010, malnutrition (dating back to 2015), DM II, depression, CKD 5 (not on HD / previously declined, followed by Dr. Aldona Bar at [facility], baseline sr cr 3.8-5) and recent hospitalization at [facility] for one month for C-Diff colitis.  Hospital course complicated by acidosis, pleural effusion and encephalopathy.  Post discharge, he was transferred 9/1 to a SNF in [place].  On 9/6, he was accepted in transfer by Cardiology with volume overload, elevated BNP, troponin 0.1, AF with RVR.    Upon evaluation, he was found to be severely deconditioned, malnourished, altered with concerns of acute on chronic diastolic HF exacerbated by AF with RVR.  Palliative Care was called on admission.  The family has insisted he remain a full code.  He was treated with IV lasix, heparin gtt and lopressor.  Nephrology was consulted on admit and declared that he is a poor candidate for HD and agreed with palliative care consult.    Initial chest XRAY demonstrated a moderate to large left pleural effusion.  IR was consulted 9/7 for thoracentesis with 1.7L fluid removed.  Post thoracentesis completion CXR showed a small pneumothorax.  Follow up CXR on 9/8 showed enlarging pneumothorax and re-accumulation of pleural fluid.  Further, CVTS was consulted and they recommended placement of a chest tube noting that given the appearance of the lung on prior CT with calcified pleura the end-point would be absence of air leak and not complete re-expansion of the lung.    The patients family met with Palliative Care and refused any palliative  interventions.  SUBJECTIVE:  Nurse in room. Patient c/o some pain at chest tube site and decubitus- being addressed. No dyspnea.   VITAL SIGNS: BP 116/65 (BP Location: Right Arm)   Pulse 93   Temp 98.2 F (36.8 C) (Axillary)   Resp 12   Ht _0  (1.753 m)   Wt 64 kg (141 lb)   SpO2 99%   BMI 20.82 kg/m   HEMODYNAMICS:    VENTILATOR SETTINGS:    INTAKE / OUTPUT: I/O last 3 completed shifts: In: 1280.1 [P.O.:910; I.V.:246.1; IV Piggyback:124] Out: 2750 [Urine:2500; Chest Tube:250]  PHYSICAL EXAMINATION: General:  Cachectic, chronically ill appearing, weak male, NAD in bed  Neuro:  Awake, alert, oriented to self, generalized weakness  HEENT:  MM pink/dry,no jvd, temporal wasting  Cardiovascular:  s1s2 distant tones  Lungs:  Even/non-labored on 2L per [place], lungs bilaterally with basilar crackles, L chest tube to suction 20 with serosanguinous drainage, no airleak  Abdomen:  Soft,non-tender, bsx4 active  Musculoskeletal:  No acute deformities  Skin:  Warm/dry, no edema   LABS:  BMET  Recent Labs Lab 03/04/16 0504 03/05/16 0621 03/06/16 0321  NA 141 142 143  K 4.1 4.6 5.4*  CL 107 107 109  CO2 _1 BUN 51* 57* 64*  CREATININE 4.08* 4.09* 3.94*  GLUCOSE 150* 113* 141*    Electrolytes  Recent Labs Lab 03/02/16 0342  03/04/16 0504 03/05/16 0621 03/06/16 0321  CALCIUM 7.7*  < > 8.0* 8.1* 8.0*  MG 1.8  --   --  1.8  --  PHOS 4.8*  < > 5.5* 4.6 3.9  < > = values in this interval not displayed.  CBC  Recent Labs Lab 03/03/16 0521 03/04/16 0504 03/06/16 0321  WBC 5.5 8.0 9.4  HGB 9.6* 9.4* 9.8*  HCT 30.2* 30.4* 32.9*  PLT 171 164 168    Coag's  Recent Labs Lab 03/01/16 0151  INR 1.52    Sepsis Markers No results for input(s): LATICACIDVEN, PROCALCITON, O2SATVEN in the last 168 hours.  ABG  Recent Labs Lab 03/01/16 1150 03/02/16 2115  PHART 7.378 7.408  PCO2ART 41.8 40.3  PO2ART 86.8 70.2*    Liver Enzymes  Recent  Labs Lab 03/01/16 0151  03/04/16 0504 03/05/16 0621 03/06/16 0321  AST 29  --  20  --   --   ALT 18  --  14*  --   --   ALKPHOS 88  --  78  --   --   BILITOT 0.8  --  0.7  --   --   ALBUMIN 2.1*  < > 1.8*  1.8* 1.9* 1.9*  < > = values in this interval not displayed.  Cardiac Enzymes  Recent Labs Lab 03/01/16 0151 03/01/16 0754 03/01/16 1239  TROPONINI 0.15* 0.15* 0.15*    Glucose No results for input(s): GLUCAP in the last 168 hours.  Imaging No results found.   STUDIES:  9/7  Pleural Fluid >> lab entries not found  CULTURES: RPR 9/7 >> non-reactive  ANTIBIOTICS: Vanco PO (c-diff) 9/8 >>   SIGNIFICANT EVENTS: 9/06  Admit to Cardiology with decompensated CHF, AF with RVR 9/07  IR thora, small ptx post (? Ex-vacuo) 9/08  Increased size of ptx, CVTS rec's for small bore chest tube per IR 9/10 persistent Ptx, increasing L effusion, ?trapped lung    DISCUSSION: 82 y/o frail elderly male with multiple medical problems and failure to thrive admitted per Cardiology for decompensated CHF, AF with RVR. Underwent thoracentesis for chronic L pleural effusion with post procedure ptx which is now persistent.  Doubt will reexpand fully.   ASSESSMENT / PLAN:  Left Pneumothorax - s/p thoracentesis for large chronic left pleural effusion.  Persistent despite pigtail cath.  ?? entrapped lung.  Left Pleural Effusion - no studies sent for analysis, appears chronic on CT  Lethargy - in setting of failure to thrive  Failure to Thrive  Atrial Fibrillation with RVR - now rate controlled Decompensated Combined Systolic / Diastolic CHF  Severe Protein Calorie Malnutrition C-Diff Colitis   Plan: Continue aggressive medical care > IVF, antibiotics, nutritional support, pain control  Vancomycin per primary for C-Diff  CT management per IR Consider CVTS input although highly doubt candidate for further intervention  Follow CXR intermittently  Continue CT to suction > goal for  absence of air leak and not complete re-expansion of the lung. O2 as needed to support sats > 92%  Incentive spirometry / pulmonary hygiene  Cardiology following   Pt previously DNR, palliative care following and focus on comfort.  Over weekend family "changed their mind" and now wanting aggressive care.  Think that palliative care is much more appropriate in this situation.  Unclear how much, if any, improvement he will have as far as persistent ptx and pleural effusion. Suspect some degree trapped lung that will not reexpand and highly doubt he is a candidate for aggressive thoracic surgery.  He is severely malnourished with multiple medical problems.  ETT and mechanical ventilation would also be medically ineffective and futile.  Nickolas Madrid, NP 03/06/2016  9:28 AM Pager: [phone] or [phone]  Attending Note:  82 year old male cachectic and of very poor functional status presenting with pleural effusion, PTX, a-fib with RVR, c. Diff colitis and severe deconditioning and malnutrition.  Pig tail in place on CXR that I reviewed myself but lung is likely entrapped from previous inflammatory insult and will never expand.  There is realistically very little that can be done in this kind of condition except for to remove the pig tail (which on exam had no air leak and there was air movement on that side) and allow the pleural space to fill back up with fluid and never to tap it again.  Discussed with PCCM-NP.  Pleural effusion: due to lung entrapment.  - D/C pig tail if CVTS agree and never tap that side again.  - No further taps, fluid will recur.  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  - Recommend CVTS involvement, very little for pulmonary to do or can do in this kind of condition.  Impending respiratory failure: patient is very cachectic.  - Patient is not a candidate for intubation.  - Recommend involvement of palliative care and a discussion with family  (none present at the time of writing this note).  C. Diff:  - Continue abx as above.  - Monitor clinically.  A-fib with RVR:  - Focus on rate control.  PTX: will never resolve as long as the pig tail is in place.  - Recommend CVTS involvement - doubt will be an operative candidate for decortication.  - If not an operative candidate then IR (who placed the catheter) can remove it.  GOC: patient was DNR, family changed their mind over the weekend after a family meeting.  Patient is not a candidate for intubation as noted by Dr. Titus Mould and I am in full agreement.  From a medical standpoint, will not be intubated, if fails then palliation.  PCCM will sign off, please call back if needed.  Patient seen and examined, agree with above note.  I dictated the care and orders written for this patient under my direction.  Rush Farmer, MD [phone]

## 2016-03-06 NOTE — Progress Notes (Signed)
No charge note.   Patient is resting in bed, he is able to answer a few yes/no type questions, but I don't think he is able to discuss in detail. There is no family at the bedside.  In a detailed initial palliative consult family meeting 03-01-16, we were told by wife, son [name] [name] (main Management consultantdecision maker) that their goals are not palliative in nature at all.   We have been peripherally following and agree that a more palliative/comfort focused mode of care is appropriate. Unable to get in touch with [name] [name] over the phone. He has our card and knows how to contact us. We remain available to have another family meeting for further discussions.   [name] HawkingZeba Pranshu Lyster MD [facility] palliative [facility] team [phone]  [phone]

## 2016-03-06 NOTE — Progress Notes (Signed)
Patient ID: [name] [name], male   DOB: 10/08/1927, 82 y.o.   MRN: [id number]    Referring Physician(s): Xu,F/Hendrickson,S  Supervising Physician: Simonne Come  Patient Status:  Inpatient  Chief Complaint: Left pleural effusion/left pneumothorax  Subjective: Pt resting in bed; occ moans; some soreness at left chest tube site   Allergies: Statins  Medications: Prior to Admission medications   Medication Sig Start Date End Date Taking? Authorizing Provider  acetaminophen (TYLENOL) 650 MG CR tablet Take 650 mg by mouth every 4 (four) hours as needed for pain.   Yes Historical Provider, MD  escitalopram (LEXAPRO) 10 MG tablet Take 10 mg by mouth daily.  11/18/15  Yes Historical Provider, MD  furosemide (LASIX) 40 MG tablet Take 40 mg by mouth daily as needed for fluid.  10/20/15 10/19/16 Yes Historical Provider, MD  metoprolol succinate (TOPROL-XL) 50 MG 24 hr tablet Take 50 mg by mouth daily. Take with or immediately following a meal.   Yes Historical Provider, MD  mirtazapine (REMERON) 15 MG tablet Take 15 mg by mouth at bedtime.   Yes Historical Provider, MD  Multiple Vitamins-Minerals (EYE VITAMINS) CAPS Take 1 capsule by mouth daily.    Yes Historical Provider, MD  multivitamin (RENA-VIT) TABS tablet Take 1 tablet by mouth daily.   Yes Historical Provider, MD  Probiotic Product (ALIGN) 4 MG CAPS Take 4 mg by mouth daily.   Yes Historical Provider, MD  risperiDONE (RISPERDAL) 0.25 MG tablet Take 0.25 mg by mouth at bedtime.   Yes Historical Provider, MD  sodium bicarbonate 650 MG tablet Take 1,300 mg by mouth 2 (two) times daily.   Yes Historical Provider, MD  vancomycin (VANCOCIN) 250 MG capsule Take 500 mg by mouth every 6 (six) hours. 02/26/16  Yes Historical Provider, MD  NITROSTAT 0.4 MG SL tablet DISSOLVE ONE TABLET UNDER THE TONGUE EVERY 5 MINUTES AS NEEDED FOR CHEST PAIN.  DO NOT EXCEED A TOTAL OF 3 DOSES IN 15 MINUTES Patient taking differently: Place 0.4 mg under the tongue  every 5 (five) minutes as needed for chest pain (Do not exceed a total of 3 doses in 15 minutes).  11/14/13   [name] Rotunda, MD     Vital Signs: BP 116/65   Pulse (!) 102   Temp 98.2 F (36.8 C) (Axillary)   Resp 12   Ht 5\' 9"  (1.753 m)   Wt 141 lb (64 kg)   SpO2 99%   BMI 20.82 kg/m   Physical Exam left chest tube intact, output 100 cc, no air [name] appreciated today; f/u CXR pending  Imaging: Dg Chest 1 View  Result Date: 03/02/2016 CLINICAL DATA:  Left side pleural effusion. Post Thorocentesis on the left. Hx of CAD, HTN, NSTEMI, Pneumonia, DM2 EXAM: CHEST  1 VIEW COMPARISON:  Earlier film of the same day FINDINGS: Moderate left lateral and apical pneumothorax, without midline shift. Resolution of pleural effusion. Degree of left lung inflation is slightly improved from prior exam. Probable layering right pleural effusion as before with stable opacities in the mid and lower right lung. Heart size upper limits normal.  Previous CABG. IMPRESSION: 1. Moderate left pleural effusion post thoracentesis, with no significant residual effusion, and overall slightly improved aeration of the left lung. Electronically Signed   By: [name] [name] M.D.   On: 03/02/2016 16:30   Dg Chest 2 View  Result Date: 03/02/2016 CLINICAL DATA:  Left-sided pleural effusion EXAM: CHEST  2 VIEW COMPARISON:  03/01/2016 FINDINGS: Cardiac shadow is stable. Postoperative changes  are again seen. A large left pleural effusion is again noted. It is at least partially mobile. The small right effusion also appears somewhat mobile. IMPRESSION: Bilateral pleural effusions left greater than right with mobile components bilaterally. Degree of central vascular congestion appears somewhat worse than that seen on the prior exam. Electronically Signed   By: [name] [name] M.D.   On: 03/02/2016 12:59   Ir Chest Fluoro  Result Date: 03/03/2016 INDICATION: 82 year old male with a history of pneumothorax EXAM: CHEST FLUOROSCOPY MEDICATIONS:  The patient is currently admitted to the hospital and receiving intravenous antibiotics. The antibiotics were administered within an appropriate time frame prior to the initiation of the procedure. ANESTHESIA/SEDATION: Fentanyl 25 mcg IV; Versed 0 mg IV COMPLICATIONS: None PROCEDURE: Informed written consent was obtained from the patient after a thorough discussion of the procedural risks, benefits and alternatives. All questions were addressed. Maximal Sterile Barrier Technique was utilized including caps, mask, sterile gowns, sterile gloves, sterile drape, hand hygiene and skin antiseptic. A timeout was performed prior to the initiation of the procedure. Patient positioned supine position on the fluoroscopy table. Left chest was prepped and draped in the usual sterile fashion. Scout image was acquired. The skin and subcutaneous tissues at the anterior axillary line just below the nipple was generously infiltrated 1% lidocaine for local anesthesia. A Yueh needle was advanced under aspiration into the left chest. Catheter was advanced from the needle. A 035 wire was placed through the needle into the chest under fluoroscopy. The catheter was removed. Small incision was made on the wire. It is dilation of the soft tissues was performed and then a 10 French drain was placed with modified Seldinger technique. Pigtail was formed can the drain was sutured in position. Final image was stored. The catheter was then attached to a water seal chamber on suction. Patient tolerated the procedure well and remained hemodynamically stable throughout. No complications were encountered and no significant blood loss encountered. IMPRESSION: Status post left thoracostomy tube placement. Signed, [name] [name]. Loreta Ave, DO [facility] Electronically Signed   By: [name] [name] D.O.   On: 03/03/2016 15:21   Dg Chest Port 1 View  Result Date: 03/05/2016 CLINICAL DATA:  Left  pneumothorax. EXAM: PORTABLE CHEST 1 VIEW COMPARISON:  Radiograph of March 03, 2016. FINDINGS: Stable cardiomediastinal silhouette. Status post coronary artery bypass graft. Atherosclerosis of thoracic aorta is noted. Large left-sided pneumothorax is again noted which is unchanged compared to prior exam. Left-sided chest tube is again noted. Stable right diffuse lung interstitial densities are noted concerning for edema. Stable right pleural effusion is noted. Increased left pleural effusion is noted. IMPRESSION: Aortic atherosclerosis. Stable large left-sided pneumothorax with chest tube in place. Increased left pleural effusion is noted. Diffuse right lung densities are noted concerning for edema or possibly pneumonia with associated pleural effusion. Electronically Signed   By: Lupita Raider, M.D.   On: 03/05/2016 09:47   Dg Chest Port 1 View  Result Date: 03/03/2016 CLINICAL DATA:  Follow-up left pneumothorax EXAM: PORTABLE CHEST 1 VIEW COMPARISON:  Chest radiograph from earlier today. FINDINGS: Interval placement of left basilar chest tube with the distal pigtail portion overlying the medial basilar left pleural space. Sternotomy wires appear aligned and intact. CABG clips overlie the mediastinum. Stable cardiomediastinal silhouette with mild cardiomegaly. Moderate left pneumothorax is decreased. No right pneumothorax. No mediastinal shift. Small right pleural effusion, increased. No left pleural effusion. Stable mild pulmonary edema. Improved aeration in the left lung  with persistent patchy opacity in the mid to lower left lung. IMPRESSION: 1. Persistent moderate left pneumothorax, decreased. No mediastinal shift. 2. Improved aeration of the left lung with persistent nonspecific patchy opacity in the mid to lower left lung. 3. Small right pleural effusion, increased. 4. Stable mild congestive heart failure. Electronically Signed   By: Delbert PhenixJason A Poff M.D.   On: 03/03/2016 16:44   Dg Chest Port 1  View  Result Date: 03/03/2016 CLINICAL DATA:  Short of breath EXAM: PORTABLE CHEST 1 VIEW COMPARISON:  03/02/2016 FINDINGS: Sternotomy wires overlie normal cardiac silhouette. Large LEFT pneumothorax again demonstrated. The LEFT upper pleural edge measures 48 mm mid chest wall increased from 27 mm. The lateral pleural edge measures 47 mm compared to 44 remeasured. Minimal expansion of the LEFT lung. Interval increase in LEFT effusion compared to 1 day prior. No clear expansion of the LEFT lung. IMPRESSION: 1. Large LEFT pneumothorax measures slightly larger than comparison exam. Note mediastinal shift. 2. No expansion of the LEFT lung. 3. Interval reaccumulation of LEFT pleural fluid. 4. Stable RIGHT effusion These results will be called to the ordering clinician or representative by the Radiologist Assistant, and communication documented in the PACS or zVision Dashboard. Electronically Signed   By: Genevive BiStewart  Edmunds M.D.   On: 03/03/2016 08:59   Dg Chest Port 1 View  Result Date: 03/02/2016 CLINICAL DATA:  Follow-up pneumothorax EXAM: PORTABLE CHEST 1 VIEW COMPARISON:  9/7/ 17 at 16:25 FINDINGS: Cardiomediastinal silhouette is stable. Status post CABG again noted. Again noted moderate left upper and left lateral pneumothorax with atelectasis in left midlung in left lower lobe. The lateral component measures 55 mm thickness with slight progression from prior exam when measures 50 mm thickness. Stable right pleural effusion with right basilar atelectasis. IMPRESSION: Again noted moderate left upper and left lateral pneumothorax with atelectasis in left midlung in left lower lobe. The lateral component measures 55 mm thickness with slight progression from prior exam when measured 50 mm thickness. Stable right pleural effusion with right basilar atelectasis. Electronically Signed   By: Natasha MeadLiviu  Pop M.D.   On: 03/02/2016 18:36   [place] Thoracentesis Asp Pleural Space W/img Guide  Result Date: 03/02/2016 INDICATION:  Shortness of breath. Chronic kidney disease stage 4. Large left pleural effusion. Request for therapeutic thoracentesis. EXAM: ULTRASOUND GUIDED LEFT THORACENTESIS MEDICATIONS: 1% Lidocaine. COMPLICATIONS: None immediate. PROCEDURE: An ultrasound guided thoracentesis was thoroughly discussed with the patient and questions answered. The benefits, risks, alternatives and complications were also discussed. The patient understands and wishes to proceed with the procedure. Written consent was obtained. Ultrasound was performed to localize and mark an adequate pocket of fluid in the left chest. The area was then prepped and draped in the normal sterile fashion. 1% Lidocaine was used for local anesthesia. Under ultrasound guidance a 6 Fr Safe-T-Centesis catheter was introduced. Thoracentesis was performed. The catheter was removed and a dressing applied. FINDINGS: A total of approximately 1.7 liters of clear light orange fluid was removed. IMPRESSION: Successful ultrasound guided left thoracentesis yielding 1.7 liters of pleural fluid. Read by:  Corrin ParkerWendy Blair, PA-C Electronically Signed   By: [name] [name]  Hassell M.D.   On: 03/02/2016 16:10    Labs:  CBC:  Recent Labs  03/02/16 0342 03/03/16 0521 03/04/16 0504 03/06/16 0321  WBC 5.9 5.5 8.0 9.4  HGB 8.3* 9.6* 9.4* 9.8*  HCT 26.8* 30.2* 30.4* 32.9*  PLT 162 171 164 168    COAGS:  Recent Labs  03/01/16 0151  INR 1.52  BMP:  Recent Labs  03/03/16 0521 03/04/16 0504 03/05/16 0621 03/06/16 0321  NA 141 141 142 143  K 3.2* 4.1 4.6 5.4*  CL 106 107 107 109  CO2 25 29 26 26   GLUCOSE 92 150* 113* 141*  BUN 50* 51* 57* 64*  CALCIUM 7.9* 8.0* 8.1* 8.0*  CREATININE 4.02* 4.08* 4.09* 3.94*  GFRNONAA 12* 12* 12* 13*  GFRAA 14* 14* 14* 15*    LIVER FUNCTION TESTS:  Recent Labs  03/01/16 0151  03/03/16 0521 03/04/16 0504 03/05/16 0621 03/06/16 0321  BILITOT 0.8  --   --  0.7  --   --   AST 29  --   --  20  --   --   ALT 18  --   --  14*  --    --   ALKPHOS 88  --   --  78  --   --   PROT 5.6*  --   --  4.8*  --   --   ALBUMIN 2.1*  < > 1.8* 1.8*  1.8* 1.9* 1.9*  < > = values in this interval not displayed.  Assessment and Plan: S/p ex vacuo ptx post thoracentesis 9/7 with chest tube placement; CXR today pending; no definite air [name] on pleuravac; await TCTS f/u note before considering tube removal   Electronically Signed: D. Jeananne Rama 03/06/2016, 12:35 PM   I spent a total of 15 minutes at the the patient's bedside AND on the patient's hospital floor or unit, greater than 50% of which was counseling/coordinating care for left chest tube placement

## 2016-03-06 NOTE — Care Management Important Message (Signed)
Important Message  Patient Details  Name: [name] KeelsRichard Lana MRN: [id number] Date of Birth: December 25, 1927   Medicare Important Message Given:  Yes    Gala LewandowskyGraves-Bigelow, Harue Pribble Kaye, RN 03/06/2016, 10:52 AM

## 2016-03-06 NOTE — Progress Notes (Signed)
Speech Language Pathology Treatment: Dysphagia  Patient Details Name: [name] KeelsRichard [name] MRN: [id number] DOB: Dec 24, 1927 Today's Date: 03/06/2016 Time: [id number] SLP Time Calculation (min) (ACUTE ONLY): 36 min  Assessment / Plan / Recommendation Clinical Impression  SLP repositioned pt for PO trials. Pt showed oral holding during trials of puree, but followed by sips of liquid oral clearance was achieved. Delayed coughing and throat clearing were observed, as well as multiple audible swallows on thin liquids. Given nectar thick liquid, pt seemed to tolerate small bolus amounts well, but had immediate cough on a larger sip. Pt showed intermittent belching and required moderate cueing for small sips throughout tx. Considering observed s/s of aspiration, recommend Dys 1 diet and nectar thick liquids. Will continue to f/u for diet tolerance.   HPI HPI:  82 year old gentleman with a past medical history significant for stage IV chronic kidney disease, hypertension, dyslipidemia, diabetes mellitus, coronary artery disease status post CABG 3 in 1997, stents in 2010, history of bilateral inguinal hernia. Patient has had subacute-chronic decline essentially since 2015 with issues pertaining to malnutrition and worsening chronic kidney disease. More recently, patient was hospitalized at [facility] from August 2-09/06/2015 for Clostridium difficile infection, metabolic acidosis, pleural effusion, encephalopathy. The patient was deemed to have improved enough to be transitioned to short-term rehabilitation from [facility] on 02-25-16. It is reported that the patient seemed to have been participating in physical therapy there and was compliant with his medications there however developed confusion and was [facility]. Patient has been admitted with working diagnosis of diastolic congestive heart failure exacerbation, possible non-ST segment elevation MI, atrial  fibrillation, altered mental status and fluid overload.      SLP Plan  Continue with current plan of care     Recommendations  Diet recommendations: Dysphagia 1 (puree);Nectar-thick liquid Liquids provided via: Cup;No straw Medication Administration: Whole meds with puree Supervision: Full supervision/cueing for compensatory strategies Compensations: Slow rate;Small sips/bites;Minimize environmental distractions Postural Changes and/or Swallow Maneuvers: Upright 30-60 min after meal;Seated upright 90 degrees             Oral Care Recommendations: Oral care BID Follow up Recommendations: 24 hour supervision/assistance;Skilled Nursing facility Plan: Continue with current plan of care     GO               Tollie EthHaleigh Ragan Dray Dente, Student SLP  Caryl NeverHaleigh R Xzander Gilham 03/06/2016, 2:28 PM

## 2016-03-06 NOTE — Progress Notes (Signed)
Interval Hx: Dr. Eden Emms signed off 9/8 as pt was made palliative care. On 03/05/16, Dr. Roda Shutters asked [place] to see as family now wants aggressive measures. Specifically, she asked about management of diastolic heart failure and rapid atrial fibrillation. Had been on IV heparin.  SUBJECTIVE: Denies chest pain this morning. Lying in bed watching TV.   ROS: Other than pertinent positives in "Subjective", all others were reviewed and found to be negative.   Intake/Output Summary (Last 24 hours) at 03/06/16 0837 Last data filed at 03/06/16 0800  Gross per 24 hour  Intake           778.08 ml  Output             1850 ml  Net         -1071.92 ml    Current Facility-Administered Medications  Medication Dose Route Frequency Provider Last Rate Last Dose  . acetaminophen (TYLENOL) tablet 650 mg  650 mg Oral Q4H PRN Macario Golds, MD   650 mg at 03/02/16 1818  . acidophilus (RISAQUAD) capsule 1 capsule  1 capsule Oral Daily Macario Golds, MD   1 capsule at 03/05/16 0741  . aspirin EC tablet 81 mg  81 mg Oral Daily Macario Golds, MD   81 mg at 03/05/16 0741  . cholecalciferol (VITAMIN D) tablet 1,000 Units  1,000 Units Oral Daily Macario Golds, MD   1,000 Units at 03/05/16 0740  . diltiazem (CARDIZEM) 100 mg in dextrose 5% (1 mg/mL) infusion  5-15 mg/hr Intravenous Titrated [name], MD 5 mL/hr at 03/06/16 0800 5 mg/hr at 03/06/16 0800  . feeding supplement (ENSURE ENLIVE) (ENSURE ENLIVE) liquid 237 mL  237 mL Oral BID BM [name], MD   237 mL at 03/05/16 1400  . feeding supplement (PRO-STAT SUGAR FREE 64) liquid 30 mL  30 mL Oral BID [name], MD   30 mL at 03/05/16 2106  . furosemide (LASIX) 120 mg in dextrose 5 % 50 mL IVPB  120 mg Intravenous BID Marat Fudim, MD   120 mg at 03/05/16 1659  . heparin ADULT infusion 100 units/mL (25000 units/257mL sodium chloride 0.45%)  1,000 Units/hr Intravenous Continuous [name], MD 10 mL/hr at 03/06/16 0800 1,000 Units/hr at 03/06/16 0800  .  HYDROcodone-acetaminophen (NORCO/VICODIN) 5-325 MG per tablet 1-2 tablet  1-2 tablet Oral Q6H PRN Leanne Chang, NP   1 tablet at 03/06/16 0230  . MEDLINE mouth rinse  15 mL Mouth Rinse BID [name], MD   15 mL at 03/05/16 2113  . metoprolol tartrate (LOPRESSOR) tablet 12.5 mg  12.5 mg Oral BID [name], MD   12.5 mg at 03/05/16 2105  . ondansetron (ZOFRAN) injection 4 mg  4 mg Intravenous Q6H PRN Marat Fudim, MD      . potassium chloride SA (K-DUR,KLOR-CON) CR tablet 40 mEq  40 mEq Oral BID [name], MD   40 mEq at 03/05/16 2105  . sodium bicarbonate tablet 325 mg  325 mg Oral Daily Macario Golds, MD   325 mg at 03/05/16 0740  . sodium chloride flush (NS) 0.9 % injection 3 mL  3 mL Intravenous Q12H Marat Fudim, MD   3 mL at 03/05/16 1000  . sodium chloride flush (NS) 0.9 % injection 3 mL  3 mL Intravenous PRN Marat Fudim, MD      . vancomycin (VANCOCIN) 50 mg/mL oral solution 125 mg  125 mg Oral BID Macario Golds, MD  Followed by  . [START ON 03/11/2016] vancomycin (VANCOCIN) 50 mg/mL oral solution 125 mg  125 mg Oral Daily [name], MD       Followed by  . [START ON 03/16/2016] vancomycin (VANCOCIN) 50 mg/mL oral solution 125 mg  125 mg Oral Q48H Marat Fudim, MD      . vancomycin (VANCOCIN) 50 mg/mL oral solution 250 mg  250 mg Oral Once [name], MD      . vitamin C (ASCORBIC ACID) tablet 500 mg  500 mg Oral Daily Marat Fudim, MD   500 mg at 03/05/16 0741    Vitals:   03/06/16 0322 03/06/16 0352 03/06/16 0416 03/06/16 0804  BP: 100/60 98/63 (!) 110/55 104/67  Pulse: 85 86 91 99  Resp: 13 11 15 13   Temp:   97.8 F (36.6 C) 98.2 F (36.8 C)  TempSrc:    Axillary  SpO2: 96% 97% 96% 98%  Weight:   141 lb (64 kg)   Height:        PHYSICAL EXAM General: chronically ill, frail Lungs: Diminished CV: Nondisplaced PMI.  Tachycardic, irregular rhythm, normal S1/S2, no S3, no murmur.  Trace pretibial edema.  Musc: Trival pretibial edema Neuro: drowsy   TELEMETRY: Atrial  fibrillation (rate better controlled in the 90s)  LABS: Basic Metabolic Panel:  Recent Labs  02/72/5308/04/11 0621 03/06/16 0321  NA 142 143  K 4.6 5.4*  CL 107 109  CO2 26 26  GLUCOSE 113* 141*  BUN 57* 64*  CREATININE 4.09* 3.94*  CALCIUM 8.1* 8.0*  MG 1.8  --   PHOS 4.6 3.9   Liver Function Tests:  Recent Labs  03/04/16 0504 03/05/16 0621 03/06/16 0321  AST 20  --   --   ALT 14*  --   --   ALKPHOS 78  --   --   BILITOT 0.7  --   --   PROT 4.8*  --   --   ALBUMIN 1.8*  1.8* 1.9* 1.9*    Recent Labs  03/04/16 0504  LIPASE 27   CBC:  Recent Labs  03/04/16 0504 03/06/16 0321  WBC 8.0 9.4  HGB 9.4* 9.8*  HCT 30.4* 32.9*  MCV 95.6 98.5  PLT 164 168   Cardiac Enzymes: No results for input(s): CKTOTAL, CKMB, CKMBINDEX, TROPONINI in the last 72 hours. BNP: Invalid input(s): POCBNP D-Dimer: No results for input(s): DDIMER in the last 72 hours. Hemoglobin A1C: No results for input(s): HGBA1C in the last 72 hours. Fasting Lipid Panel: No results for input(s): CHOL, HDL, LDLCALC, TRIG, CHOLHDL, LDLDIRECT in the last 72 hours. Thyroid Function Tests: No results for input(s): TSH, T4TOTAL, T3FREE, THYROIDAB in the last 72 hours.  Invalid input(s): FREET3 Anemia Panel: No results for input(s): VITAMINB12, FOLATE, FERRITIN, TIBC, IRON, RETICCTPCT in the last 72 hours.  RADIOLOGY:   Dg Chest Port 1 View  Result Date: 03/05/2016 CLINICAL DATA:  Left pneumothorax. EXAM: PORTABLE CHEST 1 VIEW COMPARISON:  Radiograph of March 03, 2016. FINDINGS: Stable cardiomediastinal silhouette. Status post coronary artery bypass graft. Atherosclerosis of thoracic aorta is noted. Large left-sided pneumothorax is again noted which is unchanged compared to prior exam. Left-sided chest tube is again noted. Stable right diffuse lung interstitial densities are noted concerning for edema. Stable right pleural effusion is noted. Increased left pleural effusion is noted. IMPRESSION:  Aortic atherosclerosis. Stable large left-sided pneumothorax with chest tube in place. Increased left pleural effusion is noted. Diffuse right lung densities are noted concerning for edema  or possibly pneumonia with associated pleural effusion. Electronically Signed   By: Lupita Raider, M.D.   On: 03/05/2016 09:47    ASSESSMENT AND PLAN: 1. Rapid atrial fibrillation: On metoprolol 12.5 mg BID. Started IV diltiazem and IV heparin for anticoagulation yesterday, with improvement in HR into the 90s. Hgb stable today at 9.8. Monitor CBC.  2. Acute on chronic diastolic heart failure: Due to rapid atrial fib. On Lasix 120 mg BID, UOP 1.5L yesterday. Does not appear grossly volume overloaded.   Dispo: Decision was made to make the patient palliative care last week, but then changed over the weekend with family know wanting aggressive care.   Laverda Page NP-C  Agree with note by Laverda Page NP-C  Pt with HFwPLVF. CRI, remote CABG (97) . New onset Afib on IV dilt and hep. SCr 4. He is clearly not an OAC candidate. No good options here other than Med Rx. On high dose IV lasix with I/O neg 3,4 liters. At this point I recommend palliative care . Can convert IV to PO CCB or BB for rate control. Will s/o. Call for further questions.    Runell Gess, M.D., FACP, [facility], [facility] [address] [place], [place]  [address]  [phone] 03/06/2016 9:52 AM

## 2016-03-07 ENCOUNTER — Inpatient Hospital Stay (HOSPITAL_COMMUNITY): Payer: Medicare Other

## 2016-03-07 ENCOUNTER — Other Ambulatory Visit (HOSPITAL_COMMUNITY): Payer: Medicare Other

## 2016-03-07 ENCOUNTER — Inpatient Hospital Stay
Admission: RE | Admit: 2016-03-07 | Discharge: 2016-04-26 | Disposition: E | Payer: Medicare Other | Attending: Internal Medicine | Admitting: Internal Medicine

## 2016-03-07 DIAGNOSIS — N259 Disorder resulting from impaired renal tubular function, unspecified: Secondary | ICD-10-CM | POA: Diagnosis present

## 2016-03-07 DIAGNOSIS — I4891 Unspecified atrial fibrillation: Secondary | ICD-10-CM | POA: Diagnosis present

## 2016-03-07 DIAGNOSIS — R627 Adult failure to thrive: Secondary | ICD-10-CM

## 2016-03-07 DIAGNOSIS — Z9689 Presence of other specified functional implants: Secondary | ICD-10-CM

## 2016-03-07 DIAGNOSIS — Z01818 Encounter for other preprocedural examination: Secondary | ICD-10-CM

## 2016-03-07 DIAGNOSIS — N184 Chronic kidney disease, stage 4 (severe): Secondary | ICD-10-CM | POA: Diagnosis present

## 2016-03-07 DIAGNOSIS — Z452 Encounter for adjustment and management of vascular access device: Secondary | ICD-10-CM

## 2016-03-07 DIAGNOSIS — Z9911 Dependence on respirator [ventilator] status: Secondary | ICD-10-CM

## 2016-03-07 DIAGNOSIS — J9600 Acute respiratory failure, unspecified whether with hypoxia or hypercapnia: Secondary | ICD-10-CM

## 2016-03-07 DIAGNOSIS — Z431 Encounter for attention to gastrostomy: Secondary | ICD-10-CM

## 2016-03-07 DIAGNOSIS — Z0189 Encounter for other specified special examinations: Secondary | ICD-10-CM

## 2016-03-07 DIAGNOSIS — Z931 Gastrostomy status: Secondary | ICD-10-CM

## 2016-03-07 DIAGNOSIS — I479 Paroxysmal tachycardia, unspecified: Secondary | ICD-10-CM

## 2016-03-07 DIAGNOSIS — J9690 Respiratory failure, unspecified, unspecified whether with hypoxia or hypercapnia: Secondary | ICD-10-CM

## 2016-03-07 DIAGNOSIS — Z4659 Encounter for fitting and adjustment of other gastrointestinal appliance and device: Secondary | ICD-10-CM

## 2016-03-07 DIAGNOSIS — J90 Pleural effusion, not elsewhere classified: Secondary | ICD-10-CM

## 2016-03-07 DIAGNOSIS — I2510 Atherosclerotic heart disease of native coronary artery without angina pectoris: Secondary | ICD-10-CM | POA: Diagnosis present

## 2016-03-07 DIAGNOSIS — T85598A Other mechanical complication of other gastrointestinal prosthetic devices, implants and grafts, initial encounter: Secondary | ICD-10-CM

## 2016-03-07 DIAGNOSIS — I5031 Acute diastolic (congestive) heart failure: Secondary | ICD-10-CM | POA: Diagnosis present

## 2016-03-07 LAB — BODY FLUID CELL COUNT WITH DIFFERENTIAL
EOS FL: 3 %
LYMPHS FL: 74 %
MONOCYTE-MACROPHAGE-SEROUS FLUID: 15 % — AB (ref 50–90)
Neutrophil Count, Fluid: 8 % (ref 0–25)
Total Nucleated Cell Count, Fluid: 273 cu mm (ref 0–1000)

## 2016-03-07 LAB — RENAL FUNCTION PANEL
Albumin: 1.9 g/dL — ABNORMAL LOW (ref 3.5–5.0)
Anion gap: 6 (ref 5–15)
BUN: 66 mg/dL — AB (ref 6–20)
CALCIUM: 8.7 mg/dL — AB (ref 8.9–10.3)
CHLORIDE: 102 mmol/L (ref 101–111)
CO2: 35 mmol/L — AB (ref 22–32)
CREATININE: 3.94 mg/dL — AB (ref 0.61–1.24)
GFR, EST AFRICAN AMERICAN: 15 mL/min — AB (ref >60)
GFR, EST NON AFRICAN AMERICAN: 13 mL/min — AB (ref >60)
Glucose, Bld: 197 mg/dL — ABNORMAL HIGH (ref 65–99)
Phosphorus: 4 mg/dL (ref 2.5–4.6)
Potassium: 5.3 mmol/L — ABNORMAL HIGH (ref 3.5–5.1)
SODIUM: 143 mmol/L (ref 135–145)

## 2016-03-07 LAB — LACTATE DEHYDROGENASE, PLEURAL OR PERITONEAL FLUID: LD FL: 66 U/L — AB (ref 3–23)

## 2016-03-07 LAB — GRAM STAIN

## 2016-03-07 LAB — CBC
HCT: 30.5 % — ABNORMAL LOW (ref 39.0–52.0)
Hemoglobin: 9.2 g/dL — ABNORMAL LOW (ref 13.0–17.0)
MCH: 29.4 pg (ref 26.0–34.0)
MCHC: 30.2 g/dL (ref 30.0–36.0)
MCV: 97.4 fL (ref 78.0–100.0)
PLATELETS: 194 10*3/uL (ref 150–400)
RBC: 3.13 MIL/uL — AB (ref 4.22–5.81)
RDW: 16.7 % — AB (ref 11.5–15.5)
WBC: 6.7 10*3/uL (ref 4.0–10.5)

## 2016-03-07 LAB — PROTEIN, BODY FLUID: Total protein, fluid: <3 g/dL

## 2016-03-07 LAB — HEPARIN LEVEL (UNFRACTIONATED): HEPARIN UNFRACTIONATED: 0.44 [IU]/mL (ref 0.30–0.70)

## 2016-03-07 LAB — GLUCOSE, SEROUS FLUID: GLUCOSE FL: 139 mg/dL

## 2016-03-07 LAB — ALBUMIN, FLUID (OTHER)

## 2016-03-07 MED ORDER — PRO-STAT SUGAR FREE PO LIQD: 30.0000 mL | Freq: Two times a day (BID) | ORAL | 0 refills | Status: AC

## 2016-03-07 MED ORDER — METOPROLOL TARTRATE 25 MG PO TABS: 12.5000 mg | ORAL_TABLET | Freq: Two times a day (BID) | ORAL | 0 refills | Status: AC

## 2016-03-07 MED ORDER — ENSURE ENLIVE PO LIQD: 237.0000 mL | Freq: Two times a day (BID) | ORAL | 12 refills | Status: AC

## 2016-03-07 MED ORDER — ASPIRIN 81 MG PO TBEC
81.0000 mg | DELAYED_RELEASE_TABLET | Freq: Every day | ORAL | 0 refills | Status: AC
Start: 2016-03-07 — End: ?

## 2016-03-07 NOTE — Discharge Summary (Signed)
Discharge Summary  [name] [name] ZOX:[id number] DOB: 1928-02-24  PCP: Lindwood Qua, MD  Admit date: 03/01/2016 Discharge date: 01-Apr-2016  Time spent: >72mins  Patient is transferred to [facility] continue aggressive treatment  Discharge Diagnoses:  Active Hospital Problems   Diagnosis Date Noted  . Acute diastolic (congestive) heart failure (HCC) 03/01/2016  . CHF (congestive heart failure) (HCC)   . Pneumothorax   . Pressure ulcer 03/02/2016  . NSTEMI (non-ST elevated myocardial infarction) (HCC) 03/02/2016  . Anemia 03/02/2016  . Acute heart disease   . Type II diabetes mellitus (HCC)   . Malnutrition (HCC)   . C. difficile colitis   . Encounter for palliative care   . Goals of care, counseling/discussion   . Chronic kidney disease (CKD), stage IV (severe) (HCC)   . Acute confusional state   . Essential hypertension 12/11/2008    Resolved Hospital Problems   Diagnosis Date Noted Date Resolved  No resolved problems to display.    Discharge Condition: stable on cardizem drip and heparin drip  Diet recommendation: heart healthy/renal diet  Diet recommendations: Dysphagia 1 (puree);Nectar-thick liquid Liquids provided via: Cup;No straw Medication Administration: Whole meds with puree Supervision: Full supervision/cueing for compensatory strategies Compensations: Minimize environmental distractions;Slow rate;Small sips/bites;Follow solids with liquid Postural Changes and/or Swallow Maneuvers: Seated upright 90 degrees;Upright 30-60 min after meal   Filed Weights   03/04/16 0600 03/05/16 0406 03/06/16 0416  Weight: 68 kg (150 lb) 68 kg (150 lb) 64 kg (141 lb)    History of present illness:  [name] [name] is 82 y o with a aPMH of CDA s/p CABG, Afib, CKD IV who presents from OSH ED with AMS and volume overload and NSTEMI. He has been admitted to [facility] for 1 m in August for [facility] colitis. Course was complicated by acidosis, pleural effusion and toxic encephalopathy. He  left for rehab on a vancomycin titer less than one week ago. At rehab the wife noted that the patient was "off" yesterday. She also noted him to be puffy the first time few days ago. Apparently he wasn't given his lasix at the rehab facility up until a few days ago at the wife's request.  She noted him to be SOB as well. No PND or orthopnea. His confusion remained unchanged.   At the ED he was noted ot be grossly fluid up, confused (thus history needed to be obtained from his wife), HIs BNP <20.000 and his trop was 0.1. ECG without acute changes but in Afib with RVR with soft BPs (100 syst). He was given 60mg  iv lasix total and transferred to [facility].   Patient is to transfer to [facility] on 9/12:  On cardizem drip and heparin drip patient is very frail, lower extremity edema has completely resolved, today on 9/12, he seems to be more verbal, he replied yes to cpr/defibrillator/intubation when asked , two RN Dawn and Gwin in room witness the conversation, he also replies he will try to eat more.   Hospital Course:  Principal Problem:   Acute diastolic (congestive) heart failure (HCC) Active Problems:   Essential hypertension   Encounter for palliative care   Goals of care, counseling/discussion   Chronic kidney disease (CKD), stage IV (severe) (HCC)   Acute confusional state   Acute heart disease   Pressure ulcer   NSTEMI (non-ST elevated myocardial infarction) (HCC)   Type II diabetes mellitus (HCC)   Malnutrition (HCC)   C. difficile colitis   Anemia   CHF (congestive heart failure) (HCC)  Pneumothorax   Acute diastolic heart failure in the setting of NSTEMI, afib with RVR andprotein malnutrition.   -Echo with normal lvef, not able to evaluate diastolic function due to afib - Evaluated by the heart failure team who recommend he is at end-of-life and not candidate for advanced therapiesLikely he'll live 3-6 months. Did recommend diuresing and trying to improve his nutrition and strength as  well as palliative care consult - he was on a Lasix drip, now iv lasix, -continue BB if bp allows -cardiology signed off on 9/8, reconsulted on 9/10 He is to transfer to [facility] on 9/12, further lasix dose adjustment per LTAC MD.   Ex vacuo Pneumothorax s/p thoracentesis on 9/7:  - Thoracic surgery states patient is not a candidate for surgery, recommended IR for chest tube placement which was placed on 9/8 by IR. - I have discussed the case with IR Dr Loreta Ave who states plan to remove chest tube once no airleak, then allow re accumulation of pleural fluids and avoid tapping pleural effusion in the future, unless patient is significant symptomatic from it. Pulmonary/critical care also recommend avoid future thoracentesis as well, please see pccm /IR and thoracic surgery note for details. -chest tube removed on 9/12, please follow up on final fluids studies/cytology result.   NSTEMI. Patient was admitted to cardiology service on 9/6, TRH took over on 9/7. Cardiology H&P notes indicate NSTEMI likely type 2 event in setting of HF and afib with RVR. EKG with afib and HB. Troponin stable. Patient denies pain. Patient started on a heparin drip on admission. Chart review indicates intermittent episodes of 10-20 beat runs of Vtach.  -cardiology recommended palliative care consult and signed off. reconculted on 9/10 Patient is currently on lopressor, asa, heparin drip for afib, patient does not have chest pain, troponin flat. His ldl is 58.    A. fib with RVR.  [place] vasc score 5.- -cardiology started patient on cardizem drip and heparin drip, he is also on low dose betablocker, goal to transition to oral meds once patient is able to have consistant oral intake -currently on lopressor, cardizem drip and heparin drip, LTAC MD to continue management and consider cardiology consult while at [facility].  Acute on chronic kidney disease stage III to 4. Creatinine 3.92 trending down from 4.2 on admission. This  appears to be closer to his baseline. Evaluated by nephrology who opined current function not far from his baseline per his note. Note also indicates he is a poor candidate for hemodialysis and recommend palliative care consult, nephrology signed off.   Acute vs subacute confusional state. Likely related to all above.   -B12 and folate and rpr wnl -Advance diet per speech therapy recommendation, continue aspiration precaution. -if fail to improve, may consider alternative nutrition. Family currently want to wait to see if patient can eat by himself.  Anemia. Likely of chronic disease/mlanutrition-+/-chronic GI loss.  No signs of obvious bleeding. -fobt pending -anemia panel b12/folate/iron unremarkable --consider transfusion when indicated  C. difficile. Patient in the hospital for the entire month of August for same. Was discharged to rehabilitation on oral bank. Ears stable. -Continue by mouth Vanco, slow taper, last dose 10/1. -precautions -stool frequency decreased, pasty in texture on 9/12.  Severe protein malnutrition/ deconditioning.  Patient has a prolonged hospitalization in august then was in a SNF prior to this admission. Chart review indicates patient has had slow decline since 2015 with issues pertaining to mount nutrition and worsening chronic kidney disease. 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 Since then he mostly stay in chair or in bed during the day time until [facility]. Of note heart failure team opines edema more likely related to low protein versus #1. Albumin2.1, of note Palliative Care consulted and note indicates family with unrealistic expectations. -Nutritional consult -Physical therapy consult -Advance diet per recommendations of speech therapy -patient is to discharge to LTAC, need to continue to discuss with family about nutrition needs, family  want to wait to see if patient can improve enough to have adequate po intake.   Hypertension. History of same. Blood pressure quite soft but improving during this hospitalization. Medications include metoprolol Lasix. -Continue iv Lasix  as noted above -Beta blocker  -he is currently on cardiazem drip, cardiology recommended to transition to oral meds if patient taker oral consistently  FTT , poor prognosis, palliative care consulted, goal of care discussion on going. Family want LTAC placement, continue aggressive intervention and patient remain full code.  DVT prophylaxis: heparin drip Code Status:full. Several providers and Palliative care have discussed poor prognosis with family. Family wish for patient to remain full code Family Communication:i spent more than 40mins on 9/9 to talk to patient's son mark and Silverio in conference room, explained the over all poor prognosis, son insist on continue aggressive treatment .he has not make  decision about alternative nutrition, family did not meet with palliative care, [facility]  Consultants:  Nephrology coladonato ,signed off  Thoracic surgery, signed off  Critical care consulted on 9/8 due to patient is at risk of respiratory failure and hemodynamic instability and family insists on being full code. Critical care indicating patient is not a candidate for intubation, critical care signed off on 9/11  Interventional radiology, signed off  Patient was admitted to cardiology service, transferred to hospitalist service after Cardiology signed off, cardiology reconsulted on 9/10 and signed off on 9/11   Palliative care consulted on 9/6, requested reconsult on 9/8               Procedures: thoracentesis, chest tube placement  Antimicrobials: oral vancomycin per pharmacy   Discharge Exam: BP 121/72 (BP Location: Right Arm)   Pulse 86   Temp 98 F (36.7 C) (Oral)   Resp 12   Ht  5\' 9"  (1.753 m)   Wt 64 kg (141 lb)   SpO2 100%   BMI 20.82 kg/m     General:  Chronically ill, very frail, say a few words  Cardiovascular: IRRR  Respiratory: diminished on the left, good air movement on the right, chest tube on left has removed.  Abdomen: Soft/ND/NT, positive BS  Musculoskeletal: pitting Edema bilateral lower extremities has almost resolved on exam on 9/11.  Neuro: drowsy, not oriented to time or place   Discharge Instructions You were cared for by a hospitalist during your hospital stay. If you have any questions about your discharge medications or the care you received while you were in the hospital after you are discharged, you can call the unit and asked to speak with the hospitalist on call if the hospitalist that took care of you is not available. Once you are discharged, your primary care physician will handle any further medical issues. Please note that NO REFILLS for any discharge medications will be authorized once you are discharged, as it is imperative that you return  to your primary care physician (or establish a relationship with a primary care physician if you do not have one) for your aftercare needs so that they can reassess your need for medications and monitor your lab values.  Discharge Instructions    Diet - low sodium heart healthy    Complete by:  As directed   Renal diet, puree diet, thickened liquid, continue aspiration precaution, continue speech therapy   Increase activity slowly    Complete by:  As directed       Medication List    STOP taking these medications   LASIX 40 MG tablet Generic drug:  furosemide   metoprolol succinate 50 MG 24 hr tablet Commonly known as:  TOPROL-XL   mirtazapine 15 MG tablet Commonly known as:  REMERON   risperiDONE 0.25 MG tablet Commonly known as:  RISPERDAL   vancomycin 250 MG capsule Commonly known as:  VANCOCIN     TAKE these medications   acetaminophen 650 MG CR tablet Commonly known  as:  TYLENOL Take 650 mg by mouth every 4 (four) hours as needed for pain.   ALIGN 4 MG Caps Take 4 mg by mouth daily.   aspirin 81 MG EC tablet Take 1 tablet (81 mg total) by mouth daily.   escitalopram 10 MG tablet Commonly known as:  LEXAPRO Take 10 mg by mouth daily.   EYE VITAMINS Caps Take 1 capsule by mouth daily.   feeding supplement (ENSURE ENLIVE) Liqd Take 237 mLs by mouth 2 (two) times daily between meals.   feeding supplement (PRO-STAT SUGAR FREE 64) Liqd Take 30 mLs by mouth 2 (two) times daily.   metoprolol tartrate 25 MG tablet Commonly known as:  LOPRESSOR Take 0.5 tablets (12.5 mg total) by mouth 2 (two) times daily.   multivitamin Tabs tablet Take 1 tablet by mouth daily.   NITROSTAT 0.4 MG SL tablet Generic drug:  nitroGLYCERIN DISSOLVE ONE TABLET UNDER THE TONGUE EVERY 5 MINUTES AS NEEDED FOR CHEST PAIN.  DO NOT EXCEED A TOTAL OF 3 DOSES IN 15 MINUTES What changed:  how much to take  how to take this  when to take this  reasons to take this  additional instructions   sodium bicarbonate 650 MG tablet Take 1,300 mg by mouth 2 (two) times daily.      Allergies  Allergen Reactions  . Statins Other (See Comments)    Per MAR at [facility]      The results of significant diagnostics from this hospitalization (including imaging, microbiology, ancillary and laboratory) are listed below for reference.    Significant Diagnostic Studies: Dg Chest 1 View  Result Date: 03/02/2016 CLINICAL DATA:  Left side pleural effusion. Post Thorocentesis on the left. Hx of CAD, HTN, NSTEMI, Pneumonia, DM2 EXAM: CHEST  1 VIEW COMPARISON:  Earlier film of the same day FINDINGS: Moderate left lateral and apical pneumothorax, without midline shift. Resolution of pleural effusion. Degree of left lung inflation is slightly improved from prior exam. Probable layering right pleural effusion as before with stable opacities in the mid and lower right lung. Heart  size upper limits normal.  Previous CABG. IMPRESSION: 1. Moderate left pleural effusion post thoracentesis, with no significant residual effusion, and overall slightly improved aeration of the left lung. Electronically Signed   By: Corlis Leak M.D.   On: 03/02/2016 16:30   Dg Chest 2 View  Result Date: 03/02/2016 CLINICAL DATA:  Left-sided pleural effusion EXAM: CHEST  2 VIEW COMPARISON:  03/01/2016  FINDINGS: Cardiac shadow is stable. Postoperative changes are again seen. A large left pleural effusion is again noted. It is at least partially mobile. The small right effusion also appears somewhat mobile. IMPRESSION: Bilateral pleural effusions left greater than right with mobile components bilaterally. Degree of central vascular congestion appears somewhat worse than that seen on the prior exam. Electronically Signed   By: Alcide Clever M.D.   On: 03/02/2016 12:59   Ir Chest Fluoro  Result Date: 03/03/2016 INDICATION: 82 year old male with a history of pneumothorax EXAM: CHEST FLUOROSCOPY MEDICATIONS: The patient is currently admitted to the hospital and receiving intravenous antibiotics. The antibiotics were administered within an appropriate time frame prior to the initiation of the procedure. ANESTHESIA/SEDATION: Fentanyl 25 mcg IV; Versed 0 mg IV COMPLICATIONS: None PROCEDURE: Informed written consent was obtained from the patient after a thorough discussion of the procedural risks, benefits and alternatives. All questions were addressed. Maximal Sterile Barrier Technique was utilized including caps, mask, sterile gowns, sterile gloves, sterile drape, hand hygiene and skin antiseptic. A timeout was performed prior to the initiation of the procedure. Patient positioned supine position on the fluoroscopy table. Left chest was prepped and draped in the usual sterile fashion. Scout image was acquired. The skin and subcutaneous tissues at the anterior axillary line just below the nipple was generously infiltrated  1% lidocaine for local anesthesia. A Yueh needle was advanced under aspiration into the left chest. Catheter was advanced from the needle. A 035 wire was placed through the needle into the chest under fluoroscopy. The catheter was removed. Small incision was made on the wire. It is dilation of the soft tissues was performed and then a 10 French drain was placed with modified Seldinger technique. Pigtail was formed can the drain was sutured in position. Final image was stored. The catheter was then attached to a water seal chamber on suction. Patient tolerated the procedure well and remained hemodynamically stable throughout. No complications were encountered and no significant blood loss encountered. IMPRESSION: Status post left thoracostomy tube placement. Signed, Yvone Neu. Loreta Ave, DO [facility] Electronically Signed   By: Gilmer Mor D.O.   On: 03/03/2016 15:21   Dg Chest Port 1 View  Result Date: 03/12/2016 CLINICAL DATA:  Follow-up pneumothorax.  Chest tube removal. EXAM: PORTABLE CHEST 1 VIEW COMPARISON:  None 04/2016 FINDINGS: Interval removal of left pigtail drainage catheter from the left pleural space. Large left pneumothorax again noted with near complete collapse of the left lung. Moderate sized bilateral pleural effusions. Prior CABG. Heart is borderline in size. Diffuse airspace disease throughout the right lung and remaining aerated left upper lobe, likely edema. IMPRESSION: Interval removal of the pigtail catheter. Large left pneumothorax, slightly increased since prior study with near complete collapse of the left lung. Moderate bilateral pleural effusions. Diffuse airspace disease, likely edema. Critical Value/emergent results were called by telephone at the time of interpretation on 03/23/2016 at 11:47 am to Dr. Maryclare Bean , who verbally acknowledged these results. Electronically Signed   By: Charlett Nose M.D.   On: 03/14/2016 11:47    Dg Chest Port 1 View  Result Date: 03/06/2016 CLINICAL DATA:  Left-sided pneumothorax with chest tube drainage. EXAM: PORTABLE CHEST 1 VIEW COMPARISON:  Portable chest x-ray of March 05, 2016 FINDINGS: An approximately 60% pneumothorax on the left is present and more conspicuous than on the previous study. There is pleural fluid layering posteriorly in the left pleural space that appears stable. The small caliber  left chest tube is in stable position with the pigtail overlying the posterior medial aspects of the fifth and sixth ribs. On the right there is pleural fluid layering posteriorly which is more conspicuous today. The heart is top-normal in size. The pulmonary vascularity remains engorged. There previous CABG changes. There is calcification in the wall of the aortic arch. IMPRESSION: Mild interval increase in the size of the left pleural effusion such that it is approximately 60% of the lung volume. Considerable pleural fluid remains on the left and a pleural effusion on the right is more conspicuous today. These results were called by telephone at the time of interpretation on 03/06/2016 at 3:41 pm to [facility], RN,, who verbally acknowledged these results. Electronically Signed   By: David  [place] M.D.   On: 03/06/2016 15:44   Dg Chest Port 1 View  Result Date: 03/05/2016 CLINICAL DATA:  Left pneumothorax. EXAM: PORTABLE CHEST 1 VIEW COMPARISON:  Radiograph of March 03, 2016. FINDINGS: Stable cardiomediastinal silhouette. Status post coronary artery bypass graft. Atherosclerosis of thoracic aorta is noted. Large left-sided pneumothorax is again noted which is unchanged compared to prior exam. Left-sided chest tube is again noted. Stable right diffuse lung interstitial densities are noted concerning for edema. Stable right pleural effusion is noted. Increased left pleural effusion is noted. IMPRESSION: Aortic atherosclerosis. Stable large left-sided pneumothorax with chest tube in place.  Increased left pleural effusion is noted. Diffuse right lung densities are noted concerning for edema or possibly pneumonia with associated pleural effusion. Electronically Signed   By: Lupita Raider, M.D.   On: 03/05/2016 09:47   Dg Chest Port 1 View  Result Date: 03/03/2016 CLINICAL DATA:  Follow-up left pneumothorax EXAM: PORTABLE CHEST 1 VIEW COMPARISON:  Chest radiograph from earlier today. FINDINGS: Interval placement of left basilar chest tube with the distal pigtail portion overlying the medial basilar left pleural space. Sternotomy wires appear aligned and intact. CABG clips overlie the mediastinum. Stable cardiomediastinal silhouette with mild cardiomegaly. Moderate left pneumothorax is decreased. No right pneumothorax. No mediastinal shift. Small right pleural effusion, increased. No left pleural effusion. Stable mild pulmonary edema. Improved aeration in the left lung with persistent patchy opacity in the mid to lower left lung. IMPRESSION: 1. Persistent moderate left pneumothorax, decreased. No mediastinal shift. 2. Improved aeration of the left lung with persistent nonspecific patchy opacity in the mid to lower left lung. 3. Small right pleural effusion, increased. 4. Stable mild congestive heart failure. Electronically Signed   By: Delbert Phenix M.D.   On: 03/03/2016 16:44   Dg Chest Port 1 View  Result Date: 03/03/2016 CLINICAL DATA:  Short of breath EXAM: PORTABLE CHEST 1 VIEW COMPARISON:  03/02/2016 FINDINGS: Sternotomy wires overlie normal cardiac silhouette. Large LEFT pneumothorax again demonstrated. The LEFT upper pleural edge measures 48 mm mid chest wall increased from 27 mm. The lateral pleural edge measures 47 mm compared to 44 remeasured. Minimal expansion of the LEFT lung. Interval increase in LEFT effusion compared to 1 day prior. No clear expansion of the LEFT lung. IMPRESSION: 1. Large LEFT pneumothorax measures slightly larger than comparison exam. Note mediastinal shift. 2. No  expansion of the LEFT lung. 3. Interval reaccumulation of LEFT pleural fluid. 4. Stable RIGHT effusion These results will be called to the ordering clinician or representative by the Radiologist Assistant, and communication documented in the PACS or zVision Dashboard. Electronically Signed   By: Genevive Bi M.D.   On: 03/03/2016 08:59   Dg Chest Port 1  View  Result Date: 03/02/2016 CLINICAL DATA:  Follow-up pneumothorax EXAM: PORTABLE CHEST 1 VIEW COMPARISON:  9/7/ 17 at 16:25 FINDINGS: Cardiomediastinal silhouette is stable. Status post CABG again noted. Again noted moderate left upper and left lateral pneumothorax with atelectasis in left midlung in left lower lobe. The lateral component measures 55 mm thickness with slight progression from prior exam when measures 50 mm thickness. Stable right pleural effusion with right basilar atelectasis. IMPRESSION: Again noted moderate left upper and left lateral pneumothorax with atelectasis in left midlung in left lower lobe. The lateral component measures 55 mm thickness with slight progression from prior exam when measured 50 mm thickness. Stable right pleural effusion with right basilar atelectasis. Electronically Signed   By: Natasha Mead M.D.   On: 03/02/2016 18:36   Dg Chest Port 1 View  Result Date: 03/01/2016 CLINICAL DATA:  Short of breath EXAM: PORTABLE CHEST 1 VIEW COMPARISON:  02/29/2016 FINDINGS: Two sternotomy wires overlies normal cardiac silhouette. Bilateral pleural effusions greater on the LEFT. No pneumothorax. Central venous congestion. IMPRESSION: 1. No significant change. 2. Central venous congestion and bilateral pleural effusion. Moderate to large effusion on the LEFT. Electronically Signed   By: Genevive Bi M.D.   On: 03/01/2016 12:07   US Thoracentesis Asp Pleural Space W/img Guide  Result Date: 03/02/2016 INDICATION: Shortness of breath. Chronic kidney disease stage 4. Large left pleural effusion. Request for therapeutic  thoracentesis. EXAM: ULTRASOUND GUIDED LEFT THORACENTESIS MEDICATIONS: 1% Lidocaine. COMPLICATIONS: None immediate. PROCEDURE: An ultrasound guided thoracentesis was thoroughly discussed with the patient and questions answered. The benefits, risks, alternatives and complications were also discussed. The patient understands and wishes to proceed with the procedure. Written consent was obtained. Ultrasound was performed to localize and mark an adequate pocket of fluid in the left chest. The area was then prepped and draped in the normal sterile fashion. 1% Lidocaine was used for local anesthesia. Under ultrasound guidance a 6 Fr Safe-T-Centesis catheter was introduced. Thoracentesis was performed. The catheter was removed and a dressing applied. FINDINGS: A total of approximately 1.7 liters of clear light orange fluid was removed. IMPRESSION: Successful ultrasound guided left thoracentesis yielding 1.7 liters of pleural fluid. Read by:  Corrin Parker, PA-C Electronically Signed   By: Corlis Leak M.D.   On: 03/02/2016 16:10    Microbiology: Recent Results (from the past 240 hour(s))  MRSA PCR Screening     Status: None   Collection Time: 03/01/16  1:00 AM  Result Value Ref Range Status   MRSA by PCR NEGATIVE NEGATIVE Final    Comment:        The GeneXpert MRSA Assay (FDA approved for NASAL specimens only), is one component of a comprehensive MRSA colonization surveillance program. It is not intended to diagnose MRSA infection nor to guide or monitor treatment for MRSA infections.   Gram stain     Status: None   Collection Time: 03/24/2016 10:20 AM  Result Value Ref Range Status   Specimen Description FLUID PLEURAL  Final   Special Requests NONE  Final   Gram Stain   Final    FEW WBC PRESENT,BOTH PMN AND MONONUCLEAR NO ORGANISMS SEEN    Report Status 03/21/2016 FINAL  Final     Labs: Basic Metabolic Panel:  Recent Labs Lab 03/02/16 0342 03/03/16 0521 03/04/16 0504 03/05/16 0621  03/06/16 0321 02/29/2016 0500  NA 141 141 141 142 143 143  K 3.2* 3.2* 4.1 4.6 5.4* 5.3*  CL 108 106 107 107 109 102  CO2 24 25 29 26 26  35*  GLUCOSE 72 92 150* 113* 141* 197*  BUN 50* 50* 51* 57* 64* 66*  CREATININE 3.92* 4.02* 4.08* 4.09* 3.94* 3.94*  CALCIUM 7.7* 7.9* 8.0* 8.1* 8.0* 8.7*  MG 1.8  --   --  1.8  --   --   PHOS 4.8* 5.0* 5.5* 4.6 3.9 4.0   Liver Function Tests:  Recent Labs Lab 03/01/16 0151  03/03/16 0521 03/04/16 0504 03/05/16 0621 03/06/16 0321 2016/03/14 0500  AST 29  --   --  20  --   --   --   ALT 18  --   --  14*  --   --   --   ALKPHOS 88  --   --  78  --   --   --   BILITOT 0.8  --   --  0.7  --   --   --   PROT 5.6*  --   --  4.8*  --   --   --   ALBUMIN 2.1*  < > 1.8* 1.8*  1.8* 1.9* 1.9* 1.9*  < > = values in this interval not displayed.  Recent Labs Lab 03/04/16 0504  LIPASE 27    Recent Labs Lab 03/01/16 1035  AMMONIA 23   CBC:  Recent Labs Lab 03/01/16 0151 03/02/16 0342 03/03/16 0521 03/04/16 0504 03/06/16 0321 14-Mar-2016 1003  WBC 7.1 5.9 5.5 8.0 9.4 6.7  NEUTROABS 4.9  --   --   --   --   --   HGB 9.6* 8.3* 9.6* 9.4* 9.8* 9.2*  HCT 30.8* 26.8* 30.2* 30.4* 32.9* 30.5*  MCV 93.3 93.7 93.2 95.6 98.5 97.4  PLT 182 162 171 164 168 194   Cardiac Enzymes:  Recent Labs Lab 03/01/16 0151 03/01/16 0754 03/01/16 1239  TROPONINI 0.15* 0.15* 0.15*   BNP: BNP (last 3 results) No results for input(s): BNP in the last 8760 hours.  ProBNP (last 3 results) No results for input(s): PROBNP in the last 8760 hours.  CBG:  Recent Labs Lab 03/06/16 2106  GLUCAP 234*       SignedAlbertine Grates MD, PhD  [facility] Mar 14, 2016, 5:24 PM

## 2016-03-07 NOTE — Progress Notes (Signed)
ANTICOAGULATION CONSULT NOTE  Pharmacy Consult for Heparin  Indication: atrial fibrillation  Assessment: 82 yo male, admitted for AMS on 9/7, originally anticoagulated with heparin for NSTEMI/Afib, but was discontinued and changed to lovenox at DVT prophylactic dose as pt was not a candidate for advanced therapies due to poor prognosis. Family now wishes for aggressive medical management. Pharmacy consulted to restart heparin for afib.   Heparin level remains at goal on 1000 units/hr. No bleeding issues have been noted. Hgb low stable, pltc low but wnl, SCr ~4 stable.   This patients CHA2DS2-VASc Score and unadjusted Ischemic Stroke Rate (% per year) is equal to 7.2 % stroke rate/year from a score of 5  Continues in AFib today.  Above score calculated as 1 point each if present [CHF, HTN, DM, Vascular=MI/PAD/Aortic Plaque, Age if 65-74, or Male] Above score calculated as 2 points each if present [Age > 75, or Stroke/TIA/TE]  Awaiting on cardiology recommendations regarding oral anticoagulation.  Goal of Therapy:  Heparin level 0.3-0.7 units/ml Monitor platelets by anticoagulation protocol: Yes   Plan:  Continue heparin infusion at 1000 units/hr Check anti-Xa level daily while on heparin Continue to monitor H&H and platelets   Allergies  Allergen Reactions  . Statins Other (See Comments)    [facility]    Patient Measurements: Height: 5\' 9"  (175.3 cm) Weight: 141 lb (64 kg) IBW/kg (Calculated) : 70.7  Vital Signs: Temp: 98 F (36.7 C) (09/12 1226) Temp Source: Oral (09/12 1226) BP: 121/72 (09/12 0753) Pulse Rate: 86 (09/12 0753)  Labs:  Recent Labs  03/05/16 0621 03/05/16 2155 03/06/16 0321 03/06/2016 0500 03/06/2016 1003  HGB  --   --  9.8*  --  9.2*  HCT  --   --  32.9*  --  30.5*  PLT  --   --  168  --  194  HEPARINUNFRC  --  0.40 0.44 0.44  --   CREATININE 4.09*  --  3.94* 3.94*  --     Estimated Creatinine Clearance: 12.2 mL/min (by  C-G formula based on SCr of 3.94 mg/dL).   Medical History: Past Medical History:  Diagnosis Date  . Arthritis   . Bilateral inguinal hernia (BIH), left scrotal 11/28/2011  . C. difficile colitis   . CAD    CABG x 3 in 1997 by Dr. Laneta SimmersBartle; PCI in 2009  . HYPERLIPIDEMIA   . HYPERTENSION   . Malnutrition (HCC)   . NSTEMI (non-ST elevated myocardial infarction) (HCC)   . Pneumonia 1943  . RENAL INSUFFICIENCY   . Type II diabetes mellitus (HCC)     Sheppard CoilFrank Coren Crownover PharmD., BCPS Clinical Pharmacist Pager [phone] 03/01/2016 1:32 PM

## 2016-03-07 NOTE — Telephone Encounter (Signed)
Attempt call with # provided-invalid number.  Attempt call with home # on chart-no answer, unable to leave VM, VM box full.

## 2016-03-07 NOTE — Progress Notes (Signed)
Speech Language Pathology Treatment: Dysphagia  Patient Details Name: [name] KeelsRichard [name] MRN: [id number] DOB: September 01, 1927 Today's Date: 02/24/2016 Time: [id number] SLP Time Calculation (min) (ACUTE ONLY): 16 min  Assessment / Plan / Recommendation Clinical Impression  Chart review showed pt's intake has been poor. Pt consumed nectar thick liquid with no overt s/s of aspiration. Pt showed oral holding for puree solids, but followed by sips of liquid pt was able to achieve oral clearance. Pt stated he did not want to eat meals because of pain in his buttocks. Recommend continuation of Dys 1 diet and nectar thick liquid. Will f/u for diet advancement and nutritional intake.    HPI HPI:  82 year old gentleman with a past medical history significant for stage IV chronic kidney disease, hypertension, dyslipidemia, diabetes mellitus, coronary artery disease status post CABG 3 in 1997, stents in 2010, history of bilateral inguinal hernia. Patient has had subacute-chronic decline essentially since 2015 with issues pertaining to malnutrition and worsening chronic kidney disease. More recently, patient was hospitalized at [facility] from August 2-09/06/2015 for Clostridium difficile infection, metabolic acidosis, pleural effusion, encephalopathy. The patient was deemed to have improved enough to be transitioned to short-term rehabilitation from [facility] on 02-25-16. It is reported that the patient seemed to have been participating in physical therapy there and was compliant with his medications there however developed confusion and was [facility]  on 03[facility]01-16. Patient has been admitted with working diagnosis of diastolic congestive heart failure exacerbation, possible non-ST segment elevation MI, atrial fibrillation, altered mental status and fluid overload.      SLP Plan  Continue with current plan of care     Recommendations  Diet recommendations: Dysphagia 1  (puree);Nectar-thick liquid Liquids provided via: Cup;No straw Medication Administration: Whole meds with puree Supervision: Full supervision/cueing for compensatory strategies Compensations: Minimize environmental distractions;Slow rate;Small sips/bites;Follow solids with liquid Postural Changes and/or Swallow Maneuvers: Seated upright 90 degrees;Upright 30-60 min after meal             Oral Care Recommendations: Oral care BID Follow up Recommendations: 24 hour supervision/assistance;Skilled Nursing facility Plan: Continue with current plan of care     GO               Tollie EthHaleigh Ragan Leroy Trim, Student SLP  Caryl NeverHaleigh R Alainna Stawicki 02/24/2016, 2:45 PM

## 2016-03-07 NOTE — Progress Notes (Signed)
      [address]       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  There is no air leak so I would remove the pigtail catheter.  He will fill the space with fluid again after tube removed.  He is NOT a candidate for surgical decortication  Salvatore DecentSteven C. Dorris FetchHendrickson, MD [facility] [phone]

## 2016-03-07 NOTE — Progress Notes (Signed)
Chaplain provided pastoral visitation for patient., introduced self as OrthoptistChaplain and patient states "I  Need prayer." Chaplain offered a prayer of healing, and comfort for the patient. Patient"s son arrived and informed him follow up support will continue for hid DAd., he was appreciative of the support. Chaplain Janell QuietAudrey Yared Susan [phone]

## 2016-03-07 NOTE — Progress Notes (Signed)
Patient ID: [name] [name], male   DOB: 09/03/1927, 82 y.o.   MRN: [id number]    Referring Physician(s): Xu,F/Hendrickson,S  Supervising Physician: Dr. Maryclare BeanArt Hoss  Patient Status:  Inpatient  Chief Complaint: Left pleural effusion/left pneumothorax  Subjective: Pt resting in bed; occ moans; Appreciate TCTS input and recs   Allergies: Statins  Medications:  Current Facility-Administered Medications:  .  acetaminophen (TYLENOL) tablet 650 mg, 650 mg, Oral, Q4H PRN, [name], MD, 650 mg at 03/02/16 1818 .  acidophilus (RISAQUAD) capsule 1 capsule, 1 capsule, Oral, Daily, Marat Fudim, MD, 1 capsule at 06/24/16 1025 .  aspirin EC tablet 81 mg, 81 mg, Oral, Daily, Marat Fudim, MD, 81 mg at 06/24/16 1025 .  cholecalciferol (VITAMIN D) tablet 1,000 Units, 1,000 Units, Oral, Daily, [name], MD, 1,000 Units at 06/24/16 1025 .  diltiazem (CARDIZEM) 100 mg in dextrose 5% 100mL (1 mg/mL) infusion, 5-15 mg/hr, Intravenous, Titrated, Laqueta LindenSuresh A Koneswaran, MD, Last Rate: 5 mL/hr at 06/24/16 0136, 5 mg/hr at 06/24/16 0136 .  feeding supplement (ENSURE ENLIVE) (ENSURE ENLIVE) liquid 237 mL, 237 mL, Oral, BID BM, Albertine GratesFang Xu, MD, 237 mL at 03/06/16 1400 .  feeding supplement (PRO-STAT SUGAR FREE 64) liquid 30 mL, 30 mL, Oral, BID, Albertine GratesFang Xu, MD, 30 mL at 06/24/16 1026 .  furosemide (LASIX) 120 mg in dextrose 5 % 50 mL IVPB, 120 mg, Intravenous, BID, Marat Fudim, MD, 120 mg at 06/24/16 0905 .  heparin ADULT infusion 100 units/mL (25000 units/23750mL sodium chloride 0.45%), 1,000 Units/hr, Intravenous, Continuous, Albertine GratesFang Xu, MD, Last Rate: 10 mL/hr at 03/06/16 1800, 1,000 Units/hr at 03/06/16 1800 .  HYDROcodone-acetaminophen (NORCO/VICODIN) 5-325 MG per tablet 1-2 tablet, 1-2 tablet, Oral, Q6H PRN, Roma KayserKatherine P Schorr, NP, 1 tablet at 06/24/16 0704 .  MEDLINE mouth rinse, 15 mL, Mouth Rinse, BID, Richarda OverlieNayana Abrol, MD, 15 mL at 06/24/16 1027 .  metoprolol tartrate (LOPRESSOR) tablet 12.5 mg, 12.5 mg, Oral, BID,  Albertine GratesFang Xu, MD, 12.5 mg at 06/24/16 1025 .  ondansetron (ZOFRAN) injection 4 mg, 4 mg, Intravenous, Q6H PRN, Marat Fudim, MD .  sodium bicarbonate tablet 325 mg, 325 mg, Oral, Daily, Marat Fudim, MD, 325 mg at 06/24/16 1025 .  sodium chloride flush (NS) 0.9 % injection 3 mL, 3 mL, Intravenous, Q12H, Marat Fudim, MD, 3 mL at 06/24/16 1026 .  sodium chloride flush (NS) 0.9 % injection 3 mL, 3 mL, Intravenous, PRN, [name], MD .  [EXPIRED] vancomycin (VANCOCIN) 50 mg/mL oral solution 125 mg, 125 mg, Oral, TID AC & HS, 125 mg at 03/05/16 2106 **FOLLOWED BY** vancomycin (VANCOCIN) 50 mg/mL oral solution 125 mg, 125 mg, Oral, BID, 125 mg at 06/24/16 1025 **FOLLOWED BY** [START ON 03/11/2016] vancomycin (VANCOCIN) 50 mg/mL oral solution 125 mg, 125 mg, Oral, Daily **FOLLOWED BY** [START ON 03/16/2016] vancomycin (VANCOCIN) 50 mg/mL oral solution 125 mg, 125 mg, Oral, Q48H, Marat Fudim, MD .  vancomycin (VANCOCIN) 50 mg/mL oral solution 250 mg, 250 mg, Oral, Once, Marat Fudim, MD .  vitamin C (ASCORBIC ACID) tablet 500 mg, 500 mg, Oral, Daily, Marat Fudim, MD, 500 mg at 06/24/16 1026    Vital Signs: BP 121/72 (BP Location: Right Arm)   Pulse 86   Temp 97.8 F (36.6 C) (Oral)   Resp 12   Ht 5\' 9"  (1.753 m)   Wt 141 lb (64 kg)   SpO2 100%   BMI 20.82 kg/m   Physical Exam  Left chest tube intact, site clean No air leak   Imaging: Ir  Chest Fluoro  Result Date: 03/03/2016 INDICATION: 82 year old male with a history of pneumothorax EXAM: CHEST FLUOROSCOPY MEDICATIONS: The patient is currently admitted to the hospital and receiving intravenous antibiotics. The antibiotics were administered within an appropriate time frame prior to the initiation of the procedure. ANESTHESIA/SEDATION: Fentanyl 25 mcg IV; Versed 0 mg IV COMPLICATIONS: None PROCEDURE: Informed written consent was obtained from the patient after a thorough discussion of the procedural risks, benefits and alternatives. All questions were  addressed. Maximal Sterile Barrier Technique was utilized including caps, mask, sterile gowns, sterile gloves, sterile drape, hand hygiene and skin antiseptic. A timeout was performed prior to the initiation of the procedure. Patient positioned supine position on the fluoroscopy table. Left chest was prepped and draped in the usual sterile fashion. Scout image was acquired. The skin and subcutaneous tissues at the anterior axillary line just below the nipple was generously infiltrated 1% lidocaine for local anesthesia. A Yueh needle was advanced under aspiration into the left chest. Catheter was advanced from the needle. A 035 wire was placed through the needle into the chest under fluoroscopy. The catheter was removed. Small incision was made on the wire. It is dilation of the soft tissues was performed and then a 10 French drain was placed with modified Seldinger technique. Pigtail was formed can the drain was sutured in position. Final image was stored. The catheter was then attached to a water seal chamber on suction. Patient tolerated the procedure well and remained hemodynamically stable throughout. No complications were encountered and no significant blood loss encountered. IMPRESSION: Status post left thoracostomy tube placement. Signed, Yvone Neu. Loreta Ave, DO [facility] Electronically Signed   By: Gilmer Mor D.O.   On: 03/03/2016 15:21   Dg Chest Port 1 View  Result Date: 03/06/2016 CLINICAL DATA:  Left-sided pneumothorax with chest tube drainage. EXAM: PORTABLE CHEST 1 VIEW COMPARISON:  Portable chest x-ray of March 05, 2016 FINDINGS: An approximately 60% pneumothorax on the left is present and more conspicuous than on the previous study. There is pleural fluid layering posteriorly in the left pleural space that appears stable. The small caliber left chest tube is in stable position with the pigtail overlying the posterior medial aspects  of the fifth and sixth ribs. On the right there is pleural fluid layering posteriorly which is more conspicuous today. The heart is top-normal in size. The pulmonary vascularity remains engorged. There previous CABG changes. There is calcification in the wall of the aortic arch. IMPRESSION: Mild interval increase in the size of the left pleural effusion such that it is approximately 60% of the lung volume. Considerable pleural fluid remains on the left and a pleural effusion on the right is more conspicuous today. These results were called by telephone at the time of interpretation on 03/06/2016 at 3:41 pm to [facility], RN,, who verbally acknowledged these results. Electronically Signed   By: David  [place] M.D.   On: 03/06/2016 15:44   Dg Chest Port 1 View  Result Date: 03/05/2016 CLINICAL DATA:  Left pneumothorax. EXAM: PORTABLE CHEST 1 VIEW COMPARISON:  Radiograph of March 03, 2016. FINDINGS: Stable cardiomediastinal silhouette. Status post coronary artery bypass graft. Atherosclerosis of thoracic aorta is noted. Large left-sided pneumothorax is again noted which is unchanged compared to prior exam. Left-sided chest tube is again noted. Stable right diffuse lung interstitial densities are noted concerning for edema. Stable right pleural effusion is noted. Increased left pleural effusion is noted. IMPRESSION: Aortic atherosclerosis. Stable large left-sided  pneumothorax with chest tube in place. Increased left pleural effusion is noted. Diffuse right lung densities are noted concerning for edema or possibly pneumonia with associated pleural effusion. Electronically Signed   By: Lupita Raider, M.D.   On: 03/05/2016 09:47   Dg Chest Port 1 View  Result Date: 03/03/2016 CLINICAL DATA:  Follow-up left pneumothorax EXAM: PORTABLE CHEST 1 VIEW COMPARISON:  Chest radiograph from earlier today. FINDINGS: Interval placement of left basilar chest tube with the distal pigtail portion overlying the medial basilar  left pleural space. Sternotomy wires appear aligned and intact. CABG clips overlie the mediastinum. Stable cardiomediastinal silhouette with mild cardiomegaly. Moderate left pneumothorax is decreased. No right pneumothorax. No mediastinal shift. Small right pleural effusion, increased. No left pleural effusion. Stable mild pulmonary edema. Improved aeration in the left lung with persistent patchy opacity in the mid to lower left lung. IMPRESSION: 1. Persistent moderate left pneumothorax, decreased. No mediastinal shift. 2. Improved aeration of the left lung with persistent nonspecific patchy opacity in the mid to lower left lung. 3. Small right pleural effusion, increased. 4. Stable mild congestive heart failure. Electronically Signed   By: Delbert Phenix M.D.   On: 03/03/2016 16:44    Labs:  CBC:  Recent Labs  03/02/16 0342 03/03/16 0521 03/04/16 0504 03/06/16 0321  WBC 5.9 5.5 8.0 9.4  HGB 8.3* 9.6* 9.4* 9.8*  HCT 26.8* 30.2* 30.4* 32.9*  PLT 162 171 164 168    COAGS:  Recent Labs  03/01/16 0151  INR 1.52    BMP:  Recent Labs  03/03/16 0521 03/04/16 0504 03/05/16 0621 03/06/16 0321  NA 141 141 142 143  K 3.2* 4.1 4.6 5.4*  CL 106 107 107 109  CO2 25 29 26 26   GLUCOSE 92 150* 113* 141*  BUN 50* 51* 57* 64*  CALCIUM 7.9* 8.0* 8.1* 8.0*  CREATININE 4.02* 4.08* 4.09* 3.94*  GFRNONAA 12* 12* 12* 13*  GFRAA 14* 14* 14* 15*    LIVER FUNCTION TESTS:  Recent Labs  03/01/16 0151  03/03/16 0521 03/04/16 0504 03/05/16 0621 03/06/16 0321  BILITOT 0.8  --   --  0.7  --   --   AST 29  --   --  20  --   --   ALT 18  --   --  14*  --   --   ALKPHOS 88  --   --  78  --   --   PROT 5.6*  --   --  4.8*  --   --   ALBUMIN 2.1*  < > 1.8* 1.8*  1.8* 1.9* 1.9*  < > = values in this interval not displayed.  Assessment and Plan: S/p ex vacuo ptx post thoracentesis 9/7 with chest tube placement. Trapped lung. No air leak Tube removed at bedside today without  difficulty.  Post removal CXR essentially unchanged with large left PTX. Expect effusion to recur, no need for thora.   Electronically Signed: Brayton El 03/09/2016, 11:14 AM   I spent a total of 15 minutes at the the patient's bedside AND on the patient's hospital floor or unit, greater than 50% of which was counseling/coordinating care for left chest tube placement

## 2016-03-08 ENCOUNTER — Other Ambulatory Visit (HOSPITAL_COMMUNITY): Payer: Medicare Other

## 2016-03-08 LAB — CBC
HCT: 35.2 % — ABNORMAL LOW (ref 39.0–52.0)
HEMATOCRIT: 32.7 % — AB (ref 39.0–52.0)
HEMOGLOBIN: 10 g/dL — AB (ref 13.0–17.0)
Hemoglobin: 10.6 g/dL — ABNORMAL LOW (ref 13.0–17.0)
MCH: 29.5 pg (ref 26.0–34.0)
MCH: 29.6 pg (ref 26.0–34.0)
MCHC: 30.1 g/dL (ref 30.0–36.0)
MCHC: 30.6 g/dL (ref 30.0–36.0)
MCV: 96.7 fL (ref 78.0–100.0)
MCV: 98.1 fL (ref 78.0–100.0)
PLATELETS: 172 10*3/uL (ref 150–400)
Platelets: 195 10*3/uL (ref 150–400)
RBC: 3.38 MIL/uL — ABNORMAL LOW (ref 4.22–5.81)
RBC: 3.59 MIL/uL — AB (ref 4.22–5.81)
RDW: 16.4 % — AB (ref 11.5–15.5)
RDW: 16.4 % — ABNORMAL HIGH (ref 11.5–15.5)
WBC: 11.2 10*3/uL — AB (ref 4.0–10.5)
WBC: 7.5 10*3/uL (ref 4.0–10.5)

## 2016-03-08 LAB — BASIC METABOLIC PANEL
Anion gap: 8 (ref 5–15)
Anion gap: 10 (ref 5–15)
BUN: 68 mg/dL — ABNORMAL HIGH (ref 6–20)
BUN: 67 mg/dL — ABNORMAL HIGH (ref 6–20)
CALCIUM: 8.8 mg/dL — AB (ref 8.9–10.3)
CALCIUM: 8.8 mg/dL — AB (ref 8.9–10.3)
CO2: 32 mmol/L (ref 22–32)
CO2: 33 mmol/L — ABNORMAL HIGH (ref 22–32)
CREATININE: 3.65 mg/dL — AB (ref 0.61–1.24)
CREATININE: 3.8 mg/dL — AB (ref 0.61–1.24)
Chloride: 102 mmol/L (ref 101–111)
Chloride: 100 mmol/L — ABNORMAL LOW (ref 101–111)
GFR calc non Af Amer: 13 mL/min — ABNORMAL LOW (ref >60)
GFR calc non Af Amer: 14 mL/min — ABNORMAL LOW (ref >60)
GFR, EST AFRICAN AMERICAN: 15 mL/min — AB (ref >60)
GFR, EST AFRICAN AMERICAN: 16 mL/min — AB (ref >60)
Glucose, Bld: 141 mg/dL — ABNORMAL HIGH (ref 65–99)
Glucose, Bld: 134 mg/dL — ABNORMAL HIGH (ref 65–99)
Potassium: 5.3 mmol/L — ABNORMAL HIGH (ref 3.5–5.1)
Potassium: 5.3 mmol/L — ABNORMAL HIGH (ref 3.5–5.1)
SODIUM: 142 mmol/L (ref 135–145)
SODIUM: 143 mmol/L (ref 135–145)

## 2016-03-08 LAB — APTT: APTT: 93 s — AB (ref 24–36)

## 2016-03-08 LAB — PH, BODY FLUID: pH, Body Fluid: 7.9

## 2016-03-08 LAB — PROTIME-INR
INR: 1.18
PROTHROMBIN TIME: 15.1 s (ref 11.4–15.2)

## 2016-03-08 LAB — HEPARIN LEVEL (UNFRACTIONATED)
HEPARIN UNFRACTIONATED: 0.23 [IU]/mL — AB (ref 0.30–0.70)
Heparin Unfractionated: 0.3 IU/mL (ref 0.30–0.70)
Heparin Unfractionated: 0.44 IU/mL (ref 0.30–0.70)

## 2016-03-08 LAB — BRAIN NATRIURETIC PEPTIDE: B Natriuretic Peptide: 206.1 pg/mL — ABNORMAL HIGH (ref 0.0–100.0)

## 2016-03-09 LAB — URINALYSIS, ROUTINE W REFLEX MICROSCOPIC
Bilirubin Urine: NEGATIVE
Glucose, UA: NEGATIVE mg/dL
KETONES UR: NEGATIVE mg/dL
NITRITE: NEGATIVE
PH: 6.5 (ref 5.0–8.0)
Protein, ur: NEGATIVE mg/dL
SPECIFIC GRAVITY, URINE: 1.009 (ref 1.005–1.030)

## 2016-03-09 LAB — TROPONIN I: Troponin I: 0.12 ng/mL (ref <0.03)

## 2016-03-09 LAB — CBC
HEMATOCRIT: 32.4 % — AB (ref 39.0–52.0)
HEMOGLOBIN: 9.7 g/dL — AB (ref 13.0–17.0)
MCH: 29.1 pg (ref 26.0–34.0)
MCHC: 29.9 g/dL — AB (ref 30.0–36.0)
MCV: 97.3 fL (ref 78.0–100.0)
Platelets: 225 10*3/uL (ref 150–400)
RBC: 3.33 MIL/uL — ABNORMAL LOW (ref 4.22–5.81)
RDW: 16.5 % — ABNORMAL HIGH (ref 11.5–15.5)
WBC: 13.1 10*3/uL — ABNORMAL HIGH (ref 4.0–10.5)

## 2016-03-09 LAB — BASIC METABOLIC PANEL
ANION GAP: 10 (ref 5–15)
BUN: 76 mg/dL — ABNORMAL HIGH (ref 6–20)
CO2: 32 mmol/L (ref 22–32)
Calcium: 8.8 mg/dL — ABNORMAL LOW (ref 8.9–10.3)
Chloride: 100 mmol/L — ABNORMAL LOW (ref 101–111)
Creatinine, Ser: 3.68 mg/dL — ABNORMAL HIGH (ref 0.61–1.24)
GFR calc Af Amer: 16 mL/min — ABNORMAL LOW (ref >60)
GFR calc non Af Amer: 14 mL/min — ABNORMAL LOW (ref >60)
GLUCOSE: 114 mg/dL — AB (ref 65–99)
POTASSIUM: 5 mmol/L (ref 3.5–5.1)
Sodium: 142 mmol/L (ref 135–145)

## 2016-03-09 LAB — PROTIME-INR
INR: 1.29
PROTHROMBIN TIME: 16.2 s — AB (ref 11.4–15.2)

## 2016-03-09 LAB — PHOSPHORUS: Phosphorus: 3.5 mg/dL (ref 2.5–4.6)

## 2016-03-09 LAB — URINE MICROSCOPIC-ADD ON

## 2016-03-09 LAB — C DIFFICILE QUICK SCREEN W PCR REFLEX
C Diff antigen: NEGATIVE
C Diff interpretation: NOT DETECTED
C Diff toxin: NEGATIVE

## 2016-03-09 LAB — HEPARIN LEVEL (UNFRACTIONATED): Heparin Unfractionated: 0.26 IU/mL — ABNORMAL LOW (ref 0.30–0.70)

## 2016-03-09 LAB — BRAIN NATRIURETIC PEPTIDE: B Natriuretic Peptide: 231 pg/mL — ABNORMAL HIGH (ref 0.0–100.0)

## 2016-03-09 LAB — MAGNESIUM: Magnesium: 1.7 mg/dL (ref 1.7–2.4)

## 2016-03-10 LAB — BASIC METABOLIC PANEL
Anion gap: 10 (ref 5–15)
BUN: 81 mg/dL — AB (ref 6–20)
CHLORIDE: 97 mmol/L — AB (ref 101–111)
CO2: 33 mmol/L — ABNORMAL HIGH (ref 22–32)
Calcium: 8.9 mg/dL (ref 8.9–10.3)
Creatinine, Ser: 3.9 mg/dL — ABNORMAL HIGH (ref 0.61–1.24)
GFR calc Af Amer: 15 mL/min — ABNORMAL LOW (ref >60)
GFR calc non Af Amer: 13 mL/min — ABNORMAL LOW (ref >60)
Glucose, Bld: 156 mg/dL — ABNORMAL HIGH (ref 65–99)
POTASSIUM: 4.6 mmol/L (ref 3.5–5.1)
SODIUM: 140 mmol/L (ref 135–145)

## 2016-03-10 LAB — CBC
HEMATOCRIT: 31.3 % — AB (ref 39.0–52.0)
HEMOGLOBIN: 9.9 g/dL — AB (ref 13.0–17.0)
MCH: 30 pg (ref 26.0–34.0)
MCHC: 31.6 g/dL (ref 30.0–36.0)
MCV: 94.8 fL (ref 78.0–100.0)
Platelets: 232 10*3/uL (ref 150–400)
RBC: 3.3 MIL/uL — ABNORMAL LOW (ref 4.22–5.81)
RDW: 16.4 % — AB (ref 11.5–15.5)
WBC: 17.1 10*3/uL — AB (ref 4.0–10.5)

## 2016-03-10 LAB — HEPARIN LEVEL (UNFRACTIONATED)
Heparin Unfractionated: <0.1 IU/mL — ABNORMAL LOW (ref 0.30–0.70)
Heparin Unfractionated: 0.37 IU/mL (ref 0.30–0.70)

## 2016-03-10 LAB — PROTIME-INR
INR: 1.26
PROTHROMBIN TIME: 15.9 s — AB (ref 11.4–15.2)

## 2016-03-11 LAB — CBC
HEMATOCRIT: 30.2 % — AB (ref 39.0–52.0)
HEMOGLOBIN: 9.3 g/dL — AB (ref 13.0–17.0)
MCH: 29.3 pg (ref 26.0–34.0)
MCHC: 30.8 g/dL (ref 30.0–36.0)
MCV: 95.3 fL (ref 78.0–100.0)
PLATELETS: 199 10*3/uL (ref 150–400)
RBC: 3.17 MIL/uL — AB (ref 4.22–5.81)
RDW: 16.4 % — AB (ref 11.5–15.5)
WBC: 16.7 10*3/uL — ABNORMAL HIGH (ref 4.0–10.5)

## 2016-03-11 LAB — BASIC METABOLIC PANEL
Anion gap: 12 (ref 5–15)
BUN: 88 mg/dL — ABNORMAL HIGH (ref 6–20)
CHLORIDE: 97 mmol/L — AB (ref 101–111)
CO2: 32 mmol/L (ref 22–32)
CREATININE: 4.22 mg/dL — AB (ref 0.61–1.24)
Calcium: 8.9 mg/dL (ref 8.9–10.3)
GFR calc non Af Amer: 12 mL/min — ABNORMAL LOW (ref >60)
GFR, EST AFRICAN AMERICAN: 13 mL/min — AB (ref >60)
GLUCOSE: 97 mg/dL (ref 65–99)
Potassium: 4 mmol/L (ref 3.5–5.1)
Sodium: 141 mmol/L (ref 135–145)

## 2016-03-11 LAB — URINE CULTURE: Culture: >=100000 — AB

## 2016-03-11 LAB — PROTIME-INR
INR: 1.58
Prothrombin Time: 19 seconds — ABNORMAL HIGH (ref 11.4–15.2)

## 2016-03-11 LAB — HEPARIN LEVEL (UNFRACTIONATED)
HEPARIN UNFRACTIONATED: 0.14 [IU]/mL — AB (ref 0.30–0.70)
Heparin Unfractionated: 0.27 IU/mL — ABNORMAL LOW (ref 0.30–0.70)
Heparin Unfractionated: 0.23 IU/mL — ABNORMAL LOW (ref 0.30–0.70)

## 2016-03-12 LAB — BASIC METABOLIC PANEL
Anion gap: 13 (ref 5–15)
BUN: 95 mg/dL — AB (ref 6–20)
CALCIUM: 8.7 mg/dL — AB (ref 8.9–10.3)
CHLORIDE: 99 mmol/L — AB (ref 101–111)
CO2: 29 mmol/L (ref 22–32)
CREATININE: 4.4 mg/dL — AB (ref 0.61–1.24)
GFR, EST AFRICAN AMERICAN: 13 mL/min — AB (ref >60)
GFR, EST NON AFRICAN AMERICAN: 11 mL/min — AB (ref >60)
Glucose, Bld: 199 mg/dL — ABNORMAL HIGH (ref 65–99)
Potassium: 3.9 mmol/L (ref 3.5–5.1)
SODIUM: 141 mmol/L (ref 135–145)

## 2016-03-12 LAB — CBC
HCT: 30.7 % — ABNORMAL LOW (ref 39.0–52.0)
Hemoglobin: 9.5 g/dL — ABNORMAL LOW (ref 13.0–17.0)
MCH: 29.2 pg (ref 26.0–34.0)
MCHC: 30.9 g/dL (ref 30.0–36.0)
MCV: 94.5 fL (ref 78.0–100.0)
Platelets: 225 10*3/uL (ref 150–400)
RBC: 3.25 MIL/uL — AB (ref 4.22–5.81)
RDW: 16.6 % — AB (ref 11.5–15.5)
WBC: 17.9 10*3/uL — AB (ref 4.0–10.5)

## 2016-03-12 LAB — PROTIME-INR
INR: 1.68
PROTHROMBIN TIME: 20 s — AB (ref 11.4–15.2)

## 2016-03-12 LAB — HEPARIN LEVEL (UNFRACTIONATED)
HEPARIN UNFRACTIONATED: 0.37 [IU]/mL (ref 0.30–0.70)
HEPARIN UNFRACTIONATED: 0.26 [IU]/mL — AB (ref 0.30–0.70)
Heparin Unfractionated: 0.23 IU/mL — ABNORMAL LOW (ref 0.30–0.70)
Heparin Unfractionated: 0.22 IU/mL — ABNORMAL LOW (ref 0.30–0.70)

## 2016-03-12 LAB — MAGNESIUM: Magnesium: 1.8 mg/dL (ref 1.7–2.4)

## 2016-03-12 LAB — CULTURE, BODY FLUID-BOTTLE: CULTURE: NO GROWTH

## 2016-03-13 ENCOUNTER — Other Ambulatory Visit (HOSPITAL_COMMUNITY): Payer: Medicare Other

## 2016-03-13 LAB — BASIC METABOLIC PANEL
Anion gap: 15 (ref 5–15)
BUN: 96 mg/dL — AB (ref 6–20)
CHLORIDE: 102 mmol/L (ref 101–111)
CO2: 25 mmol/L (ref 22–32)
Calcium: 8.5 mg/dL — ABNORMAL LOW (ref 8.9–10.3)
Creatinine, Ser: 4.61 mg/dL — ABNORMAL HIGH (ref 0.61–1.24)
GFR calc Af Amer: 12 mL/min — ABNORMAL LOW (ref >60)
GFR calc non Af Amer: 10 mL/min — ABNORMAL LOW (ref >60)
GLUCOSE: 164 mg/dL — AB (ref 65–99)
POTASSIUM: 3.7 mmol/L (ref 3.5–5.1)
Sodium: 142 mmol/L (ref 135–145)

## 2016-03-13 LAB — CBC
HEMATOCRIT: 33.4 % — AB (ref 39.0–52.0)
Hemoglobin: 10.1 g/dL — ABNORMAL LOW (ref 13.0–17.0)
MCH: 29 pg (ref 26.0–34.0)
MCHC: 30.2 g/dL (ref 30.0–36.0)
MCV: 96 fL (ref 78.0–100.0)
Platelets: 199 10*3/uL (ref 150–400)
RBC: 3.48 MIL/uL — ABNORMAL LOW (ref 4.22–5.81)
RDW: 16.7 % — AB (ref 11.5–15.5)
WBC: 12.3 10*3/uL — ABNORMAL HIGH (ref 4.0–10.5)

## 2016-03-13 LAB — HEPARIN LEVEL (UNFRACTIONATED)
HEPARIN UNFRACTIONATED: 0.25 [IU]/mL — AB (ref 0.30–0.70)
Heparin Unfractionated: 0.12 IU/mL — ABNORMAL LOW (ref 0.30–0.70)

## 2016-03-13 LAB — PROTIME-INR
INR: 2.89
Prothrombin Time: 30.8 seconds — ABNORMAL HIGH (ref 11.4–15.2)

## 2016-03-13 NOTE — Consult Note (Signed)
[facility] CONSULT NOTE    Date: 03/13/2016                  Patient Name:  [name] [name]  MRN: [id number]  DOB: 11/09/1927  Age / Sex: 82 y.o., male         PCP: Raelene Bott, MD                 Service Requesting Consult: Hospitalist/Dr. Hijazi                 Reason for Consult: Severe acute renal failure/CKD stage IV            History of Present Illness: Patient is a 82 y.o. male with a PMHx of Coronary artery disease status post 3 vessel CABG, hyperlipidemia, hypertension, malnutrition, chronic kidney disease stage IV followed by [facility]phrology, atrial fibrillation  who was admitted to [place] on 03/24/2016 for ongoing treatment of generalized debility, atrial fibrillation, pneumothorax, NSTEMI, anemia of chronic kidney disease was brought here for ongoing management. He was recently admitted to [facility]. He was felt to have a very overall poor prognosis. However family desired continued aggressive care. Patient was unable to provide any history upon my evaluation today. In review of the chart it appears that volume overload, diastolic heart failure, myocardial infarction, and acute renal failure have all been issues recently.  Patient was seen by nephrology at [facility] and it was felt that he was not a candidate for dialysis. His baseline creatinine is in the threes. Currently creatinine is 4.6 with an EGFR of 10.  [facility] as [facility]. He had an episode of acute renal failure that required evaluation in June. At that time it was felt that dialysis should be avoided.   Medications: Outpatient medications: Prescriptions Prior to Admission  Medication Sig Dispense Refill Last Dose  . acetaminophen (TYLENOL) 650 MG CR tablet Take 650 mg by mouth every 4 (four) hours as needed for pain.   Past Week at Unknown time  . Amino Acids-Protein Hydrolys (FEEDING SUPPLEMENT, PRO-STAT SUGAR FREE 64,)  LIQD Take 30 mLs by mouth 2 (two) times daily. 900 mL 0   . aspirin EC 81 MG EC tablet Take 1 tablet (81 mg total) by mouth daily. 30 tablet 0   . escitalopram (LEXAPRO) 10 MG tablet Take 10 mg by mouth daily.   1 02/29/2016 at Unknown time  . feeding supplement, ENSURE ENLIVE, (ENSURE ENLIVE) LIQD Take 237 mLs by mouth 2 (two) times daily between meals. 237 mL 12   . metoprolol tartrate (LOPRESSOR) 25 MG tablet Take 0.5 tablets (12.5 mg total) by mouth 2 (two) times daily. 30 tablet 0   . Multiple Vitamins-Minerals (EYE VITAMINS) CAPS Take 1 capsule by mouth daily.    02/29/2016 at Unknown time  . multivitamin (RENA-VIT) TABS tablet Take 1 tablet by mouth daily.   02/29/2016 at Unknown time  . NITROSTAT 0.4 MG SL tablet DISSOLVE ONE TABLET UNDER THE TONGUE EVERY 5 MINUTES AS NEEDED FOR CHEST PAIN.  DO NOT EXCEED A TOTAL OF 3 DOSES IN 15 MINUTES (Patient taking differently: Place 0.4 mg under the tongue every 5 (five) minutes as needed for chest pain (Do not exceed a total of 3 doses in 15 minutes). ) 25 tablet 0 Unknown at Unknown  . Probiotic Product (ALIGN) 4 MG CAPS Take 4 mg by mouth daily.   02/29/2016 at Unknown time  .  sodium bicarbonate 650 MG tablet Take 1,300 mg by mouth 2 (two) times daily.   02/29/2016 at Unknown time    Current medications: No current facility-administered medications for this encounter.       Allergies: Allergies  Allergen Reactions  . Statins Other (See Comments)    Per MAR at [facility]      Past Medical History: Past Medical History:  Diagnosis Date  . Arthritis   . Bilateral inguinal hernia (BIH), left scrotal 11/28/2011  . C. difficile colitis   . CAD    CABG x 3 in 1997 by Dr. Cyndia Bent; PCI in 2009  . HYPERLIPIDEMIA   . HYPERTENSION   . Malnutrition ([place])   . NSTEMI (non-ST elevated myocardial infarction) ([place])   . Pneumonia 1943  . RENAL INSUFFICIENCY   . Type II diabetes mellitus ([place])      Past Surgical History: Past Surgical History:   Procedure Laterality Date  . APPENDECTOMY    . CATARACT EXTRACTION W/ INTRAOCULAR LENS  IMPLANT, BILATERAL Bilateral   . CORONARY ANGIOPLASTY WITH STENT PLACEMENT  2010  . CORONARY ARTERY BYPASS GRAFT  ~ 1999   "triple"  . IR GENERIC HISTORICAL  03/03/2016   IR CHEST FLUORO 03/03/2016 [facility]-INTERV RAD  . SCROTAL SURGERY Left   . TONSILLECTOMY       Family History: No family history on file.   Social History: Social History   Social History  . Marital status: Married    Spouse name: N/A  . Number of children: N/A  . Years of education: N/A   Occupational History  . Not on file.   Social History Main Topics  . Smoking status: Never Smoker  . Smokeless tobacco: Never Used  . Alcohol use No  . Drug use: No  . Sexual activity: Not Currently   Other Topics Concern  . Not on file   Social History Narrative  . No narrative on file     Review of Systems: Patient unable to provide due to altered mental status.  Vital Signs: Temperature 97.9 pulse 94 respirations 21 blood pressure 1:15/65 Weight trends: There were no vitals filed for this visit.  Physical Exam: General: Critically ill appearing  Head: Normocephalic, atraumatic.  Eyes: Anicteric, EOMI  Nose: Mucous membranes dry not inflammed, nonerythematous.  Throat: Oropharynx nonerythematous, no exudate appreciated. OMs dry  Neck: Supple, trachea midline.  Lungs:  Coarse rhonchi b/l, slightly increased work of breathing  Heart: S1S2 no rubs irregular  Abdomen:  BS normoactive. Soft, Nondistended, non-tender.  No masses or organomegaly.  Extremities: Trace b/l LE edema  Neurologic: obtunded  Skin: B/l UE ecchymoses noted    Lab results: Basic Metabolic Panel:  Recent Labs Lab 03/23/2016 0500  03/09/16 0629  03/11/16 0244 03/12/16 0449 03/13/16 0504  NA 143  < > 142  < > 141 141 142  K 5.3*  < > 5.0  < > 4.0 3.9 3.7  CL 102  < > 100*  < > 97* 99* 102  CO2 35*  < > 32  < > 32 29 25  GLUCOSE 197*  < >  114*  < > 97 199* 164*  BUN 66*  < > 76*  < > 88* 95* 96*  CREATININE 3.94*  < > 3.68*  < > 4.22* 4.40* 4.61*  CALCIUM 8.7*  < > 8.8*  < > 8.9 8.7* 8.5*  MG  --   --  1.7  --   --  1.8  --  PHOS 4.0  --  3.5  --   --   --   --   < > = values in this interval not displayed.  Liver Function Tests:  Recent Labs Lab 03/14/2016 0500  ALBUMIN 1.9*   No results for input(s): LIPASE, AMYLASE in the last 168 hours. No results for input(s): AMMONIA in the last 168 hours.  CBC:  Recent Labs Lab 03/09/16 0629 03/10/16 0755 03/11/16 0244 03/12/16 0449 03/13/16 0504  WBC 13.1* 17.1* 16.7* 17.9* 12.3*  HGB 9.7* 9.9* 9.3* 9.5* 10.1*  HCT 32.4* 31.3* 30.2* 30.7* 33.4*  MCV 97.3 94.8 95.3 94.5 96.0  PLT 225 232 199 225 199    Cardiac Enzymes:  Recent Labs Lab 03/05/2016 2356  TROPONINI 0.12*    BNP: Invalid input(s): POCBNP  CBG:  Recent Labs Lab 03/06/16 2106  GLUCAP 11*    Microbiology: Results for orders placed or performed during the hospital encounter of 03/10/2016  Culture, Urine     Status: Abnormal   Collection Time: 03/08/16 11:20 PM  Result Value Ref Range Status   Specimen Description URINE, CLEAN CATCH  Final   Special Requests NONE  Final   Culture (A)  Final    >=100,000 COLONIES/mL ESCHERICHIA COLI Confirmed Extended Spectrum Beta-Lactamase Producer (ESBL)    Report Status 03/11/2016 FINAL  Final   Organism ID, Bacteria ESCHERICHIA COLI (A)  Final      Susceptibility   Escherichia coli - MIC*    AMPICILLIN >=32 RESISTANT Resistant     CEFAZOLIN >=64 RESISTANT Resistant     CEFTRIAXONE >=64 RESISTANT Resistant     CIPROFLOXACIN >=4 RESISTANT Resistant     GENTAMICIN <=1 SENSITIVE Sensitive     IMIPENEM <=0.25 SENSITIVE Sensitive     NITROFURANTOIN >=512 RESISTANT Resistant     TRIMETH/SULFA <=20 SENSITIVE Sensitive     AMPICILLIN/SULBACTAM >=32 RESISTANT Resistant     PIP/TAZO <=4 SENSITIVE Sensitive     Extended ESBL POSITIVE Resistant     *  >=100,000 COLONIES/mL ESCHERICHIA COLI  C difficile quick scan w PCR reflex     Status: None   Collection Time: 03/09/16  5:17 AM  Result Value Ref Range Status   C Diff antigen NEGATIVE NEGATIVE Final   C Diff toxin NEGATIVE NEGATIVE Final   C Diff interpretation No C. difficile detected.  Final    Coagulation Studies:  Recent Labs  03/11/16 0244 03/12/16 0449 03/13/16 0504  LABPROT 19.0* 20.0* 30.8*  INR 1.58 1.68 2.89    Urinalysis: No results for input(s): COLORURINE, LABSPEC, PHURINE, GLUCOSEU, HGBUR, BILIRUBINUR, KETONESUR, PROTEINUR, UROBILINOGEN, NITRITE, LEUKOCYTESUR in the last 72 hours.  Invalid input(s): APPERANCEUR    Imaging:  No results found.   Assessment & Plan: Pt is a 82 y.o. male with a PMHx of Coronary artery disease status post 3 vessel CABG, hyperlipidemia, hypertension, malnutrition, chronic kidney disease stage IV followed by [facility]ph[facility], atrial fibrillation  who was admitted to [place] on 03/01/2016 for ongoing treatment of generalized debility, atrial fibrillation, pneumothorax, NSTEMI, anemia of chronic kidney disease was brought here for ongoing management. He was recently admitted to [facility]. He was felt to have a very overall poor prognosis. However family desired continued aggressive care.  1.  Acute renal failure. 2.  CKD stage IV, seen by [facility]phrology as outpt, and evaluated by [facility] as recent inpt. 3.  Anemia of CKD.  4.  Diastolic heart failure. 5.  Altered mental status.  Plan:  The  patient appears to be quite a lot this point in time. It appears that he suffered another episode of acute renal failure. Poor by mouth intake may certainly be playing a role. He was taken off of diuretics previously. As an outpatient he has seen [facility] nephrology and her most recent visit was in June of 2017. He was also evaluated by [facility] during his most recent hospitalization at [facility].  During both recent and  independent nephrology evaluations it was felt that the patient would not make a good dialysis candidate given his age and comorbidities. We agree with these prior evaluations. I have discussed the case with the patient's wife. At this point in time we will not proceed with dialysis. The patient's wife will also discussed the overall care with the primary care team. Thanks for consultation.

## 2016-03-14 ENCOUNTER — Other Ambulatory Visit (HOSPITAL_COMMUNITY): Payer: Medicare Other

## 2016-03-14 LAB — CBC WITH DIFFERENTIAL/PLATELET
BASOS PCT: 0 %
Basophils Absolute: 0 10*3/uL (ref 0.0–0.1)
EOS ABS: 0 10*3/uL (ref 0.0–0.7)
EOS PCT: 0 %
HCT: 29.9 % — ABNORMAL LOW (ref 39.0–52.0)
HEMOGLOBIN: 9.4 g/dL — AB (ref 13.0–17.0)
LYMPHS ABS: 1 10*3/uL (ref 0.7–4.0)
Lymphocytes Relative: 11 %
MCH: 29.8 pg (ref 26.0–34.0)
MCHC: 31.4 g/dL (ref 30.0–36.0)
MCV: 94.9 fL (ref 78.0–100.0)
MONO ABS: 0.7 10*3/uL (ref 0.1–1.0)
MONOS PCT: 8 %
Neutro Abs: 7.3 10*3/uL (ref 1.7–7.7)
Neutrophils Relative %: 81 %
Platelets: 206 10*3/uL (ref 150–400)
RBC: 3.15 MIL/uL — ABNORMAL LOW (ref 4.22–5.81)
RDW: 16.7 % — AB (ref 11.5–15.5)
WBC: 9 10*3/uL (ref 4.0–10.5)

## 2016-03-14 LAB — RENAL FUNCTION PANEL
Albumin: 1.8 g/dL — ABNORMAL LOW (ref 3.5–5.0)
Anion gap: 12 (ref 5–15)
BUN: 105 mg/dL — ABNORMAL HIGH (ref 6–20)
CALCIUM: 8.6 mg/dL — AB (ref 8.9–10.3)
CHLORIDE: 100 mmol/L — AB (ref 101–111)
CO2: 31 mmol/L (ref 22–32)
CREATININE: 4.82 mg/dL — AB (ref 0.61–1.24)
GFR calc non Af Amer: 10 mL/min — ABNORMAL LOW (ref >60)
GFR, EST AFRICAN AMERICAN: 11 mL/min — AB (ref >60)
GLUCOSE: 168 mg/dL — AB (ref 65–99)
Phosphorus: 4.5 mg/dL (ref 2.5–4.6)
Potassium: 3.6 mmol/L (ref 3.5–5.1)
SODIUM: 143 mmol/L (ref 135–145)

## 2016-03-14 LAB — PROTIME-INR
INR: 3.7
PROTHROMBIN TIME: 37.6 s — AB (ref 11.4–15.2)

## 2016-03-14 LAB — MAGNESIUM: MAGNESIUM: 2 mg/dL (ref 1.7–2.4)

## 2016-03-15 LAB — RENAL FUNCTION PANEL
ALBUMIN: 1.6 g/dL — AB (ref 3.5–5.0)
ANION GAP: 10 (ref 5–15)
BUN: 122 mg/dL — ABNORMAL HIGH (ref 6–20)
CALCIUM: 8.3 mg/dL — AB (ref 8.9–10.3)
CO2: 29 mmol/L (ref 22–32)
Chloride: 102 mmol/L (ref 101–111)
Creatinine, Ser: 4.9 mg/dL — ABNORMAL HIGH (ref 0.61–1.24)
GFR calc non Af Amer: 10 mL/min — ABNORMAL LOW (ref >60)
GFR, EST AFRICAN AMERICAN: 11 mL/min — AB (ref >60)
GLUCOSE: 241 mg/dL — AB (ref 65–99)
PHOSPHORUS: 3.6 mg/dL (ref 2.5–4.6)
POTASSIUM: 3 mmol/L — AB (ref 3.5–5.1)
Sodium: 141 mmol/L (ref 135–145)

## 2016-03-15 LAB — CBC WITH DIFFERENTIAL/PLATELET
BASOS PCT: 0 %
Basophils Absolute: 0 10*3/uL (ref 0.0–0.1)
Eosinophils Absolute: 0 10*3/uL (ref 0.0–0.7)
Eosinophils Relative: 0 %
HEMATOCRIT: 29.3 % — AB (ref 39.0–52.0)
HEMOGLOBIN: 8.9 g/dL — AB (ref 13.0–17.0)
LYMPHS ABS: 1.1 10*3/uL (ref 0.7–4.0)
Lymphocytes Relative: 13 %
MCH: 28.7 pg (ref 26.0–34.0)
MCHC: 30.4 g/dL (ref 30.0–36.0)
MCV: 94.5 fL (ref 78.0–100.0)
MONOS PCT: 5 %
Monocytes Absolute: 0.4 10*3/uL (ref 0.1–1.0)
NEUTROS ABS: 6.8 10*3/uL (ref 1.7–7.7)
NEUTROS PCT: 82 %
Platelets: 198 10*3/uL (ref 150–400)
RBC: 3.1 MIL/uL — AB (ref 4.22–5.81)
RDW: 16.6 % — ABNORMAL HIGH (ref 11.5–15.5)
WBC: 8.3 10*3/uL (ref 4.0–10.5)

## 2016-03-15 LAB — MAGNESIUM: Magnesium: 1.9 mg/dL (ref 1.7–2.4)

## 2016-03-15 LAB — BRAIN NATRIURETIC PEPTIDE: B NATRIURETIC PEPTIDE 5: 242.8 pg/mL — AB (ref 0.0–100.0)

## 2016-03-15 LAB — PROTIME-INR
INR: 3.89
Prothrombin Time: 39.2 seconds — ABNORMAL HIGH (ref 11.4–15.2)

## 2016-03-15 NOTE — Progress Notes (Signed)
[facility]  ROUNDING NOTE   Subjective:  Patient remains critically ill at this point in time. Patient remains on antibiotic therapy. He is a bit more arousable today. Renal function has worsened.   Objective:  Vital signs in last 24 hours:  Temperature 97.9 pulse 60 respirations 20 blood pressure 103/50   Physical Exam: General: Critically ill appearing   Head: Normocephalic, atraumatic. dry oral mucosal membranes  Eyes: Anicteric  Neck: Supple, trachea midline  Lungs:  Coarse rhonchi bilateral   Heart: S1S2 Irregular   Abdomen:  Soft, nontender, Bowel sounds present   Extremities: Trace peripheral edema.  Neurologic: Arousable today but not following commands   Skin: Bilateral upper extremity ecchymoses        Basic Metabolic Panel:  Recent Labs Lab 03/09/16 0629  03/11/16 0244 03/12/16 0449 03/13/16 0504 03/14/16 0337 03/15/16 0736  NA 142  < > 141 141 142 143 141  K 5.0  < > 4.0 3.9 3.7 3.6 3.0*  CL 100*  < > 97* 99* 102 100* 102  CO2 32  < > 32 29 25 31 29   GLUCOSE 114*  < > 97 199* 164* 168* 241*  BUN 76*  < > 88* 95* 96* 105* 122*  CREATININE 3.68*  < > 4.22* 4.40* 4.61* 4.82* 4.90*  CALCIUM 8.8*  < > 8.9 8.7* 8.5* 8.6* 8.3*  MG 1.7  --   --  1.8  --  2.0 1.9  PHOS 3.5  --   --   --   --  4.5 3.6  < > = values in this interval not displayed.  Liver Function Tests:  Recent Labs Lab 03/14/16 0337 03/15/16 0736  ALBUMIN 1.8* 1.6*   No results for input(s): LIPASE, AMYLASE in the last 168 hours. No results for input(s): AMMONIA in the last 168 hours.  CBC:  Recent Labs Lab 03/11/16 0244 03/12/16 0449 03/13/16 0504 03/14/16 0337 03/15/16 0736  WBC 16.7* 17.9* 12.3* 9.0 8.3  NEUTROABS  --   --   --  7.3 6.8  HGB 9.3* 9.5* 10.1* 9.4* 8.9*  HCT 30.2* 30.7* 33.4* 29.9* 29.3*  MCV 95.3 94.5 96.0 94.9 94.5  PLT 199 225 199 206 198    Cardiac Enzymes: No results for input(s): CKTOTAL, CKMB, CKMBINDEX, TROPONINI in the last 168  hours.  BNP: Invalid input(s): POCBNP  CBG: No results for input(s): GLUCAP in the last 168 hours.  Microbiology: Results for orders placed or performed during the hospital encounter of 03/06/2016  Culture, Urine     Status: Abnormal   Collection Time: 03/08/16 11:20 PM  Result Value Ref Range Status   Specimen Description URINE, CLEAN CATCH  Final   Special Requests NONE  Final   Culture (A)  Final    >=100,000 COLONIES/mL ESCHERICHIA COLI Confirmed Extended Spectrum Beta-Lactamase Producer (ESBL)    Report Status 03/11/2016 FINAL  Final   Organism ID, Bacteria ESCHERICHIA COLI (A)  Final      Susceptibility   Escherichia coli - MIC*    AMPICILLIN >=32 RESISTANT Resistant     CEFAZOLIN >=64 RESISTANT Resistant     CEFTRIAXONE >=64 RESISTANT Resistant     CIPROFLOXACIN >=4 RESISTANT Resistant     GENTAMICIN <=1 SENSITIVE Sensitive     IMIPENEM <=0.25 SENSITIVE Sensitive     NITROFURANTOIN >=512 RESISTANT Resistant     TRIMETH/SULFA <=20 SENSITIVE Sensitive     AMPICILLIN/SULBACTAM >=32 RESISTANT Resistant     PIP/TAZO <=4 SENSITIVE Sensitive  Extended ESBL POSITIVE Resistant     * >=100,000 COLONIES/mL ESCHERICHIA COLI  C difficile quick scan w PCR reflex     Status: None   Collection Time: 03/09/16  5:17 AM  Result Value Ref Range Status   C Diff antigen NEGATIVE NEGATIVE Final   C Diff toxin NEGATIVE NEGATIVE Final   C Diff interpretation No C. difficile detected.  Final    Coagulation Studies:  Recent Labs  03/13/16 0504 03/14/16 0337 03/15/16 0736  LABPROT 30.8* 37.6* 39.2*  INR 2.89 3.70 3.89    Urinalysis: No results for input(s): COLORURINE, LABSPEC, PHURINE, GLUCOSEU, HGBUR, BILIRUBINUR, KETONESUR, PROTEINUR, UROBILINOGEN, NITRITE, LEUKOCYTESUR in the last 72 hours.  Invalid input(s): APPERANCEUR    Imaging: Dg Chest Port 1 View  Result Date: 03/14/2016 CLINICAL DATA:  82 year old male status post PICC line placement. Initial encounter. EXAM:  PORTABLE CHEST 1 VIEW COMPARISON:  0547 hours today. FINDINGS: Portable AP semi upright view at 1954 hours. Stable visible enteric tube. Right PICC line has been placed and appears to terminated at the level of the CABG markers corresponding to the lower SVC level. Moderate to severe bilateral veiling pulmonary opacity worse on the left compatible with pleural effusions. No superimposed pneumothorax. Stable visible mediastinal contour. Ventilation is not significantly changed. IMPRESSION: 1. Right PICC line placed, tip at the lower SVC level. 2. Stable ventilation with moderate to large bilateral pleural effusions worse on the left. Electronically Signed   By: Genevie Ann M.D.   On: 03/14/2016 20:41   Dg Chest Port 1 View  Result Date: 03/14/2016 CLINICAL DATA:  Pneumothorax. EXAM: PORTABLE CHEST 1 VIEW COMPARISON:  03/08/2016 . FINDINGS: Interim placement NG tube, its tip is below left hemidiaphragm. Prior CABG. Heart size stable. Probable persistent left-sided pneumothorax with atelectasis of the left lung is noted. Diffuse pulmonary infiltrate on the right noted most consistent pulmonary edema. Bilateral prominent pleural effusions. IMPRESSION: 1. Interim placement NG tube, its tip is below left hemidiaphragm. 2. Probable persistent left-sided pneumothorax. Difficult to evaluate due to progressive pleural effusion. 3. Prior CABG. Cardiomegaly with pulmonary edema. Progressive prominent bilateral pleural effusions. Electronically Signed   By: Marcello Moores  Register   On: 03/14/2016 07:56   Dg Abd Portable 1v  Result Date: 03/13/2016 CLINICAL DATA:  Nasogastric tube placement EXAM: PORTABLE ABDOMEN - 1 VIEW COMPARISON:  February 09, 2016 FINDINGS: Nasogastric tube tip is in the proximal stomach. The side-port is at the gastroesophageal junction. The bowel gas pattern is unremarkable without bowel obstruction or free air. There is moderate stool in the colon. Lucency in the left base region raises concern for possible  pneumothorax. Advise chest radiograph to further evaluate. IMPRESSION: Nasogastric tube tip is in the proximal stomach. The side port is at the gastroesophageal junction. It would be prudent to consider advancing the nasogastric tube 5-6 cm to confirm placement of the side-port and tube tip well within the stomach. Bowel gas pattern is normal. Questionable pneumothorax on the left. Advise chest radiograph to further evaluate. These results will be called to the ordering clinician or representative by the Radiologist Assistant, and communication documented in the PACS or zVision Dashboard. Electronically Signed   By: Lowella Grip III M.D.   On: 03/13/2016 19:44     Medications:       Assessment/ Plan:  82 y.o. male with a PMHx of Coronary artery disease status post 3 vessel CABG, hyperlipidemia, hypertension, malnutrition, chronic kidney disease stage IV followed by [facility]phrology, atrial fibrillation  who was  admitted to [place] on 03/04/2016 for ongoing treatment of generalized debility, atrial fibrillation, pneumothorax, NSTEMI, anemia of chronic kidney disease was brought here for ongoing management. He was recently admitted to [facility]. He was felt to have a very overall poor prognosis. However family desired continued aggressive care.  1.  Acute renal failure. 2.  CKD stage IV, seen by [facility] Nephrology as outpt, and evaluated by [facility] as recent inpt. 3.  Anemia of CKD.  4.  Diastolic heart failure. 5.  Altered mental status.  Plan:  Patient appears to be a bit more arousable today. However renal function appears to be worsening. BUNs up to 122 with a creatinine of 4.9. However no large change in EGFR. As before the patient has been evaluated by 2 independent nephrology groups. It has been the opinion of both of these foods that the patient does not appear to be a dialysis candidate and we agree with these 2 prior assessments.  Overall he remains critically  ill at this point in time. Family continues to desire aggressive care however. No further input from our perspective.   LOS: 0 [place] 9/20/20174:37 PM

## 2016-03-16 LAB — CBC WITH DIFFERENTIAL/PLATELET
BASOS PCT: 0 %
Basophils Absolute: 0 10*3/uL (ref 0.0–0.1)
EOS PCT: 0 %
Eosinophils Absolute: 0 10*3/uL (ref 0.0–0.7)
HEMATOCRIT: 29 % — AB (ref 39.0–52.0)
HEMOGLOBIN: 8.9 g/dL — AB (ref 13.0–17.0)
LYMPHS ABS: 1.9 10*3/uL (ref 0.7–4.0)
Lymphocytes Relative: 20 %
MCH: 29.2 pg (ref 26.0–34.0)
MCHC: 30.7 g/dL (ref 30.0–36.0)
MCV: 95.1 fL (ref 78.0–100.0)
MONO ABS: 0.7 10*3/uL (ref 0.1–1.0)
Monocytes Relative: 7 %
NEUTROS ABS: 6.8 10*3/uL (ref 1.7–7.7)
NEUTROS PCT: 73 %
Platelets: 214 10*3/uL (ref 150–400)
RBC: 3.05 MIL/uL — AB (ref 4.22–5.81)
RDW: 16.7 % — AB (ref 11.5–15.5)
WBC: 9.4 10*3/uL (ref 4.0–10.5)

## 2016-03-16 LAB — RENAL FUNCTION PANEL
ANION GAP: 10 (ref 5–15)
Albumin: 1.6 g/dL — ABNORMAL LOW (ref 3.5–5.0)
BUN: 128 mg/dL — AB (ref 6–20)
CHLORIDE: 102 mmol/L (ref 101–111)
CO2: 29 mmol/L (ref 22–32)
Calcium: 8.5 mg/dL — ABNORMAL LOW (ref 8.9–10.3)
Creatinine, Ser: 4.91 mg/dL — ABNORMAL HIGH (ref 0.61–1.24)
GFR calc Af Amer: 11 mL/min — ABNORMAL LOW (ref >60)
GFR calc non Af Amer: 10 mL/min — ABNORMAL LOW (ref >60)
GLUCOSE: 195 mg/dL — AB (ref 65–99)
POTASSIUM: 4 mmol/L (ref 3.5–5.1)
Phosphorus: 3.3 mg/dL (ref 2.5–4.6)
Sodium: 141 mmol/L (ref 135–145)

## 2016-03-16 LAB — PROTIME-INR
INR: 3.31
Prothrombin Time: 34.4 seconds — ABNORMAL HIGH (ref 11.4–15.2)

## 2016-03-16 LAB — MAGNESIUM: Magnesium: 1.9 mg/dL (ref 1.7–2.4)

## 2016-03-17 ENCOUNTER — Other Ambulatory Visit (HOSPITAL_COMMUNITY): Payer: Medicare Other

## 2016-03-17 LAB — CBC
HCT: 27.6 % — ABNORMAL LOW (ref 39.0–52.0)
HEMOGLOBIN: 8.3 g/dL — AB (ref 13.0–17.0)
MCH: 28.5 pg (ref 26.0–34.0)
MCHC: 30.1 g/dL (ref 30.0–36.0)
MCV: 94.8 fL (ref 78.0–100.0)
Platelets: 235 10*3/uL (ref 150–400)
RBC: 2.91 MIL/uL — AB (ref 4.22–5.81)
RDW: 16.4 % — ABNORMAL HIGH (ref 11.5–15.5)
WBC: 10.4 10*3/uL (ref 4.0–10.5)

## 2016-03-17 LAB — PROTIME-INR
INR: 3.26
PROTHROMBIN TIME: 34 s — AB (ref 11.4–15.2)

## 2016-03-18 ENCOUNTER — Other Ambulatory Visit (HOSPITAL_COMMUNITY): Payer: Medicare Other

## 2016-03-18 LAB — PROTIME-INR
INR: 2.77
PROTHROMBIN TIME: 29.8 s — AB (ref 11.4–15.2)

## 2016-03-18 LAB — BLOOD GAS, ARTERIAL
Acid-base deficit: 0.9 mmol/L (ref 0.0–2.0)
Bicarbonate: 22.6 mmol/L (ref 20.0–28.0)
DRAWN BY: 290171
FIO2: 50
O2 Saturation: 97.8 %
PEEP: 5 cmH2O
Patient temperature: 98.6
RATE: 24 resp/min
VT: 500 mL
pCO2 arterial: 33.2 mmHg (ref 32.0–48.0)
pH, Arterial: 7.447 (ref 7.350–7.450)
pO2, Arterial: 96.9 mmHg (ref 83.0–108.0)

## 2016-03-18 LAB — CBC WITH DIFFERENTIAL/PLATELET
BASOS ABS: 0 10*3/uL (ref 0.0–0.1)
Basophils Relative: 0 %
EOS ABS: 0 10*3/uL (ref 0.0–0.7)
Eosinophils Relative: 0 %
HCT: 29 % — ABNORMAL LOW (ref 39.0–52.0)
HEMOGLOBIN: 9 g/dL — AB (ref 13.0–17.0)
LYMPHS ABS: 0.9 10*3/uL (ref 0.7–4.0)
Lymphocytes Relative: 7 %
MCH: 29 pg (ref 26.0–34.0)
MCHC: 31 g/dL (ref 30.0–36.0)
MCV: 93.5 fL (ref 78.0–100.0)
MONOS PCT: 4 %
Monocytes Absolute: 0.5 10*3/uL (ref 0.1–1.0)
Neutro Abs: 11.4 10*3/uL — ABNORMAL HIGH (ref 1.7–7.7)
Neutrophils Relative %: 89 %
PLATELETS: 259 10*3/uL (ref 150–400)
RBC: 3.1 MIL/uL — AB (ref 4.22–5.81)
RDW: 16.3 % — AB (ref 11.5–15.5)
WBC: 12.8 10*3/uL — AB (ref 4.0–10.5)

## 2016-03-18 LAB — BASIC METABOLIC PANEL
Anion gap: 13 (ref 5–15)
BUN: 147 mg/dL — AB (ref 6–20)
CALCIUM: 8.7 mg/dL — AB (ref 8.9–10.3)
CO2: 25 mmol/L (ref 22–32)
CREATININE: 5.27 mg/dL — AB (ref 0.61–1.24)
Chloride: 100 mmol/L — ABNORMAL LOW (ref 101–111)
GFR calc Af Amer: 10 mL/min — ABNORMAL LOW (ref >60)
GFR, EST NON AFRICAN AMERICAN: 9 mL/min — AB (ref >60)
Glucose, Bld: 342 mg/dL — ABNORMAL HIGH (ref 65–99)
POTASSIUM: 4.4 mmol/L (ref 3.5–5.1)
SODIUM: 138 mmol/L (ref 135–145)

## 2016-03-18 LAB — CBC
HCT: 31.5 % — ABNORMAL LOW (ref 39.0–52.0)
Hemoglobin: 9.7 g/dL — ABNORMAL LOW (ref 13.0–17.0)
MCH: 28.8 pg (ref 26.0–34.0)
MCHC: 30.8 g/dL (ref 30.0–36.0)
MCV: 93.5 fL (ref 78.0–100.0)
PLATELETS: 349 10*3/uL (ref 150–400)
RBC: 3.37 MIL/uL — AB (ref 4.22–5.81)
RDW: 16.1 % — AB (ref 11.5–15.5)
WBC: 20.4 10*3/uL — ABNORMAL HIGH (ref 4.0–10.5)

## 2016-03-18 LAB — RENAL FUNCTION PANEL
ALBUMIN: 1.6 g/dL — AB (ref 3.5–5.0)
ANION GAP: 14 (ref 5–15)
BUN: 142 mg/dL — AB (ref 6–20)
CALCIUM: 8.7 mg/dL — AB (ref 8.9–10.3)
CO2: 28 mmol/L (ref 22–32)
CREATININE: 5.08 mg/dL — AB (ref 0.61–1.24)
Chloride: 97 mmol/L — ABNORMAL LOW (ref 101–111)
GFR calc Af Amer: 11 mL/min — ABNORMAL LOW (ref >60)
GFR calc non Af Amer: 9 mL/min — ABNORMAL LOW (ref >60)
GLUCOSE: 336 mg/dL — AB (ref 65–99)
PHOSPHORUS: 5 mg/dL — AB (ref 2.5–4.6)
Potassium: 4 mmol/L (ref 3.5–5.1)
SODIUM: 139 mmol/L (ref 135–145)

## 2016-03-18 LAB — MAGNESIUM: Magnesium: 2.2 mg/dL (ref 1.7–2.4)

## 2016-03-19 ENCOUNTER — Other Ambulatory Visit (HOSPITAL_COMMUNITY): Payer: Medicare Other

## 2016-03-19 LAB — COMPREHENSIVE METABOLIC PANEL
ALBUMIN: 1.6 g/dL — AB (ref 3.5–5.0)
ALT: 64 U/L — ABNORMAL HIGH (ref 17–63)
AST: 34 U/L (ref 15–41)
Alkaline Phosphatase: 309 U/L — ABNORMAL HIGH (ref 38–126)
Anion gap: 13 (ref 5–15)
BILIRUBIN TOTAL: 0.8 mg/dL (ref 0.3–1.2)
BUN: 148 mg/dL — AB (ref 6–20)
CO2: 26 mmol/L (ref 22–32)
Calcium: 8.4 mg/dL — ABNORMAL LOW (ref 8.9–10.3)
Chloride: 99 mmol/L — ABNORMAL LOW (ref 101–111)
Creatinine, Ser: 5.55 mg/dL — ABNORMAL HIGH (ref 0.61–1.24)
GFR calc Af Amer: 10 mL/min — ABNORMAL LOW (ref >60)
GFR calc non Af Amer: 8 mL/min — ABNORMAL LOW (ref >60)
GLUCOSE: 184 mg/dL — AB (ref 65–99)
Potassium: 4.4 mmol/L (ref 3.5–5.1)
SODIUM: 138 mmol/L (ref 135–145)
TOTAL PROTEIN: 5.5 g/dL — AB (ref 6.5–8.1)

## 2016-03-19 LAB — CBC WITH DIFFERENTIAL/PLATELET
BASOS ABS: 0 10*3/uL (ref 0.0–0.1)
Basophils Relative: 0 %
EOS PCT: 0 %
Eosinophils Absolute: 0 10*3/uL (ref 0.0–0.7)
HCT: 27.9 % — ABNORMAL LOW (ref 39.0–52.0)
Hemoglobin: 8.9 g/dL — ABNORMAL LOW (ref 13.0–17.0)
LYMPHS ABS: 1.6 10*3/uL (ref 0.7–4.0)
Lymphocytes Relative: 7 %
MCH: 29.4 pg (ref 26.0–34.0)
MCHC: 31.9 g/dL (ref 30.0–36.0)
MCV: 92.1 fL (ref 78.0–100.0)
MONO ABS: 0.9 10*3/uL (ref 0.1–1.0)
Monocytes Relative: 4 %
Neutro Abs: 20.7 10*3/uL — ABNORMAL HIGH (ref 1.7–7.7)
Neutrophils Relative %: 89 %
PLATELETS: 366 10*3/uL (ref 150–400)
RBC: 3.03 MIL/uL — AB (ref 4.22–5.81)
RDW: 16.2 % — AB (ref 11.5–15.5)
WBC Morphology: INCREASED
WBC: 23.2 10*3/uL — AB (ref 4.0–10.5)

## 2016-03-19 LAB — MAGNESIUM: Magnesium: 2.1 mg/dL (ref 1.7–2.4)

## 2016-03-19 LAB — PHOSPHORUS: Phosphorus: 4.8 mg/dL — ABNORMAL HIGH (ref 2.5–4.6)

## 2016-03-19 LAB — PROTIME-INR
INR: 3.15
Prothrombin Time: 33.1 seconds — ABNORMAL HIGH (ref 11.4–15.2)

## 2016-03-19 LAB — C DIFFICILE QUICK SCREEN W PCR REFLEX
C DIFFICILE (CDIFF) INTERP: NOT DETECTED
C DIFFICILE (CDIFF) TOXIN: NEGATIVE
C Diff antigen: NEGATIVE

## 2016-03-20 ENCOUNTER — Other Ambulatory Visit (HOSPITAL_COMMUNITY): Payer: Medicare Other

## 2016-03-20 LAB — CBC WITH DIFFERENTIAL/PLATELET
BASOS ABS: 0 10*3/uL (ref 0.0–0.1)
BASOS PCT: 0 %
EOS ABS: 0 10*3/uL (ref 0.0–0.7)
Eosinophils Relative: 0 %
HCT: 25 % — ABNORMAL LOW (ref 39.0–52.0)
Hemoglobin: 8.1 g/dL — ABNORMAL LOW (ref 13.0–17.0)
LYMPHS ABS: 1.8 10*3/uL (ref 0.7–4.0)
Lymphocytes Relative: 10 %
MCH: 29.5 pg (ref 26.0–34.0)
MCHC: 32.4 g/dL (ref 30.0–36.0)
MCV: 90.9 fL (ref 78.0–100.0)
Monocytes Absolute: 0.9 10*3/uL (ref 0.1–1.0)
Monocytes Relative: 5 %
NEUTROS ABS: 15.4 10*3/uL — AB (ref 1.7–7.7)
Neutrophils Relative %: 85 %
Platelets: 284 10*3/uL (ref 150–400)
RBC: 2.75 MIL/uL — ABNORMAL LOW (ref 4.22–5.81)
RDW: 16.4 % — AB (ref 11.5–15.5)
WBC: 18.1 10*3/uL — ABNORMAL HIGH (ref 4.0–10.5)

## 2016-03-20 LAB — PROTIME-INR
INR: 3.56
PROTHROMBIN TIME: 36.5 s — AB (ref 11.4–15.2)

## 2016-03-20 LAB — RENAL FUNCTION PANEL
ANION GAP: 12 (ref 5–15)
Albumin: 1.5 g/dL — ABNORMAL LOW (ref 3.5–5.0)
BUN: 147 mg/dL — ABNORMAL HIGH (ref 6–20)
CALCIUM: 8.1 mg/dL — AB (ref 8.9–10.3)
CHLORIDE: 98 mmol/L — AB (ref 101–111)
CO2: 25 mmol/L (ref 22–32)
CREATININE: 5.57 mg/dL — AB (ref 0.61–1.24)
GFR calc Af Amer: 10 mL/min — ABNORMAL LOW (ref >60)
GFR calc non Af Amer: 8 mL/min — ABNORMAL LOW (ref >60)
GLUCOSE: 166 mg/dL — AB (ref 65–99)
Phosphorus: 4.7 mg/dL — ABNORMAL HIGH (ref 2.5–4.6)
Potassium: 4.1 mmol/L (ref 3.5–5.1)
SODIUM: 135 mmol/L (ref 135–145)

## 2016-03-20 LAB — MAGNESIUM: MAGNESIUM: 1.9 mg/dL (ref 1.7–2.4)

## 2016-03-20 MED FILL — Medication: Qty: 1 | Status: AC

## 2016-03-20 NOTE — Progress Notes (Signed)
[facility]  ROUNDING NOTE   Subjective:  Patient remains critically ill at this point in time. Patient remains on antibiotic therapy. He is a bit more arousable today. Renal function remains poor BUN/CR remain critically high   Objective:  Vital signs in last 24 hours:  Temperature 97.7 pulse 127 respirations 24 blood pressure 111/69   Physical Exam: General: Critically ill appearing   Head: Normocephalic, atraumatic. dry oral mucosal membranes  Eyes: Anicteric  Neck: Supple, trachea midline  Lungs:  Coarse rhonchi bilateral , vent assisted  Heart: S1S2 Irregular   Abdomen:  Soft, nontender, Bowel sounds present   Extremities: + dependent peripheral edema.  Neurologic: Arousable today; follows simple commands   Skin: Bilateral upper extremity ecchymoses        Basic Metabolic Panel:  Recent Labs Lab 03/15/16 0736 03/16/16 0610 03/18/16 0545 03/18/16 1830 03/19/16 0630 03/20/16 0500  NA 141 141 139 138 138 135  K 3.0* 4.0 4.0 4.4 4.4 4.1  CL 102 102 97* 100* 99* 98*  CO2 29 29 28 25 26 25   GLUCOSE 241* 195* 336* 342* 184* 166*  BUN 122* 128* 142* 147* 148* 147*  CREATININE 4.90* 4.91* 5.08* 5.27* 5.55* 5.57*  CALCIUM 8.3* 8.5* 8.7* 8.7* 8.4* 8.1*  MG 1.9 1.9 2.2  --  2.1 1.9  PHOS 3.6 3.3 5.0*  --  4.8* 4.7*    Liver Function Tests:  Recent Labs Lab 03/15/16 0736 03/16/16 0610 03/18/16 0545 03/19/16 0630 03/20/16 0500  AST  --   --   --  34  --   ALT  --   --   --  64*  --   ALKPHOS  --   --   --  309*  --   BILITOT  --   --   --  0.8  --   PROT  --   --   --  5.5*  --   ALBUMIN 1.6* 1.6* 1.6* 1.6* 1.5*   No results for input(s): LIPASE, AMYLASE in the last 168 hours. No results for input(s): AMMONIA in the last 168 hours.  CBC:  Recent Labs Lab 03/15/16 0736 03/16/16 0610 03/17/16 0620 03/18/16 0545 03/18/16 1830 03/19/16 0630 03/20/16 0500  WBC 8.3 9.4 10.4 12.8* 20.4* 23.2* 18.1*  NEUTROABS 6.8 6.8  --  11.4*  --   20.7* 15.4*  HGB 8.9* 8.9* 8.3* 9.0* 9.7* 8.9* 8.1*  HCT 29.3* 29.0* 27.6* 29.0* 31.5* 27.9* 25.0*  MCV 94.5 95.1 94.8 93.5 93.5 92.1 90.9  PLT 198 214 235 259 349 366 284    Cardiac Enzymes: No results for input(s): CKTOTAL, CKMB, CKMBINDEX, TROPONINI in the last 168 hours.  BNP: Invalid input(s): POCBNP  CBG: No results for input(s): GLUCAP in the last 168 hours.  Microbiology: Results for orders placed or performed during the hospital encounter of 03/06/2016  Culture, Urine     Status: Abnormal   Collection Time: 03/08/16 11:20 PM  Result Value Ref Range Status   Specimen Description URINE, CLEAN CATCH  Final   Special Requests NONE  Final   Culture (A)  Final    >=100,000 COLONIES/mL ESCHERICHIA COLI Confirmed Extended Spectrum Beta-Lactamase Producer (ESBL)    Report Status 03/11/2016 FINAL  Final   Organism ID, Bacteria ESCHERICHIA COLI (A)  Final      Susceptibility   Escherichia coli - MIC*    AMPICILLIN >=32 RESISTANT Resistant     CEFAZOLIN >=64 RESISTANT Resistant     CEFTRIAXONE >=64 RESISTANT  Resistant     CIPROFLOXACIN >=4 RESISTANT Resistant     GENTAMICIN <=1 SENSITIVE Sensitive     IMIPENEM <=0.25 SENSITIVE Sensitive     NITROFURANTOIN >=512 RESISTANT Resistant     TRIMETH/SULFA <=20 SENSITIVE Sensitive     AMPICILLIN/SULBACTAM >=32 RESISTANT Resistant     PIP/TAZO <=4 SENSITIVE Sensitive     Extended ESBL POSITIVE Resistant     * >=100,000 COLONIES/mL ESCHERICHIA COLI  C difficile quick scan w PCR reflex     Status: None   Collection Time: 03/09/16  5:17 AM  Result Value Ref Range Status   C Diff antigen NEGATIVE NEGATIVE Final   C Diff toxin NEGATIVE NEGATIVE Final   C Diff interpretation No C. difficile detected.  Final  C difficile quick scan w PCR reflex     Status: None   Collection Time: 03/19/16  9:24 AM  Result Value Ref Range Status   C Diff antigen NEGATIVE NEGATIVE Final   C Diff toxin NEGATIVE NEGATIVE Final   C Diff interpretation No  C. difficile detected.  Final  Culture, respiratory (NON-Expectorated)     Status: None (Preliminary result)   Collection Time: 03/19/16  2:42 PM  Result Value Ref Range Status   Specimen Description TRACHEAL ASPIRATE  Final   Special Requests NONE  Final   Gram Stain   Final    MODERATE WBC PRESENT, PREDOMINANTLY PMN FEW BUDDING YEAST SEEN    Culture MODERATE CANDIDA ALBICANS  Final   Report Status PENDING  Incomplete    Coagulation Studies:  Recent Labs  03/18/16 0545 03/19/16 0630 03/20/16 0500  LABPROT 29.8* 33.1* 36.5*  INR 2.77 3.15 3.56    Urinalysis: No results for input(s): COLORURINE, LABSPEC, PHURINE, GLUCOSEU, HGBUR, BILIRUBINUR, KETONESUR, PROTEINUR, UROBILINOGEN, NITRITE, LEUKOCYTESUR in the last 72 hours.  Invalid input(s): APPERANCEUR    Imaging: Dg Chest Port 1 View  Result Date: 03/20/2016 CLINICAL DATA:  Respiratory failure. History of pneumothorax and chest tube placement. EXAM: PORTABLE CHEST 1 VIEW COMPARISON:  03/19/2016; 03/18/2016; 03/03/2016 FINDINGS: Grossly unchanged cardiac silhouette and mediastinal contours with postsurgical change of the left hilum. Post median sternotomy and CABG. Stable position of support apparatus. No pneumothorax. Grossly unchanged moderate to large-sized likely partially loculated left-sided effusion and small right-sided pleural effusion. Pulmonary vasculature is indistinct with cephalization of flow. Potential minimally improved aeration the right mid and lower lung otherwise, unchanged right basilar heterogeneous opacities in consolidative left lower lung opacities. No new focal airspace opacities. Unchanged bones. IMPRESSION: 1. Stable positioning of support apparatus.  No pneumothorax. 2. Similar findings of pulmonary edema and grossly unchanged small right and moderate to large size likely partially loculated sided effusions. 3. Unchanged consolidative left lower lung opacities with minimally improved aeration the right  lung base, potentially atelectasis though underlying infection is not excluded. Electronically Signed   By: Sandi Mariscal M.D.   On: 03/20/2016 07:49   Dg Chest Port 1 View  Result Date: 03/19/2016 CLINICAL DATA:  82 year old male with a history of respiratory failure EXAM: PORTABLE CHEST 1 VIEW COMPARISON:  Multiple prior plain film, most recent 03/18/2016. FINDINGS: Cardiomediastinal silhouette unchanged, obscured by overlying lung/ pleural disease. Calcifications of the aortic arch. Unchanged position of endotracheal tube, terminating 4.2 cm above the carina. Unchanged right upper extremity PICC, terminating at superior vena cava. Surgical changes of prior median sternotomy and CABG. Worst aeration at the right base, with increasing mixed interstitial and airspace opacity partially obscuring the right hemidiaphragm in the right heart border.  No pneumothorax. Persistent dense opacity on the left, obscuring left heart border, left hemidiaphragm, the pleural parenchymal interface extending to the apex. IMPRESSION: Worsening right-sided aeration, potentially combination of pleural effusion, atelectasis/ consolidation, and edema. Persisting left chest opacity, with only minimal aerated left upper lung. Unchanged endotracheal tube and right upper extremity PICC. Surgical changes of prior median sternotomy and CABG. Aortic atherosclerosis. Signed, Dulcy Fanny. Earleen Newport, DO [facility] Electronically Signed   By: Corrie Mckusick D.O.   On: 03/19/2016 16:10   Dg Chest Port 1 View  Result Date: 03/18/2016 CLINICAL DATA:  Intubated EXAM: PORTABLE CHEST 1 VIEW COMPARISON:  Chest radiograph from earlier today. FINDINGS: Endotracheal tube tip is 3.0 cm above the carina. Right PICC terminates in the lower third of the superior vena cava. Sternotomy wires appear aligned and intact. CABG clips overlie the mediastinum. Stable cardiomediastinal silhouette with normal heart  size. No pneumothorax. Large left pleural effusion appears stable. Small right pleural effusion appears stable. Near complete left lung atelectasis, unchanged. Mild right basilar atelectasis. Stable mild pulmonary edema. IMPRESSION: 1. Well-positioned endotracheal tube and right PICC. 2. Stable large left and small right pleural effusions. 3. Stable mild pulmonary edema. 4. Stable near complete left lung and mild basilar right lung atelectasis. Electronically Signed   By: Ilona Sorrel M.D.   On: 03/18/2016 19:53   Dg Abd Portable 1v  Result Date: 03/20/2016 CLINICAL DATA:  Orogastric tube placement.  Initial encounter. EXAM: PORTABLE ABDOMEN - 1 VIEW COMPARISON:  Abdominal radiograph performed 03/18/2016 FINDINGS: The patient's enteric tube is noted ending overlying the body of the stomach. The visualized bowel gas pattern is grossly unremarkable. No free intra-abdominal air is seen, though evaluation for free air is limited on a single supine view. No acute osseous abnormalities are identified. IMPRESSION: Enteric tube noted ending overlying the body of the stomach. Electronically Signed   By: Garald Balding M.D.   On: 03/20/2016 02:07     Medications:       Assessment/ Plan:  82 y.o. male with a PMHx of Coronary artery disease status post 3 vessel CABG, hyperlipidemia, hypertension, malnutrition, chronic kidney disease stage IV followed by [facility] Ne[facility]rology, atrial fibrillation  who was admitted to [place] on 02/27/2016 for ongoing treatment of generalized debility, atrial fibrillation, pneumothorax, NSTEMI, anemia of chronic kidney disease was brought here for ongoing management. He was recently admitted to [facility]. He was felt to have a very overall poor prognosis. However family desired continued aggressive care.  1.  Acute renal failure. 2.  CKD stage IV, seen by [facility] Nephrology as outpt, and evaluated by [facility] as recent inpt. 3.  Pulmonary Hypertension, peak PA  pressure 57 mmHG, Rheumatic mitral valve disease, Mod to severe Tricuspid regurgitation 4.  Atrial Fibrilliation with RVR 5.  Acute resp failure, large left pleural effusion, ventilator assisted 6.  Dependent Edema  Plan:  Patient appears to be a bit more arousable today. However renal function remains poor and Critically low. BUNs up to 147 with a creatinine of 5.5. However no large change in EGFR. As before the patient has been evaluated by 2 independent nephrology groups. It has been the opinion of both of these groups that the patient does not appear to be a dialysis candidate and we agree with these 2 prior assessments. He has severe underlying valvular heart disease, Pulm HTN and A Fib. Patient is not expected to tolerate the volume shifts brought by dialysis.  Overall  he remains critically ill at this point in time.  Met with Patient's wife, his Son Delando, and [place]. Discussed that dialysis will probably make his condition worse. Also discussed that PEG tube is another aggressive measure which is not expected to improve outcome but will be painful for the patient. So far, we have decided to leave at the current level of care and not escalate care or put him through procedures.  Patient's family will discuss this amongst themselves. - Will add small dose of iv albumin to see if it helps reduce 3rd spacing. - Monitors lytes daily   LOS: 0 Ermine Stebbins 9/25/20174:10 PM

## 2016-03-21 LAB — CULTURE, RESPIRATORY

## 2016-03-21 LAB — CBC WITH DIFFERENTIAL/PLATELET
BASOS ABS: 0 10*3/uL (ref 0.0–0.1)
BASOS PCT: 0 %
EOS ABS: 0 10*3/uL (ref 0.0–0.7)
Eosinophils Relative: 0 %
HCT: 23 % — ABNORMAL LOW (ref 39.0–52.0)
HEMOGLOBIN: 7.3 g/dL — AB (ref 13.0–17.0)
LYMPHS ABS: 1.7 10*3/uL (ref 0.7–4.0)
Lymphocytes Relative: 12 %
MCH: 28.6 pg (ref 26.0–34.0)
MCHC: 31.7 g/dL (ref 30.0–36.0)
MCV: 90.2 fL (ref 78.0–100.0)
Monocytes Absolute: 0.7 10*3/uL (ref 0.1–1.0)
Monocytes Relative: 5 %
NEUTROS ABS: 12.1 10*3/uL — AB (ref 1.7–7.7)
Neutrophils Relative %: 83 %
PLATELETS: 272 10*3/uL (ref 150–400)
RBC: 2.55 MIL/uL — ABNORMAL LOW (ref 4.22–5.81)
RDW: 16 % — ABNORMAL HIGH (ref 11.5–15.5)
WBC: 14.5 10*3/uL — ABNORMAL HIGH (ref 4.0–10.5)

## 2016-03-21 LAB — RENAL FUNCTION PANEL
ANION GAP: 13 (ref 5–15)
Albumin: 1.7 g/dL — ABNORMAL LOW (ref 3.5–5.0)
BUN: 155 mg/dL — ABNORMAL HIGH (ref 6–20)
CALCIUM: 8 mg/dL — AB (ref 8.9–10.3)
CHLORIDE: 96 mmol/L — AB (ref 101–111)
CO2: 24 mmol/L (ref 22–32)
Creatinine, Ser: 5.85 mg/dL — ABNORMAL HIGH (ref 0.61–1.24)
GFR calc non Af Amer: 8 mL/min — ABNORMAL LOW (ref >60)
GFR, EST AFRICAN AMERICAN: 9 mL/min — AB (ref >60)
GLUCOSE: 156 mg/dL — AB (ref 65–99)
Phosphorus: 5.1 mg/dL — ABNORMAL HIGH (ref 2.5–4.6)
Potassium: 3.7 mmol/L (ref 3.5–5.1)
SODIUM: 133 mmol/L — AB (ref 135–145)

## 2016-03-21 LAB — BASIC METABOLIC PANEL
Anion gap: 13 (ref 5–15)
BUN: 155 mg/dL — ABNORMAL HIGH (ref 6–20)
CHLORIDE: 95 mmol/L — AB (ref 101–111)
CO2: 24 mmol/L (ref 22–32)
Calcium: 8.1 mg/dL — ABNORMAL LOW (ref 8.9–10.3)
Creatinine, Ser: 5.89 mg/dL — ABNORMAL HIGH (ref 0.61–1.24)
GFR calc non Af Amer: 8 mL/min — ABNORMAL LOW (ref >60)
GFR, EST AFRICAN AMERICAN: 9 mL/min — AB (ref >60)
Glucose, Bld: 155 mg/dL — ABNORMAL HIGH (ref 65–99)
POTASSIUM: 3.8 mmol/L (ref 3.5–5.1)
SODIUM: 132 mmol/L — AB (ref 135–145)

## 2016-03-21 LAB — PROTIME-INR
INR: 4.17 — AB
PROTHROMBIN TIME: 41.3 s — AB (ref 11.4–15.2)

## 2016-03-21 LAB — MAGNESIUM: Magnesium: 1.9 mg/dL (ref 1.7–2.4)

## 2016-03-21 LAB — CULTURE, RESPIRATORY W GRAM STAIN

## 2016-03-22 ENCOUNTER — Other Ambulatory Visit (HOSPITAL_COMMUNITY): Payer: Medicare Other

## 2016-03-22 LAB — CBC WITH DIFFERENTIAL/PLATELET
Basophils Absolute: 0 K/uL (ref 0.0–0.1)
Basophils Relative: 0 %
Eosinophils Absolute: 0.1 K/uL (ref 0.0–0.7)
Eosinophils Relative: 1 %
HCT: 21.4 % — ABNORMAL LOW (ref 39.0–52.0)
Hemoglobin: 7 g/dL — ABNORMAL LOW (ref 13.0–17.0)
Lymphocytes Relative: 11 %
Lymphs Abs: 1.8 K/uL (ref 0.7–4.0)
MCH: 29.3 pg (ref 26.0–34.0)
MCHC: 32.7 g/dL (ref 30.0–36.0)
MCV: 89.5 fL (ref 78.0–100.0)
Monocytes Absolute: 1 K/uL (ref 0.1–1.0)
Monocytes Relative: 6 %
Neutro Abs: 12.8 K/uL — ABNORMAL HIGH (ref 1.7–7.7)
Neutrophils Relative %: 82 %
Platelets: ADEQUATE K/uL (ref 150–400)
RBC: 2.39 MIL/uL — ABNORMAL LOW (ref 4.22–5.81)
RDW: 16.1 % — ABNORMAL HIGH (ref 11.5–15.5)
WBC: 15.7 K/uL — ABNORMAL HIGH (ref 4.0–10.5)

## 2016-03-22 LAB — PHOSPHORUS: PHOSPHORUS: 6.1 mg/dL — AB (ref 2.5–4.6)

## 2016-03-22 LAB — TYPE AND SCREEN
ABO/RH(D): O POS
ANTIBODY SCREEN: NEGATIVE

## 2016-03-22 LAB — ABO/RH: ABO/RH(D): O POS

## 2016-03-22 LAB — BASIC METABOLIC PANEL WITH GFR
Anion gap: 16 — ABNORMAL HIGH (ref 5–15)
BUN: 157 mg/dL — ABNORMAL HIGH (ref 6–20)
CO2: 19 mmol/L — ABNORMAL LOW (ref 22–32)
Calcium: 8 mg/dL — ABNORMAL LOW (ref 8.9–10.3)
Chloride: 96 mmol/L — ABNORMAL LOW (ref 101–111)
Creatinine, Ser: 6.08 mg/dL — ABNORMAL HIGH (ref 0.61–1.24)
GFR calc Af Amer: 9 mL/min — ABNORMAL LOW (ref >60)
GFR calc non Af Amer: 7 mL/min — ABNORMAL LOW (ref >60)
Glucose, Bld: 81 mg/dL (ref 65–99)
Potassium: 3.9 mmol/L (ref 3.5–5.1)
Sodium: 131 mmol/L — ABNORMAL LOW (ref 135–145)

## 2016-03-22 LAB — PROTIME-INR
INR: 1.74
Prothrombin Time: 20.5 s — ABNORMAL HIGH (ref 11.4–15.2)

## 2016-03-22 LAB — MAGNESIUM: MAGNESIUM: 2 mg/dL (ref 1.7–2.4)

## 2016-03-22 LAB — ALBUMIN: Albumin: 1.9 g/dL — ABNORMAL LOW (ref 3.5–5.0)

## 2016-03-22 NOTE — Progress Notes (Signed)
[facility]  ROUNDING NOTE   Subjective:  Patient remains critically ill at this point in time. Requiring levophed He is a bit more arousable today. Renal function remains poor BUN/CR remain critically high UOP 750 cc  Given iv albumin  Vent assisted. Fio2 28%   Objective:  Vital signs in last 24 hours:  Temperature 96.7 pulse 86 respirations 16 blood pressure 102/65   Physical Exam: General: Critically ill appearing   Head: Normocephalic, atraumatic. dry oral mucosal membranes, OGT, ETT  Eyes: Anicteric  Neck: Supple, trachea midline  Lungs:  Coarse rhonchi bilateral , vent assisted  Heart: S1S2 Irregular   Abdomen:  Soft, nontender, Bowel sounds present   Extremities: + dependent peripheral edema.  Neurologic: Arousable today; follows simple commands   Skin: Bilateral upper extremity ecchymoses        Basic Metabolic Panel:  Recent Labs Lab 03/16/16 0610 03/18/16 0545 03/18/16 1830 03/19/16 0630 03/20/16 0500 03/21/16 0600  NA 141 139 138 138 135 132*  133*  K 4.0 4.0 4.4 4.4 4.1 3.8  3.7  CL 102 97* 100* 99* 98* 95*  96*  CO2 _0 GLUCOSE 195* 336* 342* 184* 166* 155*  156*  BUN 128* 142* 147* 148* 147* 155*  155*  CREATININE 4.91* 5.08* 5.27* 5.55* 5.57* 5.89*  5.85*  CALCIUM 8.5* 8.7* 8.7* 8.4* 8.1* 8.1*  8.0*  MG 1.9 2.2  --  2.1 1.9 1.9  PHOS 3.3 5.0*  --  4.8* 4.7* 5.1*    Liver Function Tests:  Recent Labs Lab 03/16/16 0610 03/18/16 0545 03/19/16 0630 03/20/16 0500 03/21/16 0600  AST  --   --  34  --   --   ALT  --   --  64*  --   --   ALKPHOS  --   --  309*  --   --   BILITOT  --   --  0.8  --   --   PROT  --   --  5.5*  --   --   ALBUMIN 1.6* 1.6* 1.6* 1.5* 1.7*   No results for input(s): LIPASE, AMYLASE in the last 168 hours. No results for input(s): AMMONIA in the last 168 hours.  CBC:  Recent Labs Lab 03/16/16 0610  03/18/16 0545 03/18/16 1830 03/19/16 0630 03/20/16 0500  03/21/16 0600  WBC 9.4  < > 12.8* 20.4* 23.2* 18.1* 14.5*  NEUTROABS 6.8  --  11.4*  --  20.7* 15.4* 12.1*  HGB 8.9*  < > 9.0* 9.7* 8.9* 8.1* 7.3*  HCT 29.0*  < > 29.0* 31.5* 27.9* 25.0* 23.0*  MCV 95.1  < > 93.5 93.5 92.1 90.9 90.2  PLT 214  < > 259 349 366 284 272  < > = values in this interval not displayed.  Cardiac Enzymes: No results for input(s): CKTOTAL, CKMB, CKMBINDEX, TROPONINI in the last 168 hours.  BNP: Invalid input(s): POCBNP  CBG: No results for input(s): GLUCAP in the last 168 hours.  Microbiology: Results for orders placed or performed during the hospital encounter of 03/12/2016  Culture, Urine     Status: Abnormal   Collection Time: 03/08/16 11:20 PM  Result Value Ref Range Status   Specimen Description URINE, CLEAN CATCH  Final   Special Requests NONE  Final   Culture (A)  Final    >=100,000 COLONIES/mL ESCHERICHIA COLI Confirmed Extended Spectrum Beta-Lactamase Producer (ESBL)    Report Status 03/11/2016 FINAL  Final  Organism ID, Bacteria ESCHERICHIA COLI (A)  Final      Susceptibility   Escherichia coli - MIC*    AMPICILLIN >=32 RESISTANT Resistant     CEFAZOLIN >=64 RESISTANT Resistant     CEFTRIAXONE >=64 RESISTANT Resistant     CIPROFLOXACIN >=4 RESISTANT Resistant     GENTAMICIN <=1 SENSITIVE Sensitive     IMIPENEM <=0.25 SENSITIVE Sensitive     NITROFURANTOIN >=512 RESISTANT Resistant     TRIMETH/SULFA <=20 SENSITIVE Sensitive     AMPICILLIN/SULBACTAM >=32 RESISTANT Resistant     PIP/TAZO <=4 SENSITIVE Sensitive     Extended ESBL POSITIVE Resistant     * >=100,000 COLONIES/mL ESCHERICHIA COLI  C difficile quick scan w PCR reflex     Status: None   Collection Time: 03/09/16  5:17 AM  Result Value Ref Range Status   C Diff antigen NEGATIVE NEGATIVE Final   C Diff toxin NEGATIVE NEGATIVE Final   C Diff interpretation No C. difficile detected.  Final  C difficile quick scan w PCR reflex     Status: None   Collection Time: 03/19/16  9:24 AM   Result Value Ref Range Status   C Diff antigen NEGATIVE NEGATIVE Final   C Diff toxin NEGATIVE NEGATIVE Final   C Diff interpretation No C. difficile detected.  Final  Culture, respiratory (NON-Expectorated)     Status: None   Collection Time: 03/19/16  2:42 PM  Result Value Ref Range Status   Specimen Description TRACHEAL ASPIRATE  Final   Special Requests NONE  Final   Gram Stain   Final    MODERATE WBC PRESENT, PREDOMINANTLY PMN FEW BUDDING YEAST SEEN    Culture MODERATE CANDIDA ALBICANS  Final   Report Status 03/21/2016 FINAL  Final    Coagulation Studies:  Recent Labs  03/20/16 0500 03/21/16 0600  LABPROT 36.5* 41.3*  INR 3.56 4.17*    Urinalysis: No results for input(s): COLORURINE, LABSPEC, PHURINE, GLUCOSEU, HGBUR, BILIRUBINUR, KETONESUR, PROTEINUR, UROBILINOGEN, NITRITE, LEUKOCYTESUR in the last 72 hours.  Invalid input(s): APPERANCEUR    Imaging: Dg Chest Port 1 View  Result Date: 03/22/2016 CLINICAL DATA:  Pneumothorax pleural effusion EXAM: PORTABLE CHEST 1 VIEW COMPARISON:  Two days ago FINDINGS: Endotracheal tube tip between the clavicular heads and carina. An orogastric tube reaches stomach. Right upper extremity PICC has been retracted with tip at the distal right brachiocephalic vein. Large left pleural effusion obscuring most of the left lung. At least moderate right pleural effusion with underlying lung opacity. Pulmonary venous congestion at least. No pneumothorax. Non obscured postoperative heart size is stable. IMPRESSION: 1. Retracted right upper extremity PICC with tip at the right brachiocephalic level. 2. Large left and increased moderate right pleural effusion. The underlying lung is at least atelectatic. 3. Pulmonary venous congestion. Electronically Signed   By: Monte Fantasia M.D.   On: 03/22/2016 08:06     Medications:       Assessment/ Plan:  82 y.o. male with a PMHx of Coronary artery disease status post 3 vessel CABG, hyperlipidemia,  hypertension, malnutrition, chronic kidney disease stage IV followed by [facility] Nephrology, atrial fibrillation  who was admitted to [place] on 03/03/2016 for ongoing treatment of generalized debility, atrial fibrillation, pneumothorax, NSTEMI, anemia of chronic kidney disease was brought here for ongoing management. He was recently admitted to [facility]. He was felt to have a very overall poor prognosis. However family desired continued aggressive care.  1.  Acute renal failure. 2.  CKD stage  IV, seen by [facility] Nephrology as outpt, and evaluated by [facility] as recent inpt. 3.  Pulmonary Hypertension, peak PA pressure 57 mm Hg, Rheumatic mitral valve disease, Mod to severe Tricuspid regurgitation 4.  Atrial Fibrilliation with RVR 5.  Acute resp failure, large left pleural effusion, ventilator assisted 6.  Dependent Edema  Plan:  Patient appears to be a bit more arousable today. However renal function remains poor and Critically low. BUNs up to 155 with a creatinine of 5.9. However no large change in EGFR. As before the patient has been evaluated by 2 independent nephrology groups. It has been the opinion of both of these groups that the patient does not appear to be a dialysis candidate and we agree with these 2 prior assessments.   He has severe underlying valvular heart disease, Pulm HTN and A Fib. Patient is not expected to tolerate the volume shifts brought by dialysis.  Overall he remains critically ill at this point in time.   UOP 750 cc BUN/Cr remain critically high Not a good candidate for long term dialysis     LOS: 0 Tikesha Mort 9/27/20178:42 AM

## 2016-03-23 LAB — CBC WITH DIFFERENTIAL/PLATELET
BASOS PCT: 0 %
Basophils Absolute: 0 10*3/uL (ref 0.0–0.1)
EOS ABS: 0.1 10*3/uL (ref 0.0–0.7)
Eosinophils Relative: 1 %
HEMATOCRIT: 21.8 % — AB (ref 39.0–52.0)
Hemoglobin: 7.2 g/dL — ABNORMAL LOW (ref 13.0–17.0)
LYMPHS PCT: 14 %
Lymphs Abs: 1.9 10*3/uL (ref 0.7–4.0)
MCH: 29.6 pg (ref 26.0–34.0)
MCHC: 33 g/dL (ref 30.0–36.0)
MCV: 89.7 fL (ref 78.0–100.0)
MONOS PCT: 7 %
Monocytes Absolute: 0.9 10*3/uL (ref 0.1–1.0)
NEUTROS ABS: 10.5 10*3/uL — AB (ref 1.7–7.7)
Neutrophils Relative %: 78 %
Platelets: 296 10*3/uL (ref 150–400)
RBC: 2.43 MIL/uL — ABNORMAL LOW (ref 4.22–5.81)
RDW: 16 % — AB (ref 11.5–15.5)
WBC: 13.4 10*3/uL — ABNORMAL HIGH (ref 4.0–10.5)

## 2016-03-23 LAB — RENAL FUNCTION PANEL
Albumin: 2.1 g/dL — ABNORMAL LOW (ref 3.5–5.0)
Anion gap: 14 (ref 5–15)
BUN: 150 mg/dL — ABNORMAL HIGH (ref 6–20)
CHLORIDE: 94 mmol/L — AB (ref 101–111)
CO2: 22 mmol/L (ref 22–32)
Calcium: 8 mg/dL — ABNORMAL LOW (ref 8.9–10.3)
Creatinine, Ser: 6.36 mg/dL — ABNORMAL HIGH (ref 0.61–1.24)
GFR calc Af Amer: 8 mL/min — ABNORMAL LOW (ref >60)
GFR calc non Af Amer: 7 mL/min — ABNORMAL LOW (ref >60)
GLUCOSE: 120 mg/dL — AB (ref 65–99)
POTASSIUM: 3.7 mmol/L (ref 3.5–5.1)
Phosphorus: 6.3 mg/dL — ABNORMAL HIGH (ref 2.5–4.6)
Sodium: 130 mmol/L — ABNORMAL LOW (ref 135–145)

## 2016-03-23 LAB — PROTIME-INR
INR: 1.88
PROTHROMBIN TIME: 21.9 s — AB (ref 11.4–15.2)

## 2016-03-24 LAB — CBC WITH DIFFERENTIAL/PLATELET
BASOS PCT: 0 %
Basophils Absolute: 0 10*3/uL (ref 0.0–0.1)
EOS ABS: 0.2 10*3/uL (ref 0.0–0.7)
EOS PCT: 1 %
HCT: 22.8 % — ABNORMAL LOW (ref 39.0–52.0)
Hemoglobin: 7.5 g/dL — ABNORMAL LOW (ref 13.0–17.0)
LYMPHS ABS: 2.3 10*3/uL (ref 0.7–4.0)
Lymphocytes Relative: 17 %
MCH: 29.3 pg (ref 26.0–34.0)
MCHC: 32.9 g/dL (ref 30.0–36.0)
MCV: 89.1 fL (ref 78.0–100.0)
Monocytes Absolute: 1.1 10*3/uL — ABNORMAL HIGH (ref 0.1–1.0)
Monocytes Relative: 8 %
Neutro Abs: 10.1 10*3/uL — ABNORMAL HIGH (ref 1.7–7.7)
Neutrophils Relative %: 74 %
PLATELETS: 299 10*3/uL (ref 150–400)
RBC: 2.56 MIL/uL — AB (ref 4.22–5.81)
RDW: 15.8 % — ABNORMAL HIGH (ref 11.5–15.5)
WBC: 13.6 10*3/uL — AB (ref 4.0–10.5)

## 2016-03-24 LAB — RENAL FUNCTION PANEL
Albumin: 2.1 g/dL — ABNORMAL LOW (ref 3.5–5.0)
Anion gap: 15 (ref 5–15)
BUN: 151 mg/dL — ABNORMAL HIGH (ref 6–20)
CALCIUM: 8 mg/dL — AB (ref 8.9–10.3)
CO2: 21 mmol/L — AB (ref 22–32)
CREATININE: 6.42 mg/dL — AB (ref 0.61–1.24)
Chloride: 92 mmol/L — ABNORMAL LOW (ref 101–111)
GFR calc Af Amer: 8 mL/min — ABNORMAL LOW (ref >60)
GFR calc non Af Amer: 7 mL/min — ABNORMAL LOW (ref >60)
GLUCOSE: 68 mg/dL (ref 65–99)
Phosphorus: 6.5 mg/dL — ABNORMAL HIGH (ref 2.5–4.6)
Potassium: 3.5 mmol/L (ref 3.5–5.1)
SODIUM: 128 mmol/L — AB (ref 135–145)

## 2016-03-24 LAB — PROTIME-INR
INR: 1.86
PROTHROMBIN TIME: 21.7 s — AB (ref 11.4–15.2)

## 2016-03-24 LAB — PROCALCITONIN: Procalcitonin: 0.65 ng/mL

## 2016-03-24 NOTE — Progress Notes (Signed)
[facility]  ROUNDING NOTE   Subjective:  Patient remains critically ill at this point in time. Requiring levophed  Renal function remains poor BUN/CR remain critically high UOP 450 cc  Given iv albumin  X 3 days Vent assisted. Fio2 28%   Objective:  Vital signs in last 24 hours:  Temperature 96.7 pulse 82 respirations 20 blood pressure 92/49   Physical Exam: General: Critically ill appearing   Head: Normocephalic, atraumatic.  OGT, ETT  Eyes: Anicteric  Neck: Supple, trachea midline  Lungs:  Coarse rhonchi bilateral , vent assisted  Heart: S1S2 Irregular   Abdomen:  Soft, nontender, Bowel sounds present   Extremities: ++ dependent peripheral edema.  Neurologic: Resting quietly  Skin: Bilateral upper extremity ecchymoses        Basic Metabolic Panel:  Recent Labs Lab 03/18/16 0545  03/19/16 0630 03/20/16 0500 03/21/16 0600 03/22/16 1004 03/23/16 0500 03/24/16 0500  NA 139  < > 138 135 132*  133* 131* 130* 128*  K 4.0  < > 4.4 4.1 3.8  3.7 3.9 3.7 3.5  CL 97*  < > 99* 98* 95*  96* 96* 94* 92*  CO2 28  < > 26 25 24  24  19* 22 21*  GLUCOSE 336*  < > 184* 166* 155*  156* 81 120* 68  BUN 142*  < > 148* 147* 155*  155* 157* 150* 151*  CREATININE 5.08*  < > 5.55* 5.57* 5.89*  5.85* 6.08* 6.36* 6.42*  CALCIUM 8.7*  < > 8.4* 8.1* 8.1*  8.0* 8.0* 8.0* 8.0*  MG 2.2  --  2.1 1.9 1.9 2.0  --   --   PHOS 5.0*  --  4.8* 4.7* 5.1* 6.1* 6.3* 6.5*  < > = values in this interval not displayed.  Liver Function Tests:  Recent Labs Lab 03/19/16 0630 03/20/16 0500 03/21/16 0600 03/22/16 1004 03/23/16 0500 03/24/16 0500  AST 34  --   --   --   --   --   ALT 64*  --   --   --   --   --   ALKPHOS 309*  --   --   --   --   --   BILITOT 0.8  --   --   --   --   --   PROT 5.5*  --   --   --   --   --   ALBUMIN 1.6* 1.5* 1.7* 1.9* 2.1* 2.1*   No results for input(s): LIPASE, AMYLASE in the last 168 hours. No results for input(s): AMMONIA in the last  168 hours.  CBC:  Recent Labs Lab 03/20/16 0500 03/21/16 0600 03/22/16 1004 03/23/16 0500 03/24/16 0701  WBC 18.1* 14.5* 15.7* 13.4* 13.6*  NEUTROABS 15.4* 12.1* 12.8* 10.5* 10.1*  HGB 8.1* 7.3* 7.0* 7.2* 7.5*  HCT 25.0* 23.0* 21.4* 21.8* 22.8*  MCV 90.9 90.2 89.5 89.7 89.1  PLT 284 272 PLATELET CLUMPS NOTED ON SMEAR, COUNT APPEARS ADEQUATE 296 299    Cardiac Enzymes: No results for input(s): CKTOTAL, CKMB, CKMBINDEX, TROPONINI in the last 168 hours.  BNP: Invalid input(s): POCBNP  CBG: No results for input(s): GLUCAP in the last 168 hours.  Microbiology: Results for orders placed or performed during the hospital encounter of 03/02/2016  Culture, Urine     Status: Abnormal   Collection Time: 03/08/16 11:20 PM  Result Value Ref Range Status   Specimen Description URINE, CLEAN CATCH  Final   Special Requests NONE  Final   Culture (A)  Final    >=100,000 COLONIES/mL ESCHERICHIA COLI Confirmed Extended Spectrum Beta-Lactamase Producer (ESBL)    Report Status 03/11/2016 FINAL  Final   Organism ID, Bacteria ESCHERICHIA COLI (A)  Final      Susceptibility   Escherichia coli - MIC*    AMPICILLIN >=32 RESISTANT Resistant     CEFAZOLIN >=64 RESISTANT Resistant     CEFTRIAXONE >=64 RESISTANT Resistant     CIPROFLOXACIN >=4 RESISTANT Resistant     GENTAMICIN <=1 SENSITIVE Sensitive     IMIPENEM <=0.25 SENSITIVE Sensitive     NITROFURANTOIN >=512 RESISTANT Resistant     TRIMETH/SULFA <=20 SENSITIVE Sensitive     AMPICILLIN/SULBACTAM >=32 RESISTANT Resistant     PIP/TAZO <=4 SENSITIVE Sensitive     Extended ESBL POSITIVE Resistant     * >=100,000 COLONIES/mL ESCHERICHIA COLI  C difficile quick scan w PCR reflex     Status: None   Collection Time: 03/09/16  5:17 AM  Result Value Ref Range Status   C Diff antigen NEGATIVE NEGATIVE Final   C Diff toxin NEGATIVE NEGATIVE Final   C Diff interpretation No C. difficile detected.  Final  C difficile quick scan w PCR reflex      Status: None   Collection Time: 03/19/16  9:24 AM  Result Value Ref Range Status   C Diff antigen NEGATIVE NEGATIVE Final   C Diff toxin NEGATIVE NEGATIVE Final   C Diff interpretation No C. difficile detected.  Final  Culture, respiratory (NON-Expectorated)     Status: None   Collection Time: 03/19/16  2:42 PM  Result Value Ref Range Status   Specimen Description TRACHEAL ASPIRATE  Final   Special Requests NONE  Final   Gram Stain   Final    MODERATE WBC PRESENT, PREDOMINANTLY PMN FEW BUDDING YEAST SEEN    Culture MODERATE CANDIDA ALBICANS  Final   Report Status 03/21/2016 FINAL  Final    Coagulation Studies:  Recent Labs  03/22/16 1004 03/23/16 0500 03/24/16 0701  LABPROT 20.5* 21.9* 21.7*  INR 1.74 1.88 1.86    Urinalysis: No results for input(s): COLORURINE, LABSPEC, PHURINE, GLUCOSEU, HGBUR, BILIRUBINUR, KETONESUR, PROTEINUR, UROBILINOGEN, NITRITE, LEUKOCYTESUR in the last 72 hours.  Invalid input(s): APPERANCEUR    Imaging: No results found.   Medications:       Assessment/ Plan:  82 y.o. male with a PMHx of Coronary artery disease status post 3 vessel CABG, hyperlipidemia, hypertension, malnutrition, chronic kidney disease stage IV followed by [facility], atrial fibrillation  who was admitted to [place] on 03/17/2016 for ongoing treatment of generalized debility, atrial fibrillation, pneumothorax, NSTEMI, anemia of chronic kidney disease was brought here for ongoing management. He was recently admitted to [facility]. He was felt to have a very overall poor prognosis. However family desired continued aggressive care.  1.  Acute renal failure. 2.  CKD stage IV, seen by [facility] as outpt, and evaluated by [facility] as recent inpt. 3.  Pulmonary Hypertension, peak PA pressure 57 mm Hg, Rheumatic mitral valve disease, Mod to severe Tricuspid regurgitation 4.  Atrial Fibrilliation with RVR 5.  Acute resp failure, large left  pleural effusion, ventilator assisted 6.  Generalized Dependent Edema  Plan:  Patient remains criticall ill. Renal function remains poor and Critically low. BUNs up to 151 with a creatinine of 6.42. However no large change in EGFR. As before the patient has been evaluated by 2 independent nephrology groups. It has been the  opinion of both of these groups that the patient does not appear to be a dialysis candidate and we agree with these 2 prior assessments.   He has severe underlying valvular heart disease, Pulm HTN and A Fib. Patient is not expected to tolerate the volume shifts brought by dialysis.   UOP 450 cc BUN/Cr remain critically high Not a good candidate for long term dialysis   Overall prognosis is poor.     LOS: 0 Tyra Michelle 9/29/20178:57 AM

## 2016-03-25 LAB — CBC
HEMATOCRIT: 22.6 % — AB (ref 39.0–52.0)
HEMOGLOBIN: 7.5 g/dL — AB (ref 13.0–17.0)
MCH: 29.4 pg (ref 26.0–34.0)
MCHC: 33.2 g/dL (ref 30.0–36.0)
MCV: 88.6 fL (ref 78.0–100.0)
Platelets: 298 10*3/uL (ref 150–400)
RBC: 2.55 MIL/uL — AB (ref 4.22–5.81)
RDW: 15.6 % — ABNORMAL HIGH (ref 11.5–15.5)
WBC: 13.5 10*3/uL — AB (ref 4.0–10.5)

## 2016-03-25 LAB — BASIC METABOLIC PANEL
ANION GAP: 12 (ref 5–15)
BUN: 147 mg/dL — AB (ref 6–20)
CO2: 22 mmol/L (ref 22–32)
Calcium: 8 mg/dL — ABNORMAL LOW (ref 8.9–10.3)
Chloride: 91 mmol/L — ABNORMAL LOW (ref 101–111)
Creatinine, Ser: 6.45 mg/dL — ABNORMAL HIGH (ref 0.61–1.24)
GFR, EST AFRICAN AMERICAN: 8 mL/min — AB (ref >60)
GFR, EST NON AFRICAN AMERICAN: 7 mL/min — AB (ref >60)
Glucose, Bld: 84 mg/dL (ref 65–99)
POTASSIUM: 3.5 mmol/L (ref 3.5–5.1)
SODIUM: 125 mmol/L — AB (ref 135–145)

## 2016-03-25 LAB — PROTIME-INR
INR: 1.88
Prothrombin Time: 21.9 seconds — ABNORMAL HIGH (ref 11.4–15.2)

## 2016-03-26 LAB — BASIC METABOLIC PANEL
Anion gap: 13 (ref 5–15)
BUN: 132 mg/dL — AB (ref 6–20)
CALCIUM: 7.7 mg/dL — AB (ref 8.9–10.3)
CO2: 20 mmol/L — ABNORMAL LOW (ref 22–32)
CREATININE: 6.33 mg/dL — AB (ref 0.61–1.24)
Chloride: 90 mmol/L — ABNORMAL LOW (ref 101–111)
GFR calc non Af Amer: 7 mL/min — ABNORMAL LOW (ref >60)
GFR, EST AFRICAN AMERICAN: 8 mL/min — AB (ref >60)
Glucose, Bld: 238 mg/dL — ABNORMAL HIGH (ref 65–99)
Potassium: 4 mmol/L (ref 3.5–5.1)
SODIUM: 123 mmol/L — AB (ref 135–145)

## 2016-03-26 LAB — CBC
HCT: 21.9 % — ABNORMAL LOW (ref 39.0–52.0)
Hemoglobin: 7.3 g/dL — ABNORMAL LOW (ref 13.0–17.0)
MCH: 29.3 pg (ref 26.0–34.0)
MCHC: 33.3 g/dL (ref 30.0–36.0)
MCV: 88 fL (ref 78.0–100.0)
PLATELETS: 302 10*3/uL (ref 150–400)
RBC: 2.49 MIL/uL — AB (ref 4.22–5.81)
RDW: 15.4 % (ref 11.5–15.5)
WBC: 14.3 10*3/uL — AB (ref 4.0–10.5)

## 2016-03-26 LAB — CULTURE, BLOOD (ROUTINE X 2)

## 2016-03-26 LAB — PROTIME-INR
INR: 2.13
PROTHROMBIN TIME: 24.1 s — AB (ref 11.4–15.2)

## 2016-03-26 DEATH — deceased

## 2016-03-27 ENCOUNTER — Other Ambulatory Visit (HOSPITAL_COMMUNITY): Payer: Medicare Other

## 2016-03-27 LAB — MAGNESIUM: Magnesium: 1.9 mg/dL (ref 1.7–2.4)

## 2016-03-27 LAB — CBC WITH DIFFERENTIAL/PLATELET
BASOS PCT: 0 %
Basophils Absolute: 0 10*3/uL (ref 0.0–0.1)
Eosinophils Absolute: 0 10*3/uL (ref 0.0–0.7)
Eosinophils Relative: 0 %
HEMATOCRIT: 21.7 % — AB (ref 39.0–52.0)
HEMOGLOBIN: 7.5 g/dL — AB (ref 13.0–17.0)
LYMPHS ABS: 2 10*3/uL (ref 0.7–4.0)
Lymphocytes Relative: 15 %
MCH: 30 pg (ref 26.0–34.0)
MCHC: 34.6 g/dL (ref 30.0–36.0)
MCV: 86.8 fL (ref 78.0–100.0)
MONO ABS: 1 10*3/uL (ref 0.1–1.0)
MONOS PCT: 8 %
NEUTROS ABS: 10.1 10*3/uL — AB (ref 1.7–7.7)
NEUTROS PCT: 77 %
Platelets: 299 10*3/uL (ref 150–400)
RBC: 2.5 MIL/uL — ABNORMAL LOW (ref 4.22–5.81)
RDW: 15.6 % — AB (ref 11.5–15.5)
WBC: 13.2 10*3/uL — ABNORMAL HIGH (ref 4.0–10.5)

## 2016-03-27 LAB — RENAL FUNCTION PANEL
ANION GAP: 15 (ref 5–15)
Albumin: 1.7 g/dL — ABNORMAL LOW (ref 3.5–5.0)
BUN: 143 mg/dL — ABNORMAL HIGH (ref 6–20)
CHLORIDE: 90 mmol/L — AB (ref 101–111)
CO2: 17 mmol/L — AB (ref 22–32)
Calcium: 7.7 mg/dL — ABNORMAL LOW (ref 8.9–10.3)
Creatinine, Ser: 6.94 mg/dL — ABNORMAL HIGH (ref 0.61–1.24)
GFR calc non Af Amer: 6 mL/min — ABNORMAL LOW (ref >60)
GFR, EST AFRICAN AMERICAN: 7 mL/min — AB (ref >60)
GLUCOSE: 153 mg/dL — AB (ref 65–99)
POTASSIUM: 4.2 mmol/L (ref 3.5–5.1)
Phosphorus: 7.1 mg/dL — ABNORMAL HIGH (ref 2.5–4.6)
Sodium: 122 mmol/L — ABNORMAL LOW (ref 135–145)

## 2016-03-27 LAB — PROTIME-INR
INR: 2.16
PROTHROMBIN TIME: 24.5 s — AB (ref 11.4–15.2)

## 2016-03-27 NOTE — Progress Notes (Signed)
[facility]  ROUNDING NOTE   Subjective:  Overall status remains quite poor. The patient remains on the ventilator at this time. Renal function remains very low with a BUN of 143 and creatinine of 6.94. FiO2 on the ventilator is currently 28%. Patient not following commands.  Objective:  Vital signs in last 24 hours:  Temperature 96.7 pulse 82 respirations 20 blood pressure 92/49   Physical Exam: General: Critically ill appearing   Head: Normocephalic, atraumatic.  OGT, ETT  Eyes: Anicteric  Neck: Supple, trachea midline  Lungs:  Coarse rhonchi bilateral , vent assisted, FiO2 28%  Heart: S1S2 Irregular   Abdomen:  Soft, nontender, Bowel sounds present   Extremities: ++ dependent peripheral edema.  Neurologic: Not following commands  Skin: Bilateral upper extremity ecchymoses        Basic Metabolic Panel:  Recent Labs Lab 03/21/16 0600 03/22/16 1004 03/23/16 0500 03/24/16 0500 03/25/16 0500 03/26/16 0714 03/27/16 0629  NA 132*  133* 131* 130* 128* 125* 123* 122*  K 3.8  3.7 3.9 3.7 3.5 3.5 4.0 4.2  CL 95*  96* 96* 94* 92* 91* 90* 90*  CO2 24  24 19* 22 21* 22 20* 17*  GLUCOSE 155*  156* 81 120* 68 84 238* 153*  BUN 155*  155* 157* 150* 151* 147* 132* 143*  CREATININE 5.89*  5.85* 6.08* 6.36* 6.42* 6.45* 6.33* 6.94*  CALCIUM 8.1*  8.0* 8.0* 8.0* 8.0* 8.0* 7.7* 7.7*  MG 1.9 2.0  --   --   --   --  1.9  PHOS 5.1* 6.1* 6.3* 6.5*  --   --  7.1*    Liver Function Tests:  Recent Labs Lab 03/21/16 0600 03/22/16 1004 03/23/16 0500 03/24/16 0500 03/27/16 0629  ALBUMIN 1.7* 1.9* 2.1* 2.1* 1.7*   No results for input(s): LIPASE, AMYLASE in the last 168 hours. No results for input(s): AMMONIA in the last 168 hours.  CBC:  Recent Labs Lab 03/21/16 0600 03/22/16 1004 03/23/16 0500 03/24/16 0701 03/25/16 0500 03/26/16 0714 03/27/16 0629  WBC 14.5* 15.7* 13.4* 13.6* 13.5* 14.3* 13.2*  NEUTROABS 12.1* 12.8* 10.5* 10.1*  --   --  10.1*   HGB 7.3* 7.0* 7.2* 7.5* 7.5* 7.3* 7.5*  HCT 23.0* 21.4* 21.8* 22.8* 22.6* 21.9* 21.7*  MCV 90.2 89.5 89.7 89.1 88.6 88.0 86.8  PLT 272 PLATELET CLUMPS NOTED ON SMEAR, COUNT APPEARS ADEQUATE 296 299 298 302 299    Cardiac Enzymes: No results for input(s): CKTOTAL, CKMB, CKMBINDEX, TROPONINI in the last 168 hours.  BNP: Invalid input(s): POCBNP  CBG: No results for input(s): GLUCAP in the last 168 hours.  Microbiology: Results for orders placed or performed during the hospital encounter of 03/24/2016  Culture, Urine     Status: Abnormal   Collection Time: 03/08/16 11:20 PM  Result Value Ref Range Status   Specimen Description URINE, CLEAN CATCH  Final   Special Requests NONE  Final   Culture (A)  Final    >=100,000 COLONIES/mL ESCHERICHIA COLI Confirmed Extended Spectrum Beta-Lactamase Producer (ESBL)    Report Status 03/11/2016 FINAL  Final   Organism ID, Bacteria ESCHERICHIA COLI (A)  Final      Susceptibility   Escherichia coli - MIC*    AMPICILLIN >=32 RESISTANT Resistant     CEFAZOLIN >=64 RESISTANT Resistant     CEFTRIAXONE >=64 RESISTANT Resistant     CIPROFLOXACIN >=4 RESISTANT Resistant     GENTAMICIN <=1 SENSITIVE Sensitive     IMIPENEM <=0.25 SENSITIVE Sensitive  NITROFURANTOIN >=512 RESISTANT Resistant     TRIMETH/SULFA <=20 SENSITIVE Sensitive     AMPICILLIN/SULBACTAM >=32 RESISTANT Resistant     PIP/TAZO <=4 SENSITIVE Sensitive     Extended ESBL POSITIVE Resistant     * >=100,000 COLONIES/mL ESCHERICHIA COLI  C difficile quick scan w PCR reflex     Status: None   Collection Time: 03/09/16  5:17 AM  Result Value Ref Range Status   C Diff antigen NEGATIVE NEGATIVE Final   C Diff toxin NEGATIVE NEGATIVE Final   C Diff interpretation No C. difficile detected.  Final  C difficile quick scan w PCR reflex     Status: None   Collection Time: 03/19/16  9:24 AM  Result Value Ref Range Status   C Diff antigen NEGATIVE NEGATIVE Final   C Diff toxin NEGATIVE  NEGATIVE Final   C Diff interpretation No C. difficile detected.  Final  Culture, respiratory (NON-Expectorated)     Status: None   Collection Time: 03/19/16  2:42 PM  Result Value Ref Range Status   Specimen Description TRACHEAL ASPIRATE  Final   Special Requests NONE  Final   Gram Stain   Final    MODERATE WBC PRESENT, PREDOMINANTLY PMN FEW BUDDING YEAST SEEN    Culture MODERATE CANDIDA ALBICANS  Final   Report Status 03/21/2016 FINAL  Final  Culture, blood (routine x 2)     Status: Abnormal   Collection Time: 03/23/16  2:30 PM  Result Value Ref Range Status   Specimen Description BLOOD LEFT ARM  Final   Special Requests IN PEDIATRIC BOTTLE 3CC  Final   Culture  Setup Time   Final    AEROBIC BOTTLE ONLY GRAM POSITIVE COCCI IN CLUSTERS CRITICAL RESULT CALLED TO, READ BACK BY AND VERIFIED WITH: H. TATA, RN [facility]) AT 2000 ON 03/24/16 BY C. JESSUP, MLT.    Culture STAPHYLOCOCCUS SPECIES (COAGULASE NEGATIVE) (A)  Final   Report Status 03/26/2016 FINAL  Final   Organism ID, Bacteria STAPHYLOCOCCUS SPECIES (COAGULASE NEGATIVE)  Final      Susceptibility   Staphylococcus species (coagulase negative) - MIC*    CIPROFLOXACIN >=8 RESISTANT Resistant     ERYTHROMYCIN >=8 RESISTANT Resistant     GENTAMICIN >=16 RESISTANT Resistant     OXACILLIN >=4 RESISTANT Resistant     TETRACYCLINE 2 SENSITIVE Sensitive     VANCOMYCIN 1 SENSITIVE Sensitive     TRIMETH/SULFA 160 RESISTANT Resistant     CLINDAMYCIN >=8 RESISTANT Resistant     RIFAMPIN >=32 RESISTANT Resistant     Inducible Clindamycin NEGATIVE Sensitive     * STAPHYLOCOCCUS SPECIES (COAGULASE NEGATIVE)  Culture, blood (routine x 2)     Status: Abnormal   Collection Time: 03/23/16  2:40 PM  Result Value Ref Range Status   Specimen Description BLOOD RIGHT HAND  Final   Special Requests IN PEDIATRIC BOTTLE 3CC  Final   Culture  Setup Time   Final    GRAM POSITIVE COCCI IN CLUSTERS AEROBIC BOTTLE ONLY CRITICAL VALUE NOTED.  VALUE IS  CONSISTENT WITH PREVIOUSLY REPORTED AND CALLED VALUE.    Culture (A)  Final    STAPHYLOCOCCUS SPECIES (COAGULASE NEGATIVE) SUSCEPTIBILITIES PERFORMED ON PREVIOUS CULTURE WITHIN THE LAST 5 DAYS.    Report Status 03/26/2016 FINAL  Final    Coagulation Studies:  Recent Labs  03/25/16 0500 03/26/16 0714 03/27/16 0629  LABPROT 21.9* 24.1* 24.5*  INR 1.88 2.13 2.16    Urinalysis: No results for input(s): COLORURINE, LABSPEC, PHURINE, GLUCOSEU, HGBUR,  BILIRUBINUR, KETONESUR, PROTEINUR, UROBILINOGEN, NITRITE, LEUKOCYTESUR in the last 72 hours.  Invalid input(s): APPERANCEUR    Imaging: Dg Chest Port 1 View  Result Date: 03/27/2016 CLINICAL DATA:  Respiratory failure, pleural effusion. EXAM: PORTABLE CHEST 1 VIEW COMPARISON:  Portable chest x-ray of March 22, 2016 FINDINGS: A large left pleural effusion is present but there is some improved aeration of the left lung. On the right a smaller effusion is stable. The interstitial markings of both lungs are increased. The cardiac silhouette is normal in size. There are post CABG changes. The pulmonary vascularity is prominent but more distinct. The endotracheal tube tip lies 2.8 cm above the carina. The esophagogastric tube tip projects below the inferior margin of the image. The proximal port appears to lie at the GE junction. The PICC line tip projects at the junction of the right and left brachiocephalic veins. IMPRESSION: Slight improved aeration of the left lung. Persistent large left pleural effusion with smaller right pleural effusion. Mild decrease in pulmonary interstitial edema. Advancement of the esophagogastric tube by 5-10 cm would be useful to assure that the proximal port lies below the GE junction. Electronically Signed   By: David  SwazilandJordan M.D.   On: 03/27/2016 07:35     Medications:       Assessment/ Plan:  82 y.o. male with a PMHx of Coronary artery disease status post 3 vessel CABG, hyperlipidemia, hypertension,  malnutrition, chronic kidney disease stage IV [facility], atrial fibrillation  who was admitted to [facility] on 04/08/16 for ongoing treatment of generalized debility, atrial fibrillation, pneumothorax, NSTEMI, anemia of chronic kidney disease was brought here for ongoing management. [facility] . He was felt to have a very overall poor prognosis. However family desired continued aggressive care.  1.  Acute renal failure. 2.  CKD stage IV, [facility], and evaluated by [facility] as recent inpt.  Patient felt not to be a candidate for dialysis by 2 independent nephrology groups. 3.  Pulmonary Hypertension, peak PA pressure 57 mm Hg, Rheumatic mitral valve disease, Mod to severe Tricuspid regurgitation 4.  Atrial Fibrilliation with RVR 5.  Acute resp failure, large left pleural effusion, ventilator assisted 6.  Generalized Dependent Edema  Plan:  Overall the patient remains critically ill. The main issues remain acute respiratory failure, acute renal failure, pulmonary hypertension, atrial fibrillation, and multiple electrolyte abnormalities. As before the patient has been evaluated by 2 independent nephrology groups. Both of these independent assessments of the patient indicated that he would not make a good dialysis candidate given his age and significant comorbidities. We agree with these 2 assessments. Family continues to desire aggressive care however. Continue supportive care as per hospitalist.    LOS: 0 Erminia Mcnew 10/2/20172:33 PM

## 2016-03-28 LAB — CBC WITH DIFFERENTIAL/PLATELET
BASOS PCT: 0 %
Basophils Absolute: 0 10*3/uL (ref 0.0–0.1)
Eosinophils Absolute: 0 10*3/uL (ref 0.0–0.7)
Eosinophils Relative: 0 %
HEMATOCRIT: 23.7 % — AB (ref 39.0–52.0)
Hemoglobin: 8 g/dL — ABNORMAL LOW (ref 13.0–17.0)
Lymphocytes Relative: 15 %
Lymphs Abs: 2 10*3/uL (ref 0.7–4.0)
MCH: 29.4 pg (ref 26.0–34.0)
MCHC: 33.8 g/dL (ref 30.0–36.0)
MCV: 87.1 fL (ref 78.0–100.0)
MONO ABS: 1 10*3/uL (ref 0.1–1.0)
MONOS PCT: 7 %
NEUTROS ABS: 10.9 10*3/uL — AB (ref 1.7–7.7)
Neutrophils Relative %: 78 %
Platelets: 301 10*3/uL (ref 150–400)
RBC: 2.72 MIL/uL — ABNORMAL LOW (ref 4.22–5.81)
RDW: 15.3 % (ref 11.5–15.5)
WBC: 13.9 10*3/uL — ABNORMAL HIGH (ref 4.0–10.5)

## 2016-03-28 LAB — PROTIME-INR
INR: 2.11
PROTHROMBIN TIME: 23.9 s — AB (ref 11.4–15.2)

## 2016-03-28 LAB — RENAL FUNCTION PANEL
ALBUMIN: 1.6 g/dL — AB (ref 3.5–5.0)
Anion gap: 16 — ABNORMAL HIGH (ref 5–15)
BUN: 139 mg/dL — AB (ref 6–20)
CHLORIDE: 86 mmol/L — AB (ref 101–111)
CO2: 18 mmol/L — AB (ref 22–32)
Calcium: 8 mg/dL — ABNORMAL LOW (ref 8.9–10.3)
Creatinine, Ser: 6.94 mg/dL — ABNORMAL HIGH (ref 0.61–1.24)
GFR calc Af Amer: 7 mL/min — ABNORMAL LOW (ref >60)
GFR calc non Af Amer: 6 mL/min — ABNORMAL LOW (ref >60)
GLUCOSE: 137 mg/dL — AB (ref 65–99)
POTASSIUM: 4.5 mmol/L (ref 3.5–5.1)
Phosphorus: 7.9 mg/dL — ABNORMAL HIGH (ref 2.5–4.6)
Sodium: 120 mmol/L — ABNORMAL LOW (ref 135–145)

## 2016-03-28 LAB — HEPATIC FUNCTION PANEL
ALT: 8 U/L — AB (ref 17–63)
AST: 17 U/L (ref 15–41)
Albumin: 1.5 g/dL — ABNORMAL LOW (ref 3.5–5.0)
Alkaline Phosphatase: 203 U/L — ABNORMAL HIGH (ref 38–126)
BILIRUBIN DIRECT: 0.6 mg/dL — AB (ref 0.1–0.5)
BILIRUBIN TOTAL: 1.7 mg/dL — AB (ref 0.3–1.2)
Indirect Bilirubin: 1.1 mg/dL — ABNORMAL HIGH (ref 0.3–0.9)
Total Protein: 4.5 g/dL — ABNORMAL LOW (ref 6.5–8.1)

## 2016-03-28 LAB — MAGNESIUM: Magnesium: 2 mg/dL (ref 1.7–2.4)

## 2016-03-29 DIAGNOSIS — Z4659 Encounter for fitting and adjustment of other gastrointestinal appliance and device: Secondary | ICD-10-CM

## 2016-03-29 DIAGNOSIS — J9600 Acute respiratory failure, unspecified whether with hypoxia or hypercapnia: Secondary | ICD-10-CM

## 2016-03-29 DIAGNOSIS — Z978 Presence of other specified devices: Secondary | ICD-10-CM | POA: Diagnosis not present

## 2016-03-29 DIAGNOSIS — Z9911 Dependence on respirator [ventilator] status: Secondary | ICD-10-CM

## 2016-03-29 DIAGNOSIS — Z9689 Presence of other specified functional implants: Secondary | ICD-10-CM

## 2016-03-29 DIAGNOSIS — I479 Paroxysmal tachycardia, unspecified: Secondary | ICD-10-CM

## 2016-03-29 DIAGNOSIS — R627 Adult failure to thrive: Secondary | ICD-10-CM

## 2016-03-29 LAB — PROTIME-INR
INR: 2.2
Prothrombin Time: 24.8 seconds — ABNORMAL HIGH (ref 11.4–15.2)

## 2016-03-29 NOTE — Consult Note (Signed)
Name: [name] [name] MRN: [id number] DOB: 08-14-1927    ADMISSION DATE:  19-Mar-2016 CONSULTATION DATE:  10/4  REFERRING MD :  [facility]  CHIEF COMPLAINT: FTT  BRIEF PATIENT DESCRIPTION:Frail wm  SIGNIFICANT EVENTS  9/25 intubated>>  STUDIES:     HISTORY OF PRESENT ILLNESS:   [name] [name] is an unfortunate 82 yo WM with multiple medical issues(as noted above) that preclude him from any meaningful recovery. He is known to the PCCM team and on initial consult 03/03/16 it is well documented that his prognosis was poor and would not  recommend intubation. CVTS  Surgery Dr. Dorris Fetch evaluated him 9/12 and stated he was not a surgical candidate.  Further Renal service states he is not a candidate for hemodialysis.  PCCM is asked to consult 10/4 as [name] [name] has been intubated for 10 days and care has reached futile stage.  PCCM has little to offer at this time. Would suggest palliation with goal of comfort.  PAST MEDICAL HISTORY :   has a past medical history of Arthritis; Bilateral inguinal hernia (BIH), left scrotal (11/28/2011); C. difficile colitis; CAD; HYPERLIPIDEMIA; HYPERTENSION; Malnutrition (HCC); NSTEMI (non-ST elevated myocardial infarction) (HCC); Pneumonia (1943); RENAL INSUFFICIENCY; and Type II diabetes mellitus (HCC).  has a past surgical history that includes Appendectomy; Tonsillectomy; Cataract extraction w/ intraocular lens  implant, bilateral (Bilateral); Coronary angioplasty with stent (2010); Coronary artery bypass graft (~ 1999); Scrotal surgery (Left); and ir generic historical (03/03/2016). Prior to Admission medications   Medication Sig Start Date End Date Taking? Authorizing Provider  acetaminophen (TYLENOL) 650 MG CR tablet Take 650 mg by mouth every 4 (four) hours as needed for pain.    Historical Provider, MD  Amino Acids-Protein Hydrolys (FEEDING SUPPLEMENT, PRO-STAT SUGAR FREE 64,) LIQD Take 30 mLs by mouth 2 (two) times daily. 03-19-16   [name], MD  aspirin EC  81 MG EC tablet Take 1 tablet (81 mg total) by mouth daily. March 19, 2016   [name], MD  escitalopram (LEXAPRO) 10 MG tablet Take 10 mg by mouth daily.  11/18/15   Historical Provider, MD  feeding supplement, ENSURE ENLIVE, (ENSURE ENLIVE) LIQD Take 237 mLs by mouth 2 (two) times daily between meals. 03-19-2016   [name], MD  metoprolol tartrate (LOPRESSOR) 25 MG tablet Take 0.5 tablets (12.5 mg total) by mouth 2 (two) times daily. 03/19/2016   [name], MD  Multiple Vitamins-Minerals (EYE VITAMINS) CAPS Take 1 capsule by mouth daily.     Historical Provider, MD  multivitamin (RENA-VIT) TABS tablet Take 1 tablet by mouth daily.    Historical Provider, MD  NITROSTAT 0.4 MG SL tablet DISSOLVE ONE TABLET UNDER THE TONGUE EVERY 5 MINUTES AS NEEDED FOR CHEST PAIN.  DO NOT EXCEED A TOTAL OF 3 DOSES IN 15 MINUTES Patient taking differently: Place 0.4 mg under the tongue every 5 (five) minutes as needed for chest pain (Do not exceed a total of 3 doses in 15 minutes).  11/14/13   [name], MD  Probiotic Product (ALIGN) 4 MG CAPS Take 4 mg by mouth daily.    Historical Provider, MD  sodium bicarbonate 650 MG tablet Take 1,300 mg by mouth 2 (two) times daily.    Historical Provider, MD   Allergies  Allergen Reactions  . Statins Other (See Comments)    Per [facility] at [facility]    FAMILY HISTORY:  family history is not on file. SOCIAL HISTORY:  reports that he has never smoked. He has never used smokeless tobacco.  He reports that he does not drink alcohol or use drugs.  REVIEW OF SYSTEMS:   na.  SUBJECTIVE: frail male  VITAL SIGNS:  98.6 88 rr 26 104/60 on levophed  Vent: Ac/vc+ 550 rr 24/+4 28% 5 peep  PHYSICAL EXAMINATION: General: Frail wasted juandice wm on vent Neuro:  Opens eyes to voice , no follws comands HEENT:  OTT-> vent NGT-> TF Cardiovascular: HSIR IR Lungs:  Coarse rhonchi, decreased bs bases Abdomen:  Tense, few bs Musculoskeletal: wasted musculature Skin: Cool  damp   Recent Labs Lab 03/26/16 0714 03/27/16 0629 03/28/16 0539  NA 123* 122* 120*  K 4.0 4.2 4.5  CL 90* 90* 86*  CO2 20* 17* 18*  BUN 132* 143* 139*  CREATININE 6.33* 6.94* 6.94*  GLUCOSE 238* 153* 137*    Recent Labs Lab 03/26/16 0714 03/27/16 0629 03/28/16 0539  HGB 7.3* 7.5* 8.0*  HCT 21.9* 21.7* 23.7*  WBC 14.3* 13.2* 13.9*  PLT 302 299 301   Dg Abd Portable 1v  Result Date: 03/27/2016 CLINICAL DATA:  NG tube placement EXAM: PORTABLE ABDOMEN - 1 VIEW COMPARISON:  None. FINDINGS: Orogastric tube with tip in the gastric body. Mildly dilated loop of small bowel. IMPRESSION: Orogastric tube in the stomach. Electronically Signed   By: Genevive BiStewart  Edmunds M.D.   On: 03/27/2016 15:17    ASSESSMENT    Ventilator dependence (HCC) he is not weanable    Acute [facility] failure [facility]    FTT (failure to thrive) in adult   Coronary atherosclerosis   Disorder resulting from impaired renal function   Atrial fibrillation (HCC)   Acute diastolic congestive heart failure (HCC)   Chronic kidney disease (CKD), stage IV (severe) (HCC)         Discussion: [name] [name] is an unfortunate 82 yo WM with multiple medical issues(as noted above) that preclude him from any meaningful recovery. He is known to the PCCM team and on initial consult 03/03/16 it is well documented that his prognosis was poor and would recommend intubation. CVTS  Surgery Dr. Dorris FetchHendrickson evaluated him 9/12 and stated he was not a surgical candidate.  Further Renal service states he is not a candidate for hemodialysis.  PCCM is asked to consult 10/4 as [name] [name] has been intubated for 10 days and care has reached futile stage.  PCCM has little to offer at this time. Would suggest palliation with goal of comfort. [facility].   PLAN: 1. Palliative care consult 2. Terminal wean.  Brett CanalesSteve Minor ACNP [facility] PCCM Pager [phone] till 3 pm If no answer page [phone] 03/29/2016,  11:47 AM   STAFF NOTE: I, Rory Percyaniel Damondre Pfeifle, MD FACP have personally reviewed patient's available data, including medical history, events of note, physical examination and test results as part of my evaluation. I have discussed with resident/NP and other care providers such as pharmacist, RN and RRT. In addition, I personally evaluated patient and elicited key findings of: well known to me and our service, he is actively dying, low muscle mass one examination, ronchi, edema severe and heroics would be futile, he is NOT candidate for HD and multiple other medical interventions, he is suffering on ventilator and is immoral and unethical to continued support for him, likely need to repeat abg and reduce rate, would first address goals of care, see my note where we stated that he is NOT a candidate for mechanical ventilation, would recommend palliation in full, what we are doing  to this poor man is cruel and unethical, all current support is futile and not medically ineffective, no family in room The patient is critically ill with multiple organ systems failure and requires high complexity decision making for assessment and support, frequent evaluation and titration of therapies, application of advanced monitoring technologies and extensive interpretation of multiple databases.   Critical Care Time devoted to patient care services described in this note is 35 Minutes. This time reflects time of care of this signee: Rory Percy, MD FACP. This critical care time does not reflect procedure time, or teaching time or supervisory time of PA/NP/Med student/Med Resident etc but could involve care discussion time. Rest per NP/medical resident whose note is outlined above and that I agree with   Mcarthur Rossetti. Tyson Alias, MD, FACP Pgr: [phone] [facility] 03/29/2016 1:54 PM

## 2016-03-30 DIAGNOSIS — Z9911 Dependence on respirator [ventilator] status: Secondary | ICD-10-CM | POA: Diagnosis not present

## 2016-03-30 DIAGNOSIS — J9601 Acute respiratory failure with hypoxia: Secondary | ICD-10-CM | POA: Diagnosis not present

## 2016-03-30 DIAGNOSIS — Z515 Encounter for palliative care: Secondary | ICD-10-CM

## 2016-03-30 LAB — CBC WITH DIFFERENTIAL/PLATELET
Basophils Absolute: 0 10*3/uL (ref 0.0–0.1)
Basophils Relative: 0 %
EOS PCT: 0 %
Eosinophils Absolute: 0 10*3/uL (ref 0.0–0.7)
HEMATOCRIT: 21.2 % — AB (ref 39.0–52.0)
HEMOGLOBIN: 7.5 g/dL — AB (ref 13.0–17.0)
Lymphocytes Relative: 8 %
Lymphs Abs: 1.6 10*3/uL (ref 0.7–4.0)
MCH: 30.1 pg (ref 26.0–34.0)
MCHC: 35.4 g/dL (ref 30.0–36.0)
MCV: 85.1 fL (ref 78.0–100.0)
MONOS PCT: 4 %
Monocytes Absolute: 0.8 10*3/uL (ref 0.1–1.0)
NEUTROS PCT: 88 %
Neutro Abs: 17.1 10*3/uL — ABNORMAL HIGH (ref 1.7–7.7)
Platelets: 255 10*3/uL (ref 150–400)
RBC: 2.49 MIL/uL — AB (ref 4.22–5.81)
RDW: 14.9 % (ref 11.5–15.5)
WBC: 19.5 10*3/uL — AB (ref 4.0–10.5)

## 2016-03-30 LAB — RENAL FUNCTION PANEL
ALBUMIN: 1.4 g/dL — AB (ref 3.5–5.0)
Anion gap: 16 — ABNORMAL HIGH (ref 5–15)
BUN: 145 mg/dL — AB (ref 6–20)
CHLORIDE: 82 mmol/L — AB (ref 101–111)
CO2: 16 mmol/L — ABNORMAL LOW (ref 22–32)
Calcium: 7.6 mg/dL — ABNORMAL LOW (ref 8.9–10.3)
Creatinine, Ser: 7.02 mg/dL — ABNORMAL HIGH (ref 0.61–1.24)
GFR calc Af Amer: 7 mL/min — ABNORMAL LOW (ref >60)
GFR calc non Af Amer: 6 mL/min — ABNORMAL LOW (ref >60)
GLUCOSE: 191 mg/dL — AB (ref 65–99)
PHOSPHORUS: 8.1 mg/dL — AB (ref 2.5–4.6)
POTASSIUM: 4.6 mmol/L (ref 3.5–5.1)
Sodium: 114 mmol/L — CL (ref 135–145)

## 2016-03-30 LAB — PROTIME-INR
INR: 2.39
Prothrombin Time: 26.5 seconds — ABNORMAL HIGH (ref 11.4–15.2)

## 2016-03-30 LAB — MAGNESIUM: Magnesium: 1.9 mg/dL (ref 1.7–2.4)

## 2016-03-30 NOTE — Progress Notes (Signed)
Name: [name] [name] MRN: [id number] DOB: Mar 21, 1928    ADMISSION DATE:  03/10/2016 CONSULTATION DATE:  10/4  REFERRING MD :  [facility]  CHIEF COMPLAINT: FTT  BRIEF PATIENT DESCRIPTION:Frail wm  SIGNIFICANT EVENTS  9/25 intubated>>  STUDIES:     HISTORY OF PRESENT ILLNESS:   [name] [name] is an unfortunate 82 yo WM with multiple medical issues(as noted above) that preclude him from any meaningful recovery. He is known to the PCCM team and on initial consult 03/03/16 it is well documented that his prognosis was poor and would not  recommend intubation. CVTS  Surgery [name] [name] evaluated him 9/12 and stated he was not a surgical candidate.  Further Renal service states he is not a candidate for hemodialysis.  PCCM is asked to consult 10/4 as [name] [name] has been intubated for 10 days and care has reached futile stage.  PCCM has little to offer at this time. Would suggest palliation with goal of comfort.    SUBJECTIVE: frail male  VITAL SIGNS:  98.6 87 rr 26 102/68 on levophed 23 mcg  Vent: Ac/vc+ 550 rr 24/+2 28% 5 peep  PHYSICAL EXAMINATION: General: Frail wasted juandice wm on vent. NSC Neuro:  Opens eyes to voice , no follows comands HEENT:  OTT-> vent NGT-> TF Cardiovascular: HSIR IR Lungs:  Coarse rhonchi, decreased bs bases Abdomen:  Tense, few bs Musculoskeletal: wasted musculature Skin: Cool damp   Recent Labs Lab 03/26/16 0714 03/27/16 0629 03/28/16 0539  NA 123* 122* 120*  K 4.0 4.2 4.5  CL 90* 90* 86*  CO2 20* 17* 18*  BUN 132* 143* 139*  CREATININE 6.33* 6.94* 6.94*  GLUCOSE 238* 153* 137*    Recent Labs Lab 03/26/16 0714 03/27/16 0629 03/28/16 0539  HGB 7.3* 7.5* 8.0*  HCT 21.9* 21.7* 23.7*  WBC 14.3* 13.2* 13.9*  PLT 302 299 301   No results found.  ASSESSMENT    Ventilator dependence (HCC) he is not weanable    Acute respiratory failure (HCC) intubated in [facility]    FTT (failure to thrive) in adult   Coronary atherosclerosis  Disorder resulting from impaired renal function   Atrial fibrillation (HCC)   Acute diastolic congestive heart failure (HCC)   Chronic kidney disease (CKD), stage IV (severe) (HCC)         Discussion: [name] [name] is an unfortunate 82 yo WM with multiple medical issues(as noted above) that preclude him from any meaningful recovery. He is known to the PCCM team and on initial consult 03/03/16 it is well documented that his prognosis was poor and would recommend intubation. CVTS  Surgery [name] [name] evaluated him 9/12 and stated he was not a surgical candidate.  Further Renal service states he is not a candidate for hemodialysis.  PCCM is asked to consult 10/4 as [name] [name] has been intubated for 10 days and care has reached futile stage.  PCCM has little to offer at this time. Would suggest palliation with goal of comfort. He is on pressors per [facility] team. 10/5 PCCM will sign off.   PLAN: 1. Palliative care consult 2. Terminal wean. 3. 10/5 no family at bedside. PCCM has nothing to offer therefore we will sign off.  [name] [name] ACNP [name] [name] PCCM Pager [phone] till 3 pm If no answer page [phone] 03/30/2016, 10:29 AM   Attending Note:  82 year old male with multiple medical problems who is extremely debilitated and should have never been intubated who is now intubated and not weaning.  On exam, patient is completely unresponsive and is not a trach candidate.  I reviewed CXR myself, pulmonary edema noted and infiltrate noted.  Failing wean due to deconditioning and muscle wasting.  In my opinion, the patient's care is futile and should be changed to full comfort care and extubate.  PCCM will sign off.  May need ethics involvement if family wishes to continue this futile and in my opinion unethical work.  The patient is critically ill with multiple organ systems failure and requires high complexity decision making for assessment and support, frequent evaluation and titration of therapies,  application of advanced monitoring technologies and extensive interpretation of multiple databases.   Critical Care Time devoted to patient care services described in this note is  35  Minutes. This time reflects time of care of this signee [name] [name]. This critical care time does not reflect procedure time, or teaching time or supervisory time of PA/NP/Med student/Med Resident etc but could involve care discussion time.  [name] [name], M.D. [facility]. Pager: [phone]. After hours pager: [phone].

## 2016-03-31 LAB — HEPATIC FUNCTION PANEL
ALBUMIN: 1.5 g/dL — AB (ref 3.5–5.0)
ALK PHOS: 364 U/L — AB (ref 38–126)
ALT: 9 U/L — ABNORMAL LOW (ref 17–63)
AST: 26 U/L (ref 15–41)
BILIRUBIN TOTAL: 1.8 mg/dL — AB (ref 0.3–1.2)
Bilirubin, Direct: 1.1 mg/dL — ABNORMAL HIGH (ref 0.1–0.5)
Indirect Bilirubin: 0.7 mg/dL (ref 0.3–0.9)
Total Protein: 4.6 g/dL — ABNORMAL LOW (ref 6.5–8.1)

## 2016-03-31 LAB — BASIC METABOLIC PANEL
Anion gap: 15 (ref 5–15)
BUN: 149 mg/dL — AB (ref 6–20)
CHLORIDE: 82 mmol/L — AB (ref 101–111)
CO2: 17 mmol/L — AB (ref 22–32)
CREATININE: 7.02 mg/dL — AB (ref 0.61–1.24)
Calcium: 7.7 mg/dL — ABNORMAL LOW (ref 8.9–10.3)
GFR calc non Af Amer: 6 mL/min — ABNORMAL LOW (ref >60)
GFR, EST AFRICAN AMERICAN: 7 mL/min — AB (ref >60)
GLUCOSE: 184 mg/dL — AB (ref 65–99)
Potassium: 5.1 mmol/L (ref 3.5–5.1)
Sodium: 114 mmol/L — CL (ref 135–145)

## 2016-03-31 NOTE — Progress Notes (Addendum)
[facility]  ROUNDING NOTE   Subjective:  Called back to see patient and to help conduct family meeting today. Patient remains critically ill at this point in time.  He remains on the ventilator. He is arousable but not following commands.    Objective:  Vital signs in last 24 hours:  Temperature:  95.2 Pulse: 87  Respirations: 16  BP: 149/98  Physical Exam: General: Critically ill appearing   Head: Normocephalic, atraumatic.  OGT, ETT  Eyes: Anicteric  Neck: Supple, trachea midline  Lungs:  Coarse rhonchi bilateral , vent assisted  Heart: S1S2 Irregular   Abdomen:  Soft, nontender, Bowel sounds present   Extremities: 2+ generalzied edema  Neurologic: arousable but not following commands  Skin: Bilateral upper extremity ecchymoses        Basic Metabolic Panel:  Recent Labs Lab 03/26/16 0714 03/27/16 0629 03/28/16 0539 03/30/16 1617 03/31/16 0545  NA 123* 122* 120* 114* 114*  K 4.0 4.2 4.5 4.6 5.1  CL 90* 90* 86* 82* 82*  CO2 20* 17* 18* 16* 17*  GLUCOSE 238* 153* 137* 191* 184*  BUN 132* 143* 139* 145* 149*  CREATININE 6.33* 6.94* 6.94* 7.02* 7.02*  CALCIUM 7.7* 7.7* 8.0* 7.6* 7.7*  MG  --  1.9 2.0 1.9  --   PHOS  --  7.1* 7.9* 8.1*  --     Liver Function Tests:  Recent Labs Lab 03/27/16 0629 03/28/16 0539 03/28/16 1530 03/30/16 1617 03/31/16 0545  AST  --   --  17  --  26  ALT  --   --  8*  --  9*  ALKPHOS  --   --  203*  --  364*  BILITOT  --   --  1.7*  --  1.8*  PROT  --   --  4.5*  --  4.6*  ALBUMIN 1.7* 1.6* 1.5* 1.4* 1.5*   No results for input(s): LIPASE, AMYLASE in the last 168 hours. No results for input(s): AMMONIA in the last 168 hours.  CBC:  Recent Labs Lab 03/25/16 0500 03/26/16 0714 03/27/16 0629 03/28/16 0539 03/30/16 1617  WBC 13.5* 14.3* 13.2* 13.9* 19.5*  NEUTROABS  --   --  10.1* 10.9* 17.1*  HGB 7.5* 7.3* 7.5* 8.0* 7.5*  HCT 22.6* 21.9* 21.7* 23.7* 21.2*  MCV 88.6 88.0 86.8 87.1 85.1  PLT 298 302 299  301 255    Cardiac Enzymes: No results for input(s): CKTOTAL, CKMB, CKMBINDEX, TROPONINI in the last 168 hours.  BNP: Invalid input(s): POCBNP  CBG: No results for input(s): GLUCAP in the last 168 hours.  Microbiology: Results for orders placed or performed during the hospital encounter of 03/21/2016  Culture, Urine     Status: Abnormal   Collection Time: 03/08/16 11:20 PM  Result Value Ref Range Status   Specimen Description URINE, CLEAN CATCH  Final   Special Requests NONE  Final   Culture (A)  Final    >=100,000 COLONIES/mL ESCHERICHIA COLI Confirmed Extended Spectrum Beta-Lactamase Producer (ESBL)    Report Status 03/11/2016 FINAL  Final   Organism ID, Bacteria ESCHERICHIA COLI (A)  Final      Susceptibility   Escherichia coli - MIC*    AMPICILLIN >=32 RESISTANT Resistant     CEFAZOLIN >=64 RESISTANT Resistant     CEFTRIAXONE >=64 RESISTANT Resistant     CIPROFLOXACIN >=4 RESISTANT Resistant     GENTAMICIN <=1 SENSITIVE Sensitive     IMIPENEM <=0.25 SENSITIVE Sensitive     NITROFURANTOIN >=  512 RESISTANT Resistant     TRIMETH/SULFA <=20 SENSITIVE Sensitive     AMPICILLIN/SULBACTAM >=32 RESISTANT Resistant     PIP/TAZO <=4 SENSITIVE Sensitive     Extended ESBL POSITIVE Resistant     * >=100,000 COLONIES/mL ESCHERICHIA COLI  C difficile quick scan w PCR reflex     Status: None   Collection Time: 03/09/16  5:17 AM  Result Value Ref Range Status   C Diff antigen NEGATIVE NEGATIVE Final   C Diff toxin NEGATIVE NEGATIVE Final   C Diff interpretation No C. difficile detected.  Final  C difficile quick scan w PCR reflex     Status: None   Collection Time: 03/19/16  9:24 AM  Result Value Ref Range Status   C Diff antigen NEGATIVE NEGATIVE Final   C Diff toxin NEGATIVE NEGATIVE Final   C Diff interpretation No C. difficile detected.  Final  Culture, respiratory (NON-Expectorated)     Status: None   Collection Time: 03/19/16  2:42 PM  Result Value Ref Range Status    Specimen Description TRACHEAL ASPIRATE  Final   Special Requests NONE  Final   Gram Stain   Final    MODERATE WBC PRESENT, PREDOMINANTLY PMN FEW BUDDING YEAST SEEN    Culture MODERATE CANDIDA ALBICANS  Final   Report Status 03/21/2016 FINAL  Final  Culture, blood (routine x 2)     Status: Abnormal   Collection Time: 03/23/16  2:30 PM  Result Value Ref Range Status   Specimen Description BLOOD LEFT ARM  Final   Special Requests IN PEDIATRIC BOTTLE 3CC  Final   Culture  Setup Time   Final    AEROBIC BOTTLE ONLY GRAM POSITIVE COCCI IN CLUSTERS CRITICAL RESULT CALLED TO, READ BACK BY AND VERIFIED WITH: H. TATA, RN [facility]) AT 2000 ON 03/24/16 BY C. JESSUP, MLT.    Culture STAPHYLOCOCCUS SPECIES (COAGULASE NEGATIVE) (A)  Final   Report Status 03/26/2016 FINAL  Final   Organism ID, Bacteria STAPHYLOCOCCUS SPECIES (COAGULASE NEGATIVE)  Final      Susceptibility   Staphylococcus species (coagulase negative) - MIC*    CIPROFLOXACIN >=8 RESISTANT Resistant     ERYTHROMYCIN >=8 RESISTANT Resistant     GENTAMICIN >=16 RESISTANT Resistant     OXACILLIN >=4 RESISTANT Resistant     TETRACYCLINE 2 SENSITIVE Sensitive     VANCOMYCIN 1 SENSITIVE Sensitive     TRIMETH/SULFA 160 RESISTANT Resistant     CLINDAMYCIN >=8 RESISTANT Resistant     RIFAMPIN >=32 RESISTANT Resistant     Inducible Clindamycin NEGATIVE Sensitive     * STAPHYLOCOCCUS SPECIES (COAGULASE NEGATIVE)  Culture, blood (routine x 2)     Status: Abnormal   Collection Time: 03/23/16  2:40 PM  Result Value Ref Range Status   Specimen Description BLOOD RIGHT HAND  Final   Special Requests IN PEDIATRIC BOTTLE 3CC  Final   Culture  Setup Time   Final    GRAM POSITIVE COCCI IN CLUSTERS AEROBIC BOTTLE ONLY CRITICAL VALUE NOTED.  VALUE IS CONSISTENT WITH PREVIOUSLY REPORTED AND CALLED VALUE.    Culture (A)  Final    STAPHYLOCOCCUS SPECIES (COAGULASE NEGATIVE) SUSCEPTIBILITIES PERFORMED ON PREVIOUS CULTURE WITHIN THE LAST 5 DAYS.     Report Status 03/26/2016 FINAL  Final  Culture, blood (routine x 2)     Status: None (Preliminary result)   Collection Time: 03/29/16  1:55 PM  Result Value Ref Range Status   Specimen Description BLOOD LEFT ARM  Final  Special Requests IN PEDIATRIC BOTTLE 4CC  Final   Culture NO GROWTH 2 DAYS  Final   Report Status PENDING  Incomplete  Culture, blood (routine x 2)     Status: None (Preliminary result)   Collection Time: 03/29/16  2:15 PM  Result Value Ref Range Status   Specimen Description BLOOD LEFT ARM  Final   Special Requests IN PEDIATRIC BOTTLE 4CC  Final   Culture NO GROWTH 2 DAYS  Final   Report Status PENDING  Incomplete    Coagulation Studies:  Recent Labs  03/29/16 0500 03/30/16 1617  LABPROT 24.8* 26.5*  INR 2.20 2.39    Urinalysis: No results for input(s): COLORURINE, LABSPEC, PHURINE, GLUCOSEU, HGBUR, BILIRUBINUR, KETONESUR, PROTEINUR, UROBILINOGEN, NITRITE, LEUKOCYTESUR in the last 72 hours.  Invalid input(s): APPERANCEUR    Imaging: No results found.   Medications:       Assessment/ Plan:  82 y.o. male with a PMHx of Coronary artery disease status post 3 vessel CABG, hyperlipidemia, hypertension, malnutrition, chronic kidney disease stage IV [facility], atrial fibrillation  who was admitted to [facility] on 03/17/2016 for ongoing treatment of generalized debility, atrial fibrillation, pneumothorax, NSTEMI, anemia of chronic kidney disease was brought here for ongoing management. [facility]. He was felt to have a very overall poor prognosis. However family desired continued aggressive care.  1.  Acute renal failure. 2.  CKD stage IV, [facility], and evaluated by [facility] as recent inpt.  Patient felt not to be a candidate for dialysis by 2 independent nephrology groups. 3.  Pulmonary Hypertension, peak PA pressure 57 mm Hg, Rheumatic mitral valve disease, Mod to  severe Tricuspid regurgitation 4.  Atrial Fibrilliation with RVR 5.  Acute resp failure, large left pleural effusion, ventilator assisted 6.  Generalized Dependent Edema  Plan:  Patient remains critically ill at this point in time.  He remains on the ventilator and has underlying diastolic heart failure, moderate to severe pulmonary hypertension (which makes dialysis extremely difficult), acute respiratory failure, atrial fibrillation.  We are asked to come back to assess the patient and to help conduct family meeting.  Patient's family was present at the meeting today including his wife, 2 sons, and daughter.  We again advised the family regarding the patients overall condition.  He has been seen by 2 pulmonary critical care doctors while here who have deemed his care futile.  We reiterated that he is not a dialysis candidate, per our own evaluation as well as 2 prior independent nephrology evaluations.  The family inquired about continuous renal replacement therapy which we do not have [facility] at [facility].  I conferred with Dr. Abel Prestoolodonato again today (who previously assessed the patient [facility]) upon request from the family, and Dr. Abel Prestoolodonato again assessed that the patient isn't a candidate for any form of dialysis including continuous renal replacement therapy after I updated him on the patient's current condition.  I discussed this with Dr. Sharyon MedicusHijazi and he is currently pursuing referral to another teritary care center to see if they will accept the patient for continuous renal replacement therapy.  Over one hour was spent in assessing the care of the patient and in conducting the family meeting.  We wish the patient and his family the best.     LOS: 0 Taurus Willis 10/6/20174:37 PM

## 2016-04-01 LAB — HEPATIC FUNCTION PANEL
ALBUMIN: 1.4 g/dL — AB (ref 3.5–5.0)
ALT: 11 U/L — ABNORMAL LOW (ref 17–63)
AST: 41 U/L (ref 15–41)
Alkaline Phosphatase: 475 U/L — ABNORMAL HIGH (ref 38–126)
BILIRUBIN INDIRECT: 0.8 mg/dL (ref 0.3–0.9)
Bilirubin, Direct: 1.6 mg/dL — ABNORMAL HIGH (ref 0.1–0.5)
TOTAL PROTEIN: 4.5 g/dL — AB (ref 6.5–8.1)
Total Bilirubin: 2.4 mg/dL — ABNORMAL HIGH (ref 0.3–1.2)

## 2016-04-02 ENCOUNTER — Other Ambulatory Visit (HOSPITAL_COMMUNITY): Payer: Medicare Other

## 2016-04-02 LAB — CBC WITH DIFFERENTIAL/PLATELET
BASOS ABS: 0 10*3/uL (ref 0.0–0.1)
Basophils Relative: 0 %
Eosinophils Absolute: 0 10*3/uL (ref 0.0–0.7)
Eosinophils Relative: 0 %
HCT: 22.5 % — ABNORMAL LOW (ref 39.0–52.0)
Hemoglobin: 7.7 g/dL — ABNORMAL LOW (ref 13.0–17.0)
LYMPHS ABS: 0.9 10*3/uL (ref 0.7–4.0)
LYMPHS PCT: 5 %
MCH: 28.8 pg (ref 26.0–34.0)
MCHC: 34.2 g/dL (ref 30.0–36.0)
MCV: 84.3 fL (ref 78.0–100.0)
MONOS PCT: 3 %
Monocytes Absolute: 0.6 10*3/uL (ref 0.1–1.0)
NEUTROS PCT: 92 %
Neutro Abs: 17.2 10*3/uL — ABNORMAL HIGH (ref 1.7–7.7)
PLATELETS: 257 10*3/uL (ref 150–400)
RBC: 2.67 MIL/uL — AB (ref 4.22–5.81)
RDW: 14.7 % (ref 11.5–15.5)
WBC: 18.7 10*3/uL — AB (ref 4.0–10.5)

## 2016-04-02 LAB — RENAL FUNCTION PANEL
Albumin: 1.5 g/dL — ABNORMAL LOW (ref 3.5–5.0)
Anion gap: 17 — ABNORMAL HIGH (ref 5–15)
BUN: 159 mg/dL — ABNORMAL HIGH (ref 6–20)
CALCIUM: 7.8 mg/dL — AB (ref 8.9–10.3)
CHLORIDE: 77 mmol/L — AB (ref 101–111)
CO2: 15 mmol/L — ABNORMAL LOW (ref 22–32)
CREATININE: 6.8 mg/dL — AB (ref 0.61–1.24)
GFR, EST AFRICAN AMERICAN: 8 mL/min — AB (ref >60)
GFR, EST NON AFRICAN AMERICAN: 6 mL/min — AB (ref >60)
Glucose, Bld: 106 mg/dL — ABNORMAL HIGH (ref 65–99)
Phosphorus: 9.8 mg/dL — ABNORMAL HIGH (ref 2.5–4.6)
Potassium: 5.5 mmol/L — ABNORMAL HIGH (ref 3.5–5.1)
Sodium: 109 mmol/L — CL (ref 135–145)

## 2016-04-02 LAB — BRAIN NATRIURETIC PEPTIDE: B NATRIURETIC PEPTIDE 5: 423.6 pg/mL — AB (ref 0.0–100.0)

## 2016-04-02 LAB — MAGNESIUM: MAGNESIUM: 2.1 mg/dL (ref 1.7–2.4)

## 2016-04-03 LAB — CULTURE, BLOOD (ROUTINE X 2)
Culture: NO GROWTH
Culture: NO GROWTH

## 2016-04-03 LAB — MAGNESIUM: Magnesium: 1.9 mg/dL (ref 1.7–2.4)

## 2016-04-03 LAB — COMPREHENSIVE METABOLIC PANEL
ALBUMIN: 1.4 g/dL — AB (ref 3.5–5.0)
ALK PHOS: 905 U/L — AB (ref 38–126)
ALT: 15 U/L — ABNORMAL LOW (ref 17–63)
ANION GAP: 18 — AB (ref 5–15)
AST: 69 U/L — ABNORMAL HIGH (ref 15–41)
BUN: 165 mg/dL — ABNORMAL HIGH (ref 6–20)
CALCIUM: 7.4 mg/dL — AB (ref 8.9–10.3)
CO2: 11 mmol/L — AB (ref 22–32)
Chloride: 77 mmol/L — ABNORMAL LOW (ref 101–111)
Creatinine, Ser: 6.88 mg/dL — ABNORMAL HIGH (ref 0.61–1.24)
GFR calc Af Amer: 7 mL/min — ABNORMAL LOW (ref >60)
GFR calc non Af Amer: 6 mL/min — ABNORMAL LOW (ref >60)
Glucose, Bld: 145 mg/dL — ABNORMAL HIGH (ref 65–99)
Potassium: 5.6 mmol/L — ABNORMAL HIGH (ref 3.5–5.1)
SODIUM: 106 mmol/L — AB (ref 135–145)
TOTAL PROTEIN: 4.2 g/dL — AB (ref 6.5–8.1)
Total Bilirubin: 3.8 mg/dL — ABNORMAL HIGH (ref 0.3–1.2)

## 2016-04-03 LAB — TRIGLYCERIDES: Triglycerides: 59 mg/dL (ref <150)

## 2016-04-03 LAB — PHOSPHORUS: Phosphorus: 9.8 mg/dL — ABNORMAL HIGH (ref 2.5–4.6)

## 2016-04-04 LAB — RENAL FUNCTION PANEL
ANION GAP: 17 — AB (ref 5–15)
Albumin: 1.3 g/dL — ABNORMAL LOW (ref 3.5–5.0)
BUN: 160 mg/dL — AB (ref 6–20)
CHLORIDE: 73 mmol/L — AB (ref 101–111)
CO2: 12 mmol/L — AB (ref 22–32)
Calcium: 7.4 mg/dL — ABNORMAL LOW (ref 8.9–10.3)
Creatinine, Ser: 7.02 mg/dL — ABNORMAL HIGH (ref 0.61–1.24)
GFR calc Af Amer: 7 mL/min — ABNORMAL LOW (ref >60)
GFR calc non Af Amer: 6 mL/min — ABNORMAL LOW (ref >60)
GLUCOSE: 154 mg/dL — AB (ref 65–99)
PHOSPHORUS: 10.6 mg/dL — AB (ref 2.5–4.6)
POTASSIUM: 5.8 mmol/L — AB (ref 3.5–5.1)
Sodium: 102 mmol/L — CL (ref 135–145)

## 2016-04-04 LAB — CBC WITH DIFFERENTIAL/PLATELET
BASOS PCT: 0 %
Basophils Absolute: 0 10*3/uL (ref 0.0–0.1)
EOS ABS: 0 10*3/uL (ref 0.0–0.7)
EOS PCT: 0 %
HEMATOCRIT: 19.5 % — AB (ref 39.0–52.0)
Hemoglobin: 7 g/dL — ABNORMAL LOW (ref 13.0–17.0)
LYMPHS ABS: 0.5 10*3/uL — AB (ref 0.7–4.0)
Lymphocytes Relative: 3 %
MCH: 29.4 pg (ref 26.0–34.0)
MCHC: 35.9 g/dL (ref 30.0–36.0)
MCV: 81.9 fL (ref 78.0–100.0)
MONO ABS: 0.5 10*3/uL (ref 0.1–1.0)
Monocytes Relative: 3 %
NEUTROS PCT: 94 %
Neutro Abs: 15.9 10*3/uL — ABNORMAL HIGH (ref 1.7–7.7)
PLATELETS: 154 10*3/uL (ref 150–400)
RBC: 2.38 MIL/uL — ABNORMAL LOW (ref 4.22–5.81)
RDW: 14.4 % (ref 11.5–15.5)
WBC: 16.9 10*3/uL — AB (ref 4.0–10.5)

## 2016-04-04 LAB — MAGNESIUM: Magnesium: 2.1 mg/dL (ref 1.7–2.4)

## 2016-04-05 ENCOUNTER — Other Ambulatory Visit (HOSPITAL_COMMUNITY): Payer: Medicare Other

## 2016-04-05 LAB — CBC WITH DIFFERENTIAL/PLATELET
BASOS PCT: 0 %
Basophils Absolute: 0 10*3/uL (ref 0.0–0.1)
Eosinophils Absolute: 0 10*3/uL (ref 0.0–0.7)
Eosinophils Relative: 0 %
HEMATOCRIT: 18.7 % — AB (ref 39.0–52.0)
HEMOGLOBIN: 6.9 g/dL — AB (ref 13.0–17.0)
LYMPHS ABS: 0.2 10*3/uL — AB (ref 0.7–4.0)
LYMPHS PCT: 3 %
MCH: 29.9 pg (ref 26.0–34.0)
MCHC: 36.9 g/dL — AB (ref 30.0–36.0)
MCV: 81 fL (ref 78.0–100.0)
MONOS PCT: 4 %
Monocytes Absolute: 0.3 10*3/uL (ref 0.1–1.0)
NEUTROS ABS: 7.1 10*3/uL (ref 1.7–7.7)
Neutrophils Relative %: 93 %
Platelets: 114 10*3/uL — ABNORMAL LOW (ref 150–400)
RBC: 2.31 MIL/uL — ABNORMAL LOW (ref 4.22–5.81)
RDW: 14.3 % (ref 11.5–15.5)
WBC MORPHOLOGY: INCREASED
WBC: 7.6 10*3/uL (ref 4.0–10.5)

## 2016-04-05 LAB — BLOOD GAS, VENOUS
ACID-BASE DEFICIT: 17.9 mmol/L — AB (ref 0.0–2.0)
BICARBONATE: 9.7 mmol/L — AB (ref 20.0–28.0)
FIO2: 1
MECHVT: 500 mL
O2 SAT: 38 %
PATIENT TEMPERATURE: 98.6
PCO2 VEN: 31.9 mmHg — AB (ref 44.0–60.0)
PEEP/CPAP: 5 cmH2O
PH VEN: 7.111 — AB (ref 7.250–7.430)
pO2, Ven: 33.8 mmHg (ref 32.0–45.0)

## 2016-04-05 LAB — RENAL FUNCTION PANEL
Albumin: 1.1 g/dL — ABNORMAL LOW (ref 3.5–5.0)
BUN: 158 mg/dL — ABNORMAL HIGH (ref 6–20)
CALCIUM: 7.1 mg/dL — AB (ref 8.9–10.3)
CHLORIDE: 70 mmol/L — AB (ref 101–111)
CO2: 9 mmol/L — AB (ref 22–32)
Creatinine, Ser: 6.97 mg/dL — ABNORMAL HIGH (ref 0.61–1.24)
GFR calc non Af Amer: 6 mL/min — ABNORMAL LOW (ref >60)
GFR, EST AFRICAN AMERICAN: 7 mL/min — AB (ref >60)
Glucose, Bld: 97 mg/dL (ref 65–99)
POTASSIUM: 5.9 mmol/L — AB (ref 3.5–5.1)
Phosphorus: 11.9 mg/dL — ABNORMAL HIGH (ref 2.5–4.6)
Sodium: <102 mmol/L — CL (ref 135–145)

## 2016-04-05 LAB — MAGNESIUM: MAGNESIUM: 2.1 mg/dL (ref 1.7–2.4)

## 2016-04-05 LAB — BRAIN NATRIURETIC PEPTIDE: B Natriuretic Peptide: 506.4 pg/mL — ABNORMAL HIGH (ref 0.0–100.0)

## 2016-04-05 MED FILL — Medication: Qty: 1 | Status: AC

## 2016-04-26 DEATH — deceased

## 2017-09-28 IMAGING — DX DG CHEST 2V
3 series · 3 of 3 positions shown · non-contrast
Comparison: 03/01/2016

CLINICAL DATA: Left-sided pleural effusion

EXAM:
CHEST  2 VIEW

[w chest decub (1 of 2)]
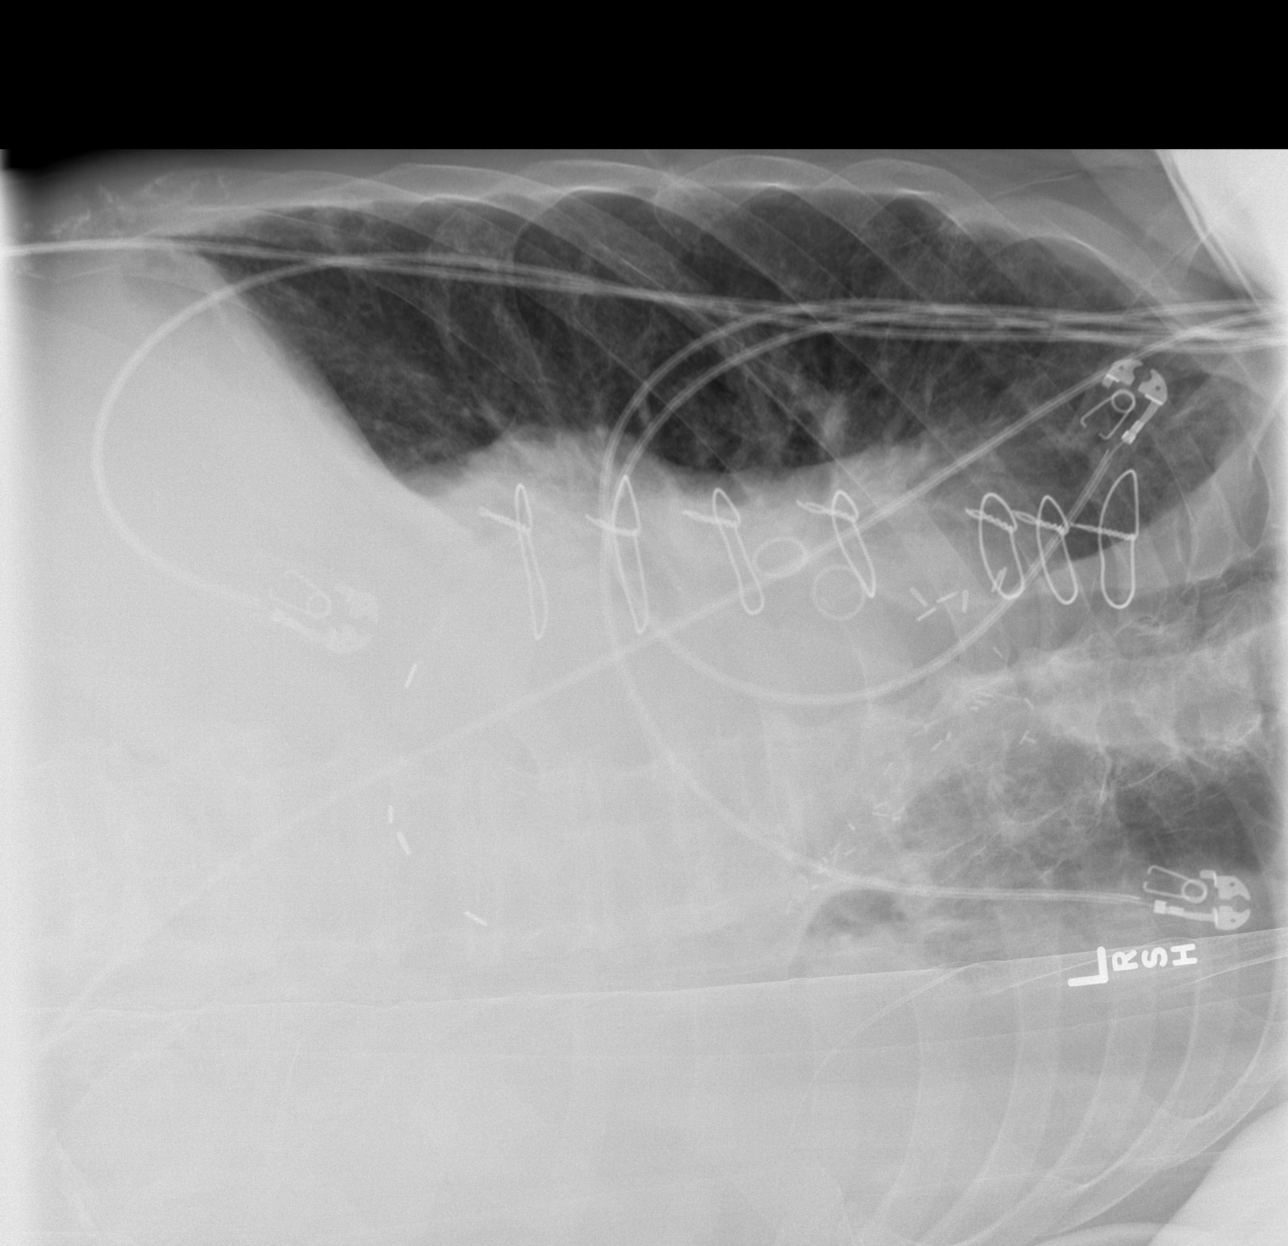

[w chest decub (2 of 2)]
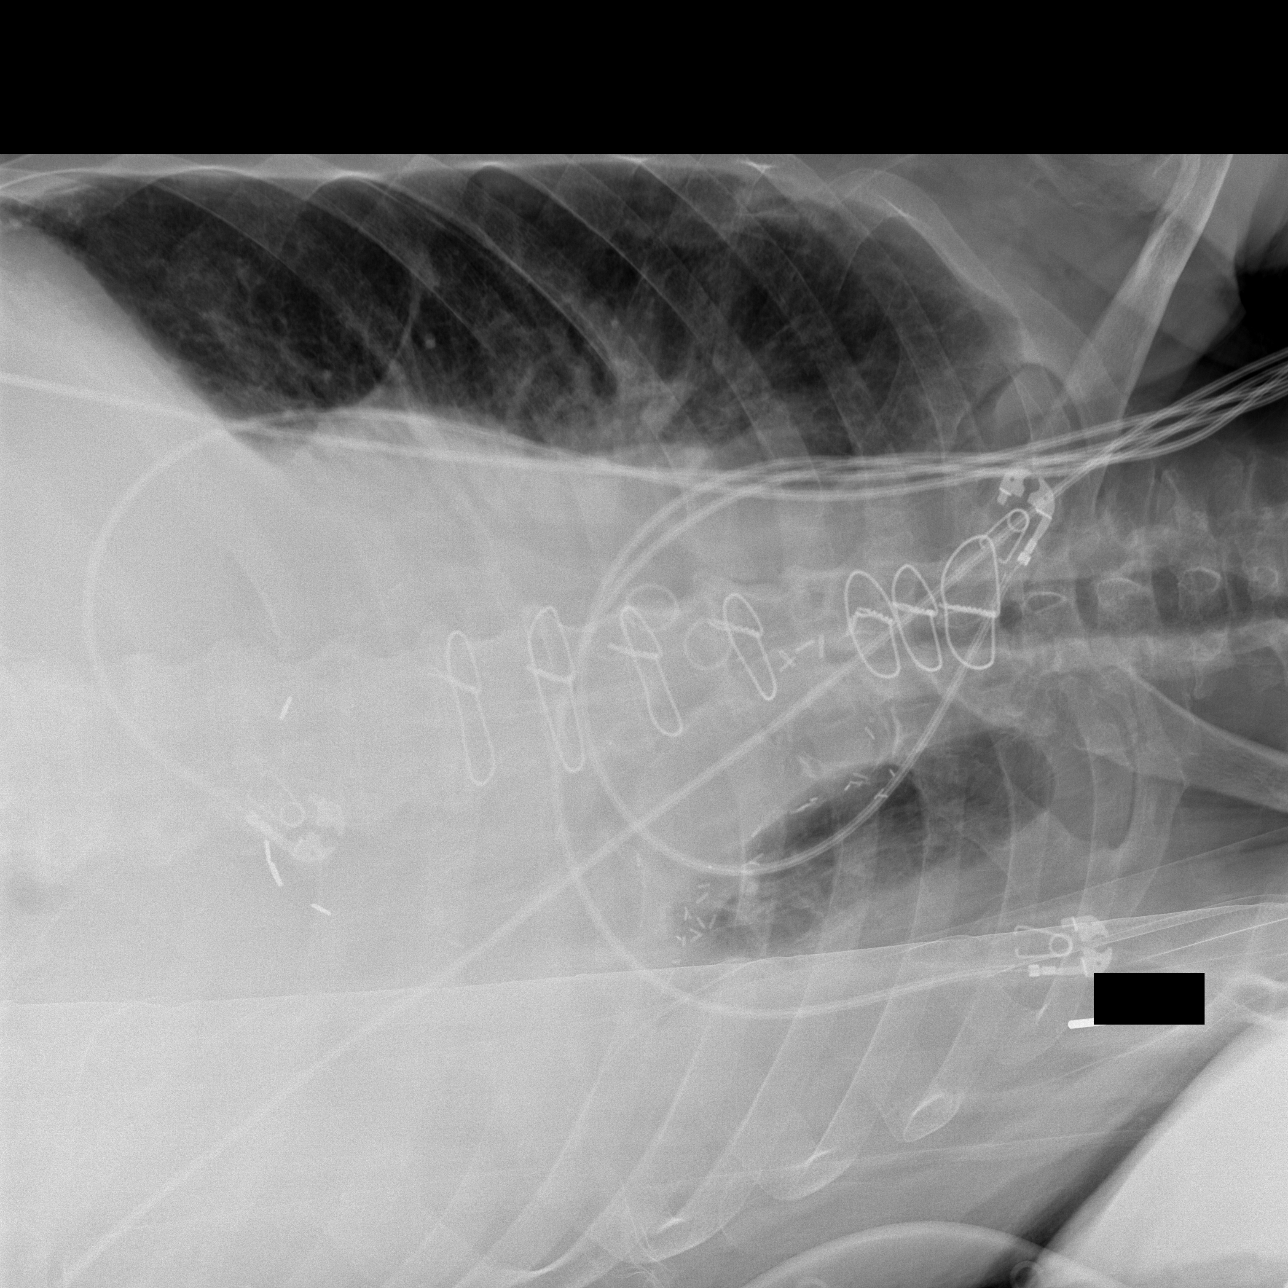

[x chest ap]
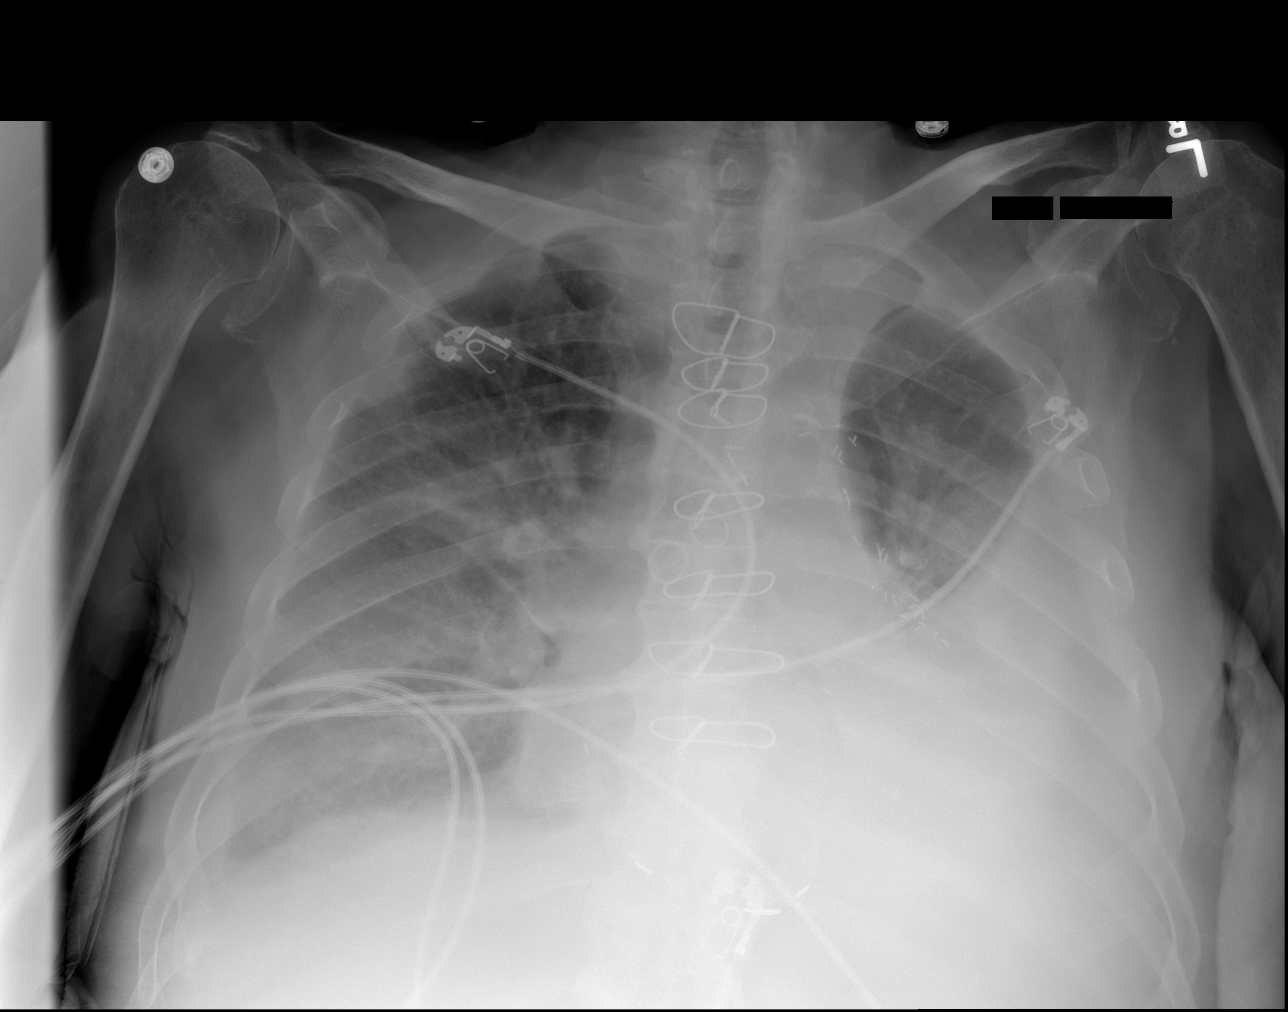

[3 of 3 positions shown; findings below may reference images not displayed]

FINDINGS: Cardiac shadow is stable. Postoperative changes are again seen. A
large left pleural effusion is again noted. It is at least partially
mobile. The small right effusion also appears somewhat mobile.
IMPRESSION: Bilateral pleural effusions left greater than right with mobile
components bilaterally. Degree of central vascular congestion
appears somewhat worse than that seen on the prior exam.

## 2017-09-28 IMAGING — DX DG CHEST 1V
1 series · 1 of 1 positions shown · non-contrast
Comparison: Earlier film of the same day

CLINICAL DATA: Left side pleural effusion. Post Thorocentesis on
the left. Hx of CAD, HTN, NSTEMI, Pneumonia, DM2

EXAM:
CHEST  1 VIEW

[x chest ap]
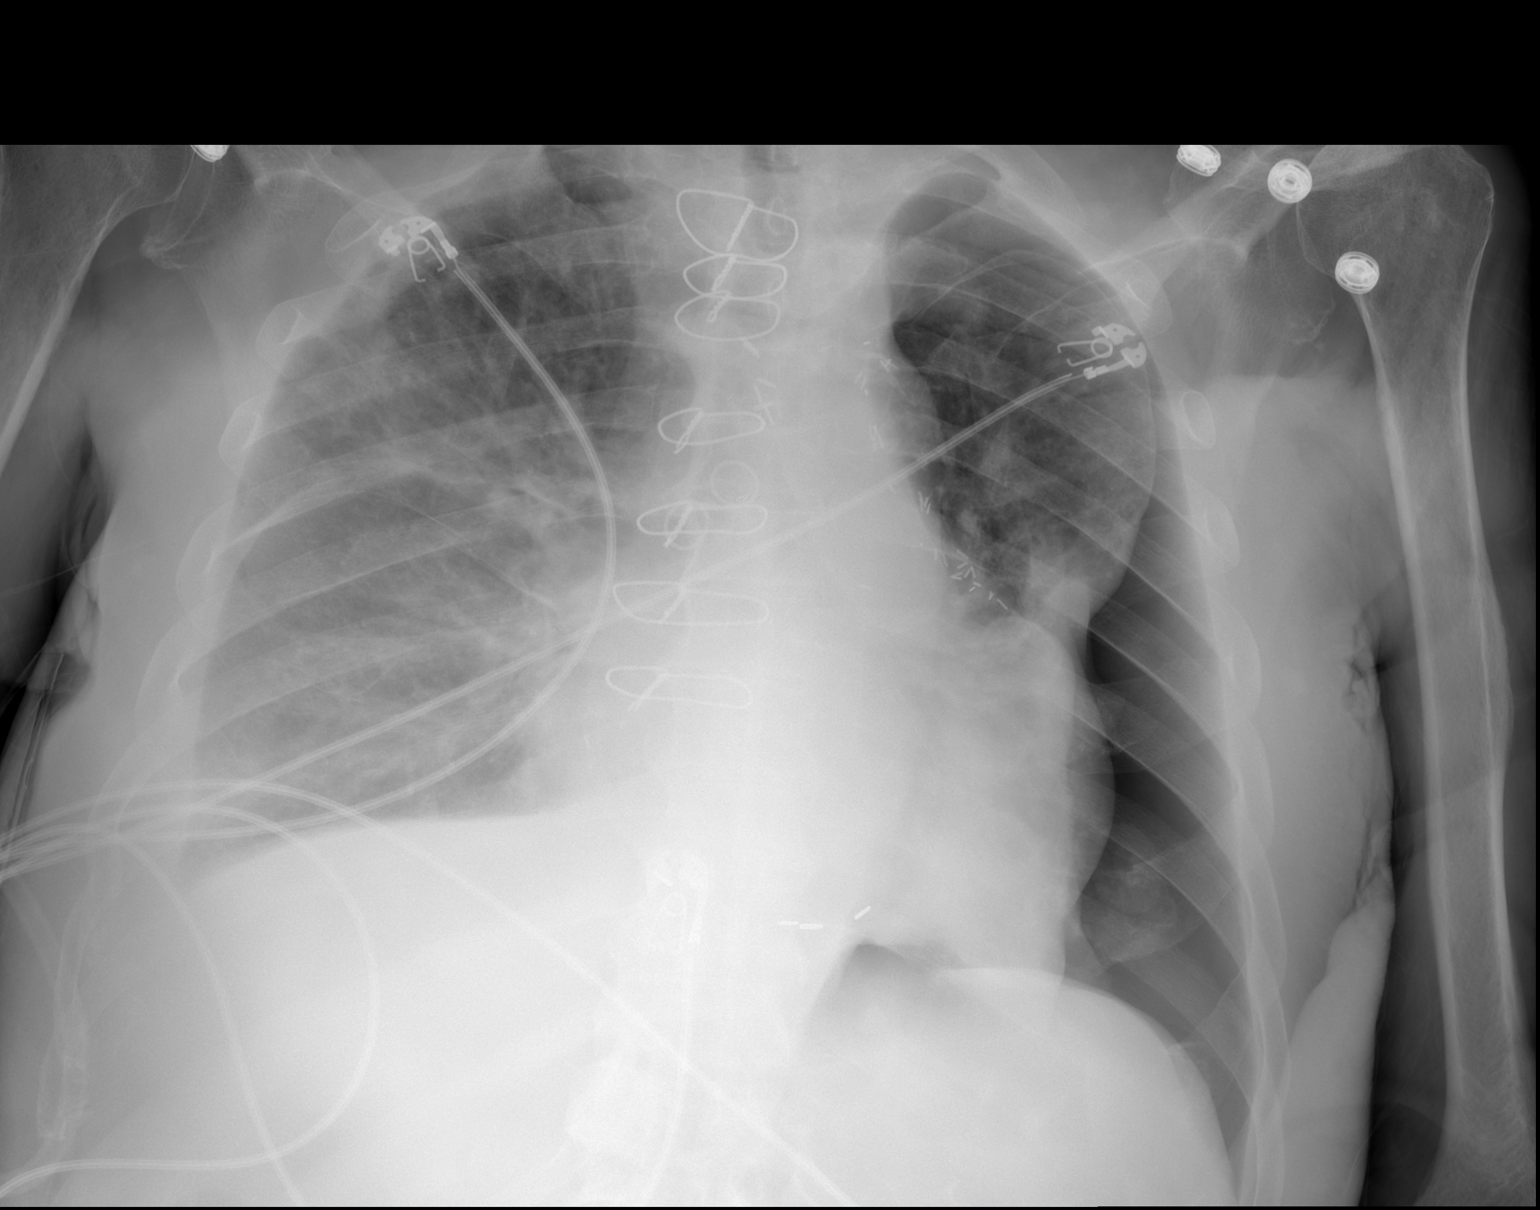

[1 of 1 positions shown; findings below may reference images not displayed]

FINDINGS: Moderate left lateral and apical pneumothorax, without midline
shift. Resolution of pleural effusion. Degree of left lung inflation
is slightly improved from prior exam.

Probable layering right pleural effusion as before with stable
opacities in the mid and lower right lung.

Heart size upper limits normal.  Previous CABG.
IMPRESSION: 1. Moderate left pleural effusion post thoracentesis, with no
significant residual effusion, and overall slightly improved
aeration of the left lung.

## 2017-09-29 IMAGING — XA IR CHEST FLUORO
1 series · 1 of 1 positions shown · non-contrast
Comparison: none

INDICATION: 88-year-old male with a history of pneumothorax

[Series 1: fl (-) angio · 1 of 1 slices shown]
[im 1/1]
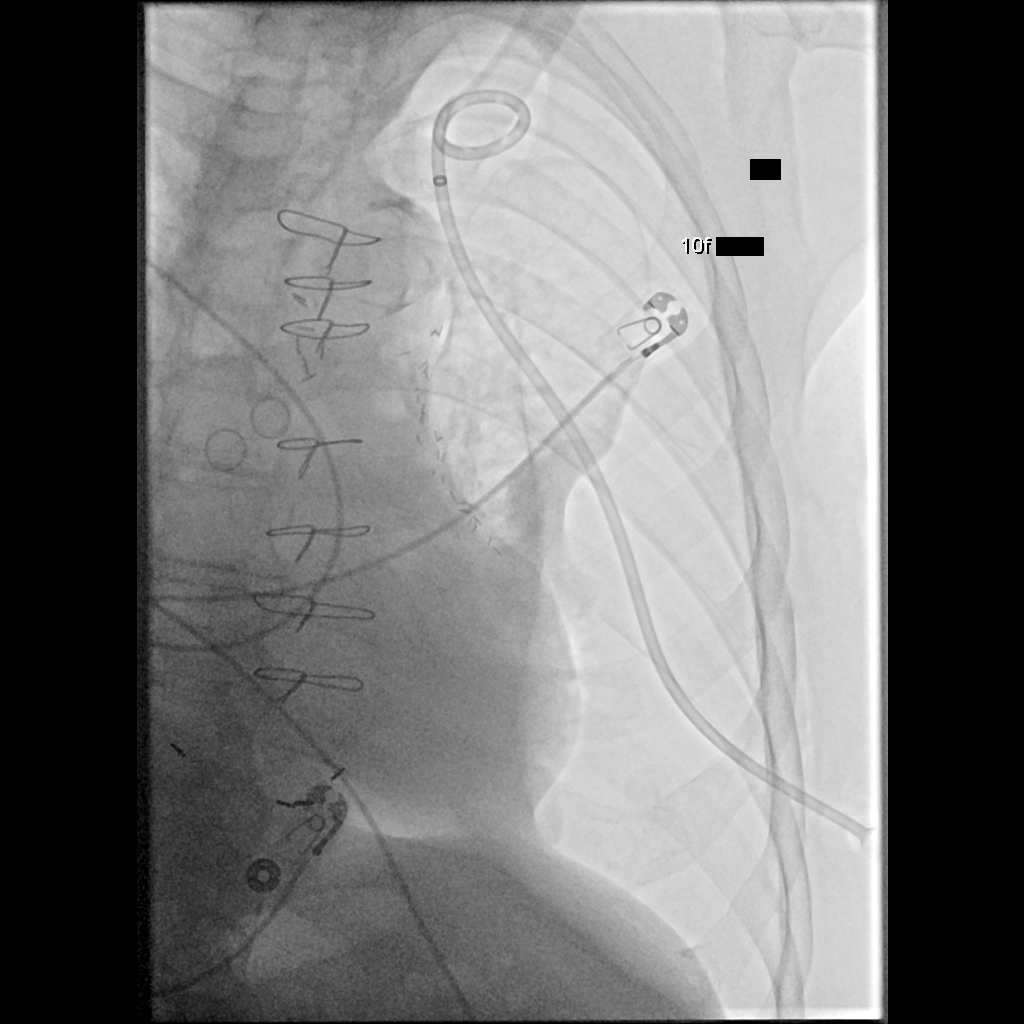

[1 of 1 positions shown; findings below may reference images not displayed]

EXAM:
CHEST FLUOROSCOPY

MEDICATIONS:
The patient is currently admitted to the hospital and receiving
intravenous antibiotics. The antibiotics were administered within an
appropriate time frame prior to the initiation of the procedure.

ANESTHESIA/SEDATION:
Fentanyl 25 mcg IV; Versed 0 mg IV

COMPLICATIONS:
None

PROCEDURE:
Informed written consent was obtained from the patient after a
thorough discussion of the procedural risks, benefits and
alternatives. All questions were addressed. Maximal Sterile Barrier
Technique was utilized including caps, mask, sterile gowns, sterile
gloves, sterile drape, hand hygiene and skin antiseptic. A timeout
was performed prior to the initiation of the procedure.

Patient positioned supine position on the fluoroscopy table. Left
chest was prepped and draped in the usual sterile fashion.

Scout image was acquired.

The skin and subcutaneous tissues at the anterior axillary line just
below the nipple was generously infiltrated 1% lidocaine for local
anesthesia. A Yueh needle was advanced under aspiration into the
left chest. Catheter was advanced from the needle. A 035 wire was
placed through the needle into the chest under fluoroscopy. The
catheter was removed. Small incision was made on the wire. It is
dilation of the soft tissues was performed and then a 10 French
drain was placed with modified Seldinger technique. Pigtail was
formed can the drain was sutured in position. Final image was
stored.

The catheter was then attached to a water seal chamber on suction.

Patient tolerated the procedure well and remained hemodynamically
stable throughout.

No complications were encountered and no significant blood loss
encountered.
IMPRESSION: Status post left thoracostomy tube placement.

## 2017-09-29 IMAGING — CR DG CHEST 1V PORT
1 series · 1 of 1 positions shown · non-contrast
Comparison: 03/02/2016

CLINICAL DATA: Short of breath

EXAM:
PORTABLE CHEST 1 VIEW

[AP]
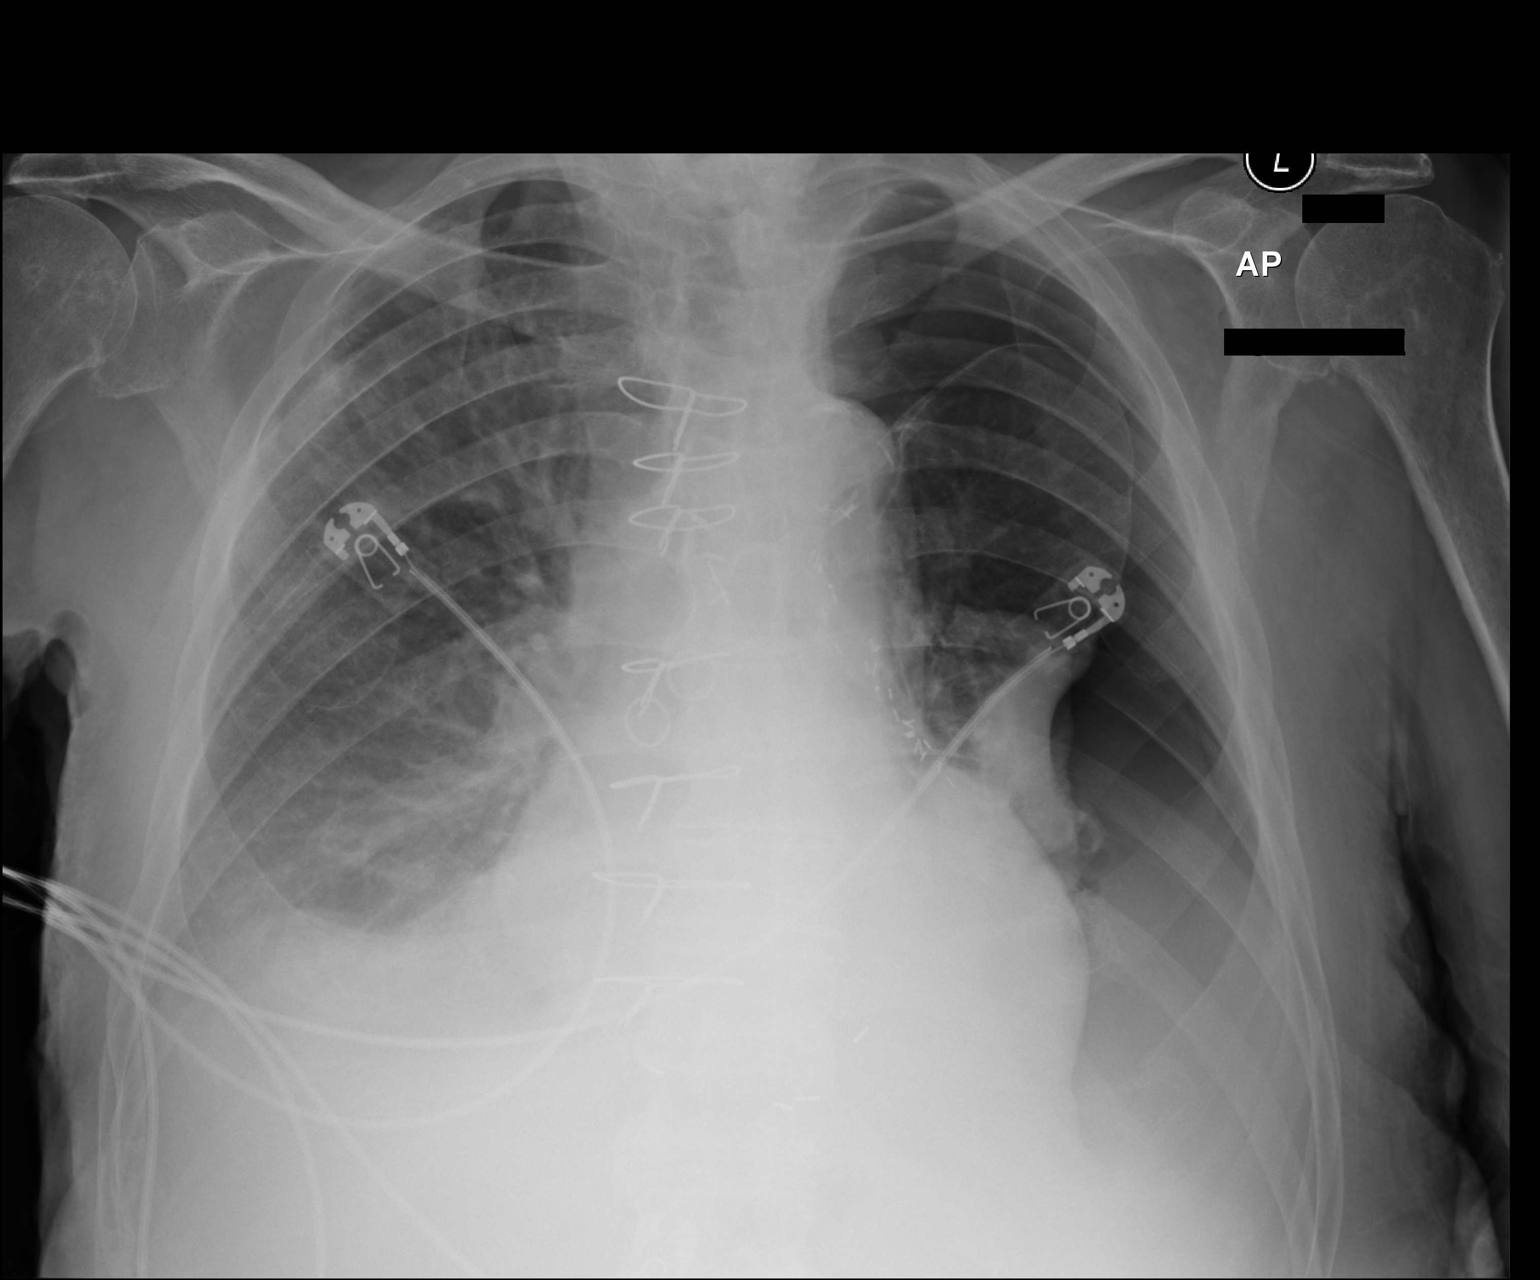

[1 of 1 positions shown; findings below may reference images not displayed]

FINDINGS: Sternotomy wires overlie normal cardiac silhouette. Large LEFT
pneumothorax again demonstrated. The LEFT upper pleural edge
measures 48 mm mid chest wall increased from 27 mm. The lateral
pleural edge measures 47 mm compared to 44 remeasured. Minimal
expansion of the LEFT lung. Interval increase in LEFT effusion
compared to 1 day prior. No clear expansion of the LEFT lung.
IMPRESSION: 1. Large LEFT pneumothorax measures slightly larger than comparison
exam. Note mediastinal shift.
2. No expansion of the LEFT lung.
3. Interval reaccumulation of LEFT pleural fluid.
4. Stable RIGHT effusion
These results will be called to the ordering clinician or
representative by the Radiologist Assistant, and communication
documented in the PACS or zVision Dashboard.
